# Patient Record
Sex: Male | Born: 1940 | Race: White | Hispanic: No | Smoking: Former smoker
Health system: Southern US, Community
[De-identification: ages and names within clinical notes are randomized; demographics above are authoritative.]

## PROBLEM LIST (undated history)

## (undated) DIAGNOSIS — F0390 Unspecified dementia without behavioral disturbance: Secondary | ICD-10-CM

## (undated) DIAGNOSIS — Z87898 Personal history of other specified conditions: Secondary | ICD-10-CM

## (undated) DIAGNOSIS — I4891 Unspecified atrial fibrillation: Secondary | ICD-10-CM

## (undated) DIAGNOSIS — F32A Depression, unspecified: Secondary | ICD-10-CM

## (undated) DIAGNOSIS — N400 Enlarged prostate without lower urinary tract symptoms: Secondary | ICD-10-CM

## (undated) DIAGNOSIS — K219 Gastro-esophageal reflux disease without esophagitis: Secondary | ICD-10-CM

## (undated) DIAGNOSIS — E785 Hyperlipidemia, unspecified: Secondary | ICD-10-CM

## (undated) DIAGNOSIS — T8859XA Other complications of anesthesia, initial encounter: Secondary | ICD-10-CM

## (undated) DIAGNOSIS — I10 Essential (primary) hypertension: Secondary | ICD-10-CM

## (undated) DIAGNOSIS — M1990 Unspecified osteoarthritis, unspecified site: Secondary | ICD-10-CM

## (undated) DIAGNOSIS — Z860100 Personal history of colon polyps, unspecified: Secondary | ICD-10-CM

## (undated) DIAGNOSIS — F419 Anxiety disorder, unspecified: Secondary | ICD-10-CM

## (undated) DIAGNOSIS — A809 Acute poliomyelitis, unspecified: Secondary | ICD-10-CM

## (undated) DIAGNOSIS — I359 Nonrheumatic aortic valve disorder, unspecified: Secondary | ICD-10-CM

## (undated) DIAGNOSIS — M542 Cervicalgia: Secondary | ICD-10-CM

## (undated) DIAGNOSIS — I4819 Other persistent atrial fibrillation: Secondary | ICD-10-CM

## (undated) DIAGNOSIS — G8929 Other chronic pain: Secondary | ICD-10-CM

## (undated) DIAGNOSIS — H269 Unspecified cataract: Secondary | ICD-10-CM

## (undated) DIAGNOSIS — C61 Malignant neoplasm of prostate: Secondary | ICD-10-CM

## (undated) DIAGNOSIS — R011 Cardiac murmur, unspecified: Secondary | ICD-10-CM

## (undated) HISTORY — DX: Personal history of colon polyps, unspecified: Z86.0100

## (undated) HISTORY — DX: Hyperlipidemia, unspecified: E78.5

## (undated) HISTORY — DX: Benign prostatic hyperplasia without lower urinary tract symptoms: N40.0

## (undated) HISTORY — DX: Unspecified atrial fibrillation: I48.91

## (undated) HISTORY — DX: Unspecified osteoarthritis, unspecified site: M19.90

## (undated) HISTORY — PX: CARDIAC VALVE REPLACEMENT: SHX585

## (undated) HISTORY — DX: Malignant neoplasm of prostate: C61

## (undated) HISTORY — DX: Other chronic pain: G89.29

## (undated) HISTORY — DX: Essential (primary) hypertension: I10

## (undated) HISTORY — DX: Gastro-esophageal reflux disease without esophagitis: K21.9

## (undated) HISTORY — DX: Nonrheumatic aortic valve disorder, unspecified: I35.9

## (undated) HISTORY — DX: Depression, unspecified: F32.A

## (undated) HISTORY — DX: Acute poliomyelitis, unspecified: A80.9

## (undated) HISTORY — DX: Other persistent atrial fibrillation: I48.19

## (undated) HISTORY — DX: Anxiety disorder, unspecified: F41.9

## (undated) HISTORY — PX: EYE SURGERY: SHX253

## (undated) HISTORY — DX: Cardiac murmur, unspecified: R01.1

## (undated) HISTORY — DX: Unspecified cataract: H26.9

## (undated) HISTORY — DX: Personal history of colonic polyps: Z86.010

## (undated) HISTORY — DX: Cervicalgia: M54.2

---

## 1988-10-29 HISTORY — PX: HERNIA REPAIR: SHX51

## 2000-10-29 HISTORY — PX: INSERTION PROSTATE RADIATION SEED: SUR718

## 2012-09-28 HISTORY — PX: AORTIC VALVE REPLACEMENT: SHX41

## 2012-10-09 DIAGNOSIS — I350 Nonrheumatic aortic (valve) stenosis: Secondary | ICD-10-CM | POA: Insufficient documentation

## 2012-10-14 DIAGNOSIS — I509 Heart failure, unspecified: Secondary | ICD-10-CM | POA: Insufficient documentation

## 2012-10-16 DIAGNOSIS — F1010 Alcohol abuse, uncomplicated: Secondary | ICD-10-CM | POA: Insufficient documentation

## 2012-10-17 DIAGNOSIS — Z952 Presence of prosthetic heart valve: Secondary | ICD-10-CM | POA: Insufficient documentation

## 2012-10-17 DIAGNOSIS — E861 Hypovolemia: Secondary | ICD-10-CM | POA: Insufficient documentation

## 2012-10-17 DIAGNOSIS — D62 Acute posthemorrhagic anemia: Secondary | ICD-10-CM | POA: Insufficient documentation

## 2012-10-18 DIAGNOSIS — D689 Coagulation defect, unspecified: Secondary | ICD-10-CM | POA: Insufficient documentation

## 2012-10-18 DIAGNOSIS — R579 Shock, unspecified: Secondary | ICD-10-CM | POA: Insufficient documentation

## 2013-10-30 LAB — HM COLONOSCOPY

## 2016-05-29 LAB — LIPID PANEL
LDL CALC: 69 mg/dL
Triglycerides: 224 mg/dL — AB (ref 40–160)

## 2016-09-28 HISTORY — PX: CARDIOVERSION: SHX1299

## 2017-01-14 ENCOUNTER — Encounter: Payer: Self-pay | Admitting: Family Medicine

## 2017-01-14 ENCOUNTER — Ambulatory Visit (INDEPENDENT_AMBULATORY_CARE_PROVIDER_SITE_OTHER): Payer: Medicare Other | Admitting: Family Medicine

## 2017-01-14 VITALS — BP 130/76 | HR 58 | Temp 98.0°F | Ht 69.0 in | Wt 188.6 lb

## 2017-01-14 DIAGNOSIS — E785 Hyperlipidemia, unspecified: Secondary | ICD-10-CM

## 2017-01-14 DIAGNOSIS — D689 Coagulation defect, unspecified: Secondary | ICD-10-CM

## 2017-01-14 DIAGNOSIS — I4819 Other persistent atrial fibrillation: Secondary | ICD-10-CM

## 2017-01-14 DIAGNOSIS — G8929 Other chronic pain: Secondary | ICD-10-CM | POA: Diagnosis not present

## 2017-01-14 DIAGNOSIS — N401 Enlarged prostate with lower urinary tract symptoms: Secondary | ICD-10-CM

## 2017-01-14 DIAGNOSIS — L719 Rosacea, unspecified: Secondary | ICD-10-CM

## 2017-01-14 DIAGNOSIS — C61 Malignant neoplasm of prostate: Secondary | ICD-10-CM | POA: Diagnosis not present

## 2017-01-14 DIAGNOSIS — N138 Other obstructive and reflux uropathy: Secondary | ICD-10-CM | POA: Diagnosis not present

## 2017-01-14 DIAGNOSIS — M542 Cervicalgia: Secondary | ICD-10-CM | POA: Diagnosis not present

## 2017-01-14 DIAGNOSIS — M47812 Spondylosis without myelopathy or radiculopathy, cervical region: Secondary | ICD-10-CM | POA: Insufficient documentation

## 2017-01-14 DIAGNOSIS — M2550 Pain in unspecified joint: Secondary | ICD-10-CM | POA: Insufficient documentation

## 2017-01-14 DIAGNOSIS — I509 Heart failure, unspecified: Secondary | ICD-10-CM

## 2017-01-14 DIAGNOSIS — Z8546 Personal history of malignant neoplasm of prostate: Secondary | ICD-10-CM | POA: Insufficient documentation

## 2017-01-14 DIAGNOSIS — I481 Persistent atrial fibrillation: Secondary | ICD-10-CM

## 2017-01-14 DIAGNOSIS — K219 Gastro-esophageal reflux disease without esophagitis: Secondary | ICD-10-CM | POA: Insufficient documentation

## 2017-01-14 LAB — POCT INR: INR: 4.8

## 2017-01-14 NOTE — Progress Notes (Signed)
Pre visit review using our clinic review tool, if applicable. No additional management support is needed unless otherwise documented below in the visit note. 

## 2017-01-14 NOTE — Patient Instructions (Addendum)
MON TUES WED THURS FRI SAT SUN  HOLD 2.5 5 5 5  2.5 5

## 2017-01-14 NOTE — Progress Notes (Signed)
Marcus Galloway is a 76 y.o. male is here to [employer].   History of Present Illness:   Water quality scientist, CMA, acting as scribe for Dr. [name].  CC: Patient is here to establish care.  Recently moved here from [facility], [place].  Patient has persistent atrial fibrillation and is currently taking warfarin for this.  No complaints today.  Would like INR checked.  HPI:  1. Persistent atrial fibrillation ([place]). On Coumadin. Started four weeks ago per patient. Needs INR checked today. Cardiovascular ROS: negative for - chest pain, dyspnea on exertion, edema or rapid heart rate. Neurological ROS: no TIA or stroke symptoms. Hematological and Lymphatic ROS: negative for - bleeding problems, bruising, jaundice, night sweats, pallor or weight loss.   Health Maintenance Due  Topic Date Due  . PNA vac Low Risk Adult (1 of 2 - PCV13) 05/23/2005    PMHx, SurgHx, SocialHx, Medications, and Allergies were reviewed in the Visit Navigator and updated as appropriate.   Past Medical History:  Diagnosis Date  . Aortic valve disease   . Atrial fibrillation ([place])   . BPH (benign prostatic hyperplasia)   . Chronic neck pain   . GERD (gastroesophageal reflux disease)   . History of colonic polyps   . Hyperlipidemia   . Hypertension   . Osteoarthritis   . Persistent atrial fibrillation ([place])   . Polio   . Prostate cancer [facility])    Past Surgical History:  Procedure Laterality Date  . 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  . CARDIOVERSION  09/2016  . HERNIA REPAIR  1990  . INSERTION PROSTATE RADIATION SEED  2002   Family History  Problem Relation Age of Onset  . Adopted: Yes  . Family history unknown: Yes   Social History  Substance Use Topics  . Smoking status: Former Research scientist (life sciences)  . Smokeless tobacco: Never Used  . Alcohol use Yes     Comment: 1-2 drinks 5-6 nights per week   Current Medications and Allergies:   .  amLODipine (NORVASC) 5 MG tablet, Take 5 mg by mouth  daily., Disp: , Rfl:  .  atorvastatin (LIPITOR) 10 MG tablet, Take 10 mg by mouth daily., Disp: , Rfl:  .  doxycycline (ADOXA) 50 MG tablet, Take 50 mg by mouth daily., Disp: , Rfl:  .  metoprolol (LOPRESSOR) 50 MG tablet, Take 75 mg by mouth 2 (two) times daily., Disp: , Rfl:  .  OMEPRAZOLE PO, Take 20 mg by mouth daily., Disp: , Rfl:  .  oxyCODONE-acetaminophen (PERCOCET/ROXICET) 5-325 MG tablet, Take 1 tablet by mouth every 8 (eight) hours as needed for severe pain., Disp: , Rfl:  .  tamsulosin (FLOMAX) 0.4 MG CAPS capsule, Take 0.4 mg by mouth daily., Disp: , Rfl:  .  warfarin (COUMADIN) 5 MG tablet, Take 5 mg by mouth daily., Disp: , Rfl:   No Known Allergies   Review of Systems:   Review of Systems  Constitutional: Negative for chills and fever.  HENT: Negative for congestion.   Eyes: Negative for blurred vision.  Respiratory: Negative for cough.   Cardiovascular: Negative for chest pain and palpitations.  Gastrointestinal: Negative for abdominal pain, nausea and vomiting.  Genitourinary: Negative for frequency.  Musculoskeletal: Positive for neck pain.       Chronic.  Skin: Negative for rash.  Neurological: Positive for dizziness. Negative for loss of consciousness and headaches.       Intermittent dizziness.   Vitals:   Vitals:  01/14/17 1312  BP: 130/76  Pulse: (!) 58  Temp: 98 F (36.7 C)  TempSrc: Oral  SpO2: 96%  Weight: 188 lb 9.6 oz (85.5 kg)  Height: 5\' 9"  (1.753 m)     Body mass index is 27.85 kg/m.  Physical Exam:   Physical Exam  Constitutional: He is oriented to person, place, and time. He appears well-developed and well-nourished. No distress.  HENT:  Head: Normocephalic and atraumatic.  Eyes: Pupils are equal, round, and reactive to light.  Neck: No thyromegaly present.  Cardiovascular: Normal rate.  An irregularly irregular rhythm present.  Pulmonary/Chest: Effort normal.  Abdominal: Soft.  Neurological: He is alert and oriented to person,  place, and time.  Skin: Skin is warm.  Psychiatric: He has a normal mood and affect. His behavior is normal. Judgment and thought content normal.   Results for orders placed or performed in visit on 01/14/17  Lipid panel  Result Value Ref Range   Triglycerides 224 (A) 40 - 160 mg/dL   LDL Cholesterol 69 mg/dL  POCT INR  Result Value Ref Range   INR 4.8   HM COLONOSCOPY  Result Value Ref Range   HM Colonoscopy Patient Reported See Report (in chart), Patient Reported   Assessment and Plan:   Marcus Galloway was seen today for establish care and medication management.  Diagnoses and all orders for this visit:  Persistent atrial fibrillation ([place]) Comments: INR supratherapeutic today. See AVS for adjustment. Recheck in one week. Orders: -     POCT INR -     Ambulatory referral to Cardiology -     POCT INR; Standing  Hyperlipidemia, unspecified hyperlipidemia type Comments: Stable on Lipitor daily.  Rosacea Comments: Stable on Doxycycline daily.  Chronic neck pain Comments: Stable on prn Percocet.  Prostate cancer [facility]) Comments: s/p seeding.   Coagulopathy ([place]) Comments: See above Coumadin adjustment.  Congestive heart failure ([place]) Comments: Stable on current regimen. To Cardiology to establish.   . Reviewed expectations re: course of current medical issues. . Discussed self-management of symptoms. . Outlined signs and symptoms indicating need for more acute intervention. . Patient verbalized understanding and all questions were answered. . See orders for this visit as documented in the electronic medical record. . Patient received an After Visit Summary.  Records requested if needed. I spent 45 minutes with this patient, greater than 50% was face-to-face time counseling regarding the above diagnoses.  CMA served as Education administrator during this visit. History, Physical, and Plan performed by medical provider. Documentation and orders reviewed and attested to. Briscoe Deutscher,  D.O.  Briscoe Deutscher, [place], [place] 01/14/2017   Follow-up: No Follow-up on file.  Meds ordered this encounter  Medications  . tamsulosin (FLOMAX) 0.4 MG CAPS capsule    Sig: Take 0.4 mg by mouth daily.  Marland Kitchen OMEPRAZOLE PO    Sig: Take 20 mg by mouth daily.  Marland Kitchen warfarin (COUMADIN) 5 MG tablet    Sig: Take 5 mg by mouth daily.  Marland Kitchen doxycycline (ADOXA) 50 MG tablet    Sig: Take 50 mg by mouth daily.  Marland Kitchen oxyCODONE-acetaminophen (PERCOCET/ROXICET) 5-325 MG tablet    Sig: Take 1 tablet by mouth every 8 (eight) hours as needed for severe pain.  Marland Kitchen DISCONTD: Metoprolol Tartrate 75 MG TABS    Sig: Take 1 tablet by mouth 2 (two) times daily.  Marland Kitchen atorvastatin (LIPITOR) 10 MG tablet    Sig: Take 10 mg by mouth daily.  Marland Kitchen amLODipine (NORVASC) 5 MG tablet  Sig: Take 5 mg by mouth daily.  . metoprolol (LOPRESSOR) 50 MG tablet    Sig: Take 75 mg by mouth 2 (two) times daily.   Medications Discontinued During This Encounter  Medication Reason  . Metoprolol Tartrate 75 MG TABS Dose change   Orders Placed This Encounter  Procedures  . Lipid panel  . POCT INR  . HM COLONOSCOPY

## 2017-01-15 DIAGNOSIS — N138 Other obstructive and reflux uropathy: Secondary | ICD-10-CM | POA: Insufficient documentation

## 2017-01-15 DIAGNOSIS — N401 Enlarged prostate with lower urinary tract symptoms: Secondary | ICD-10-CM | POA: Insufficient documentation

## 2017-01-16 ENCOUNTER — Encounter: Payer: Self-pay | Admitting: Cardiovascular Disease

## 2017-01-16 ENCOUNTER — Ambulatory Visit (INDEPENDENT_AMBULATORY_CARE_PROVIDER_SITE_OTHER): Payer: Medicare Other | Admitting: Cardiovascular Disease

## 2017-01-16 VITALS — BP 120/80 | HR 84 | Ht 69.0 in | Wt 187.2 lb

## 2017-01-16 DIAGNOSIS — E785 Hyperlipidemia, unspecified: Secondary | ICD-10-CM

## 2017-01-16 DIAGNOSIS — Z952 Presence of prosthetic heart valve: Secondary | ICD-10-CM

## 2017-01-16 DIAGNOSIS — I509 Heart failure, unspecified: Secondary | ICD-10-CM

## 2017-01-16 DIAGNOSIS — I481 Persistent atrial fibrillation: Secondary | ICD-10-CM | POA: Diagnosis not present

## 2017-01-16 DIAGNOSIS — Z8612 Personal history of poliomyelitis: Secondary | ICD-10-CM

## 2017-01-16 DIAGNOSIS — K219 Gastro-esophageal reflux disease without esophagitis: Secondary | ICD-10-CM | POA: Diagnosis not present

## 2017-01-16 DIAGNOSIS — Z79899 Other long term (current) drug therapy: Secondary | ICD-10-CM | POA: Diagnosis not present

## 2017-01-16 DIAGNOSIS — Z8546 Personal history of malignant neoplasm of prostate: Secondary | ICD-10-CM | POA: Diagnosis not present

## 2017-01-16 DIAGNOSIS — I4819 Other persistent atrial fibrillation: Secondary | ICD-10-CM

## 2017-01-16 MED ORDER — DILTIAZEM HCL ER COATED BEADS 180 MG PO CP24: 180.0000 mg | ORAL_CAPSULE | Freq: Every day | ORAL | 6 refills | Status: DC

## 2017-01-16 NOTE — Progress Notes (Signed)
Cardiology Office Note    Date:  01/22/2017   ID:  Marcus Galloway, DOB [birth year], MRN [id number]  PCP:  Briscoe Deutscher, DO  Cardiologist:  Shelva Majestic, MD   Chief Complaint  Patient presents with  . New Patient (Initial Visit)    History of Present Illness:  Marcus Galloway is a 76 y.o. male Caucasian male who moved to [place] one week ago from [place] Kitchen  He presents to the office today to establish cardiology care with me.  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  He was found to be in atrial fibrillation on routine office visit in November 2017.  Rate control and anticoagulation were initiated at that time and ultimately underwent cardioversion.  He develop recurrent atrial fibrillation and underwent sotalol loading with repeat cardioversion.  An echo Doppler study had shown normal LV size and function with an EF of 50-55%, biatrial enlargement, mild-to-moderate MR, moderate TR, and a normal prosthetic aortic valve.  When last seen by his cardiologist, Dr. Kellie Simmering in  [facility] [place] on 12/10/2016 , he was back in atrial fibrillation.  At that time, his eloquence was discontinued as was sotalol and he was started on metoprolol, tartrate 75 mg twice a day and Coumadin for anticoagulation.  He has subsequently moved to [place] last week and presents to the office today to establish care with me.  He denies any episodes of chest pain.  He reportedly had undergone cardiac catheterization prior to his valve replacement and his coronary arteries were clean.  Recently, he has noticed exertional dyspnea.  He is unaware of rapid heartbeats on his current dose of metoprolol.  He admits to adequate sleep.  He is unaware of awakening gasping for breath, and he believes his sleep is restorative.   Past Medical History:    Diagnosis Date  . Aortic valve disease   . Atrial fibrillation ([place])   . BPH (benign prostatic hyperplasia)   . Chronic neck pain   . GERD (gastroesophageal reflux disease)   . History of colonic polyps   . Hyperlipidemia   . Hypertension   . Osteoarthritis   . Persistent atrial fibrillation ([place])   . Polio   . Prostate cancer [facility])     Past Surgical History:  Procedure Laterality Date  . 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  . CARDIOVERSION  09/2016  . HERNIA REPAIR  1990  . INSERTION PROSTATE RADIATION SEED  2002    Current Medications: Outpatient Medications Prior to Visit  Medication Sig Dispense Refill  . atorvastatin (LIPITOR) 10 MG tablet Take 10 mg by mouth daily.    Marland Kitchen doxycycline (ADOXA) 50 MG tablet Take 50 mg by mouth daily.    . metoprolol (LOPRESSOR) 50 MG tablet Take 75 mg by mouth 2 (two) times daily.    Marland Kitchen OMEPRAZOLE PO Take 20 mg by mouth daily.    Marland Kitchen oxyCODONE-acetaminophen (PERCOCET/ROXICET) 5-325 MG tablet Take 1 tablet by mouth every 8 (eight) hours as needed for severe pain.    . tamsulosin (FLOMAX) 0.4 MG CAPS capsule Take 0.4 mg by mouth daily.    Marland Kitchen warfarin (COUMADIN) 5 MG tablet Take 5 mg by mouth daily.    Marland Kitchen amLODipine (NORVASC) 5 MG tablet Take 5 mg by mouth daily.     No facility-administered medications prior to visit.  Allergies:   Patient has no known allergies.   Social History   Social History  . Marital status: Widowed    Spouse name: N/A  . Number of children: N/A  . Years of education: N/A   Social History Main Topics  . Smoking status: Former Research scientist (life sciences)  . Smokeless tobacco: Never Used  . Alcohol use Yes     Comment: 1-2 drinks 5-6 nights per week  . Drug use: No  . Sexual activity: Not Currently   Other Topics Concern  . None   Social History Narrative  . None    Social history is notable in that he is widowed.  He is moved back here to be close to his children.  He had smoked remotely from age  75 until age 89 but quit in 39.  He has 4 children.  One daughter had undergone aortic valve replacement.  He has a half-sister but her whereabouts is unknown.  He drinks occasional alcohol with wine or scotch.   Family History:  He was adopted. Family history is unknown by patient.  However, his biological parents are deceased and he was able to find through records that his mother died at age 57 and his father died at age 46.  ROS General: Negative; No fevers, chills, or night sweats;  HEENT: Negative; No changes in vision or hearing, sinus congestion, difficulty swallowing Pulmonary: Negative; No cough, wheezing, shortness of breath, hemoptysis Cardiovascular: see HPI GI: History of GERD on omeprazole GU: He has a history of prostate CA and underwent radiation seeds in 2001. Musculoskeletal: Negative; no myalgias, joint pain , or weakness/  he had polio at age 24-6 and was in the hospital for 5 months. Hematologic/Oncology: Negative; no easy bruising, bleeding Endocrine: Negative; no heat/cold intolerance; no diabetes Neuro: Negative; no changes in balance, headaches Skin: Negative; No rashes or skin lesions Psychiatric: Negative; No behavioral problems, depression Sleep: Negative; No snoring, daytime sleepiness, hypersomnolence, bruxism, restless legs, hypnogognic hallucinations, no cataplexy Other comprehensive 14 point system review is negative.   PHYSICAL EXAM:   VS:  BP 120/80   Pulse 84   Ht 5\' 9"  (1.753 m)   Wt 187 lb 3.2 oz (84.9 kg)   BMI 27.64 kg/m    Wt Readings from Last 3 Encounters:  01/16/17 187 lb 3.2 oz (84.9 kg)  01/14/17 188 lb 9.6 oz (85.5 kg)    General: Alert, oriented, no distress.  Skin: normal turgor, no rashes, warm and dry HEENT: Normocephalic, atraumatic. Pupils equal round and reactive to light; sclera anicteric; extraocular muscles intact; Fundi no hemorrhages or exudates, disc flat  Nose without nasal septal hypertrophy Mouth/Parynx benign;  Mallinpatti scale Neck: No JVD, no carotid bruits; normal carotid upstroke Lungs: clear to ausculatation and percussion; no wheezing or rales Chest wall: without tenderness to palpitation Heart: PMI not displaced, RRR, s1 s2 normal, 1/6 systolic murmur, no diastolic murmur, no rubs, gallops, thrills, or heaves Abdomen: soft, nontender; no hepatosplenomehaly, BS+; abdominal aorta nontender and not dilated by palpation. Back: no CVA tenderness Pulses 2+ Musculoskeletal: full range of motion, normal strength, no joint deformities Extremities: no clubbing cyanosis or edema, Homan's sign negative  Neurologic: grossly nonfocal; Cranial nerves grossly wnl Psychologic: Normal mood and affect   Studies/Labs Reviewed:   EKG:  EKG is ordered today. ECG (independently read by me): Atrial fibrillation at a rate of 84 bpm.  Left anterior hemiblock.  Poor progression V1 through V3.  No significant ST segment changes.  Recent Labs:  No flowsheet data found.   No flowsheet data found.  No flowsheet data found. No results found for: MCV No results found for: TSH No results found for: HGBA1C   BNP No results found for: BNP  ProBNP No results found for: PROBNP   Lipid Panel     Component Value Date/Time   TRIG 224 (A) 05/29/2016   LDLCALC 69 05/29/2016     RADIOLOGY: No results found.   Additional studies/ records that were reviewed today include:  I reviewed the office records from Dr. Bunnie Philips at  [facility] in [place], [place].  The echo was reviewed    ASSESSMENT:    1. Persistent atrial fibrillation ([place])   2. S/P aortic valve replacement   3. Congestive heart failure, NYHA class 2, unspecified congestive heart failure type ([place])   4. Hyperlipidemia, unspecified hyperlipidemia type   5. Medication management   6. Gastroesophageal reflux disease without esophagitis   7. History of prostate cancer   8. Personal history of poliomyelitis       PLAN:  Marcus Galloway is a 77 year old gentleman who unaware of his family history as he was abandoned by his biologic parents and sent to a foster home.  He underwent aortic valve replacement surgery which I presume was aortic stenosis and had a 25 mmEdwards pericardial valve inserted.  At the time of his surgery, he had normal coronary arteries.  6 months, he has had recurrent episodes of atrial fibrillation despite undergoing cardioversion on 2 occasions.  Ultimately sotalol was discontinued and he is now in persistent/permanent atrial fibrillation.  He is now on warfarin for anticoagulation therapy and denies any bleeding.  His ECG today shows atrial fibrillation with a ventricular rate in the 80s.  His blood pressure is stable.  I am suggesting that he discontinue amlodipine, diltiazem CD 180 mg, which should provide additional rate control.  He continues to be on metoprolol 75 mg twice a day.  INR goal is 2.5-3.  I am recommending fasting lab work be obtained.  He continues to be on atorvastatin 10 mg.  He has history of GERD and is on omeprazole.  He has remote history of prostate cancer and underwent radiation seed implantation Medications will be adjusted accordingly.  Prior to his follow-up office visit, I am suggesting he undergo a follow-up echo Doppler study to reassess his systolic and diastolic function, aortic bioprosthesis, and valvular architecture.  I will see him in several months for reevaluation.  Medication Adjustments/Labs and Tests Ordered: Current medicines are reviewed at length with the patient today.  Concerns regarding medicines are outlined above.  Medication changes, Labs and Tests ordered today are listed in the Patient Instructions below. Patient Instructions  Your physician has requested that you have an echocardiogram. Echocardiography is a painless test that uses sound waves to create images of your heart. It provides your doctor with information about the size  and shape of your heart and how well your heart's chambers and valves are working. This procedure takes approximately one hour. There are no restrictions for this procedure.  This will be done at the [place].  Your physician has recommended you make the following change in your medication: STOP THE AMLODIPINE. This has been replaced with diltiazem 180 mg.   Your physician recommends that you return for lab work fasting. Lab orders provided to you today.  Your physician recommends that you schedule a follow-up appointment in: 2  months with Dr Claiborne Billings.     Signed, Shelva Majestic, MD, [facility]  01/22/2017 [facility] [address], [place], [place], [place]  [address] Phone: [phone]

## 2017-01-16 NOTE — Patient Instructions (Addendum)
Your physician has requested that you have an echocardiogram. Echocardiography is a painless test that uses sound waves to create images of your heart. It provides your doctor with information about the size and shape of your heart and how well your heart's chambers and valves are working. This procedure takes approximately one hour. There are no restrictions for this procedure.  This will be done at the [place].  Your physician has recommended you make the following change in your medication: STOP THE AMLODIPINE. This has been replaced with diltiazem 180 mg.   Your physician recommends that you return for lab work fasting. Lab orders provided to you today.  Your physician recommends that you schedule a follow-up appointment in: 2 months with Dr Claiborne Billings.

## 2017-01-21 ENCOUNTER — Telehealth: Payer: Self-pay

## 2017-01-21 ENCOUNTER — Other Ambulatory Visit (INDEPENDENT_AMBULATORY_CARE_PROVIDER_SITE_OTHER): Payer: Medicare Other

## 2017-01-21 DIAGNOSIS — I481 Persistent atrial fibrillation: Secondary | ICD-10-CM

## 2017-01-21 DIAGNOSIS — I4819 Other persistent atrial fibrillation: Secondary | ICD-10-CM

## 2017-01-21 LAB — POCT INR: INR: 2.6

## 2017-01-21 NOTE — Telephone Encounter (Signed)
Spoke with patient regarding INR results.  Per Dr. [name], patient to take 2.5 mg Coumadin on Tuesday and Saturday this week and 5 mg the rest of the days this week.  Patient was notified of this and verbalized understanding.

## 2017-01-28 ENCOUNTER — Other Ambulatory Visit: Payer: Self-pay | Admitting: Surgical

## 2017-01-28 ENCOUNTER — Telehealth: Payer: Self-pay | Admitting: Surgical

## 2017-01-28 ENCOUNTER — Other Ambulatory Visit (INDEPENDENT_AMBULATORY_CARE_PROVIDER_SITE_OTHER): Payer: Medicare Other

## 2017-01-28 DIAGNOSIS — I481 Persistent atrial fibrillation: Secondary | ICD-10-CM

## 2017-01-28 DIAGNOSIS — I4819 Other persistent atrial fibrillation: Secondary | ICD-10-CM

## 2017-01-28 LAB — POCT INR: INR: 3

## 2017-01-28 MED ORDER — OXYCODONE-ACETAMINOPHEN 5-325 MG PO TABS: 1.0000 | ORAL_TABLET | Freq: Three times a day (TID) | ORAL | 0 refills | Status: DC | PRN

## 2017-01-28 NOTE — Telephone Encounter (Signed)
Printed RX and called patient to come pick up.

## 2017-01-28 NOTE — Telephone Encounter (Signed)
Patient came in today for labs and is needing a refill for his Oxycodone. Patient said that he can come back to pick up.

## 2017-01-28 NOTE — Telephone Encounter (Signed)
Per Dr. [name] printed

## 2017-02-01 ENCOUNTER — Ambulatory Visit (HOSPITAL_COMMUNITY): Payer: Medicare Other | Attending: Internal Medicine

## 2017-02-01 ENCOUNTER — Other Ambulatory Visit: Payer: Self-pay

## 2017-02-01 DIAGNOSIS — I509 Heart failure, unspecified: Secondary | ICD-10-CM | POA: Diagnosis not present

## 2017-02-01 DIAGNOSIS — Z952 Presence of prosthetic heart valve: Secondary | ICD-10-CM

## 2017-02-04 ENCOUNTER — Ambulatory Visit (INDEPENDENT_AMBULATORY_CARE_PROVIDER_SITE_OTHER): Payer: Medicare Other | Admitting: General Practice

## 2017-02-04 ENCOUNTER — Other Ambulatory Visit (INDEPENDENT_AMBULATORY_CARE_PROVIDER_SITE_OTHER): Payer: Medicare Other | Admitting: *Deleted

## 2017-02-04 DIAGNOSIS — I4819 Other persistent atrial fibrillation: Secondary | ICD-10-CM

## 2017-02-04 DIAGNOSIS — I481 Persistent atrial fibrillation: Secondary | ICD-10-CM | POA: Diagnosis not present

## 2017-02-04 LAB — POCT INR: INR: 2.2

## 2017-02-04 NOTE — Progress Notes (Signed)
Pre visit review using our clinic review tool, if applicable. No additional management support is needed unless otherwise documented below in the visit note. 

## 2017-02-04 NOTE — Addendum Note (Signed)
Addended by: Precious Gilding on: 02/04/2017 11:39 AM   Modules accepted: Level of Service

## 2017-02-04 NOTE — Patient Instructions (Signed)
Pre visit review using our clinic review tool, if applicable. No additional management support is needed unless otherwise documented below in the visit note. 

## 2017-02-11 ENCOUNTER — Other Ambulatory Visit: Payer: Medicare Other

## 2017-02-14 ENCOUNTER — Ambulatory Visit (INDEPENDENT_AMBULATORY_CARE_PROVIDER_SITE_OTHER): Payer: Medicare Other | Admitting: *Deleted

## 2017-02-14 ENCOUNTER — Ambulatory Visit (INDEPENDENT_AMBULATORY_CARE_PROVIDER_SITE_OTHER): Payer: Medicare Other | Admitting: Family Medicine

## 2017-02-14 ENCOUNTER — Encounter: Payer: Self-pay | Admitting: Family Medicine

## 2017-02-14 VITALS — BP 124/78 | HR 50 | Temp 98.1°F | Ht 69.0 in | Wt 187.0 lb

## 2017-02-14 DIAGNOSIS — R42 Dizziness and giddiness: Secondary | ICD-10-CM | POA: Diagnosis not present

## 2017-02-14 DIAGNOSIS — Z5181 Encounter for therapeutic drug level monitoring: Secondary | ICD-10-CM

## 2017-02-14 DIAGNOSIS — G8929 Other chronic pain: Secondary | ICD-10-CM

## 2017-02-14 DIAGNOSIS — M545 Low back pain, unspecified: Secondary | ICD-10-CM

## 2017-02-14 LAB — POCT INR: INR: 4

## 2017-02-14 MED ORDER — OXYCODONE-ACETAMINOPHEN 5-325 MG PO TABS: 1.0000 | ORAL_TABLET | Freq: Two times a day (BID) | ORAL | 0 refills | Status: DC | PRN

## 2017-02-14 NOTE — Progress Notes (Signed)
Pre visit review using our clinic review tool, if applicable. No additional management support is needed unless otherwise documented below in the visit note. 

## 2017-02-14 NOTE — Progress Notes (Signed)
Marcus Galloway is a 76 y.o. male is here to discuss:  History of Present Illness:   Water quality scientist, CMA, acting as scribe for Dr. [name].  Chief Complaint  Patient presents with  . Follow-up  . Atrial Fibrillation   HPI:  1. Marcus Galloway complains of dizziness. The dizziness has been present for several months. Marcus Galloway describes the symptoms as disequalibirum. Symptoms are exacerbated by looking  downward, rising from squatting or sitting position and bending.Marland Kitchen Marcus Galloway denies aural pressure, otalgia, otorrhea, tinnitus, and earing loss.  Marcus Galloway has been treated with nothing.   2. Chronic midline low back pain without sciatica.  Controlled with current Roxicet daily to BID. No noted side effects.   Health Maintenance Due  Topic Date Due  . PNA vac Low Risk Adult (1 of 2 - PCV13) 05/23/2005   PMHx, SurgHx, SocialHx, FamHx, Medications, and Allergies were reviewed in the Visit Navigator and updated as appropriate.   Patient Active Problem List   Diagnosis Date Noted  . BPH with obstruction/lower urinary tract symptoms 01/15/2017  . Arthritis 01/14/2017  . GERD (gastroesophageal reflux disease) 01/14/2017  . Prostate cancer ([place]) 01/14/2017  . Persistent atrial fibrillation ([place]) 01/14/2017  . Rosacea 01/14/2017  . Chronic neck pain 01/14/2017  . Hyperlipidemia   . Coagulopathy ([place]) 10/18/2012  . S/P aortic valve replacement 10/17/2012  . ETOH abuse 10/16/2012  . CHF NYHA class II ([place]) 10/14/2012   Social History  Substance Use Topics  . Smoking status: Former Research scientist (life sciences)  . Smokeless tobacco: Never Used  . Alcohol use Yes     Comment: 1-2 drinks 5-6 nights per week   Current Medications and Allergies:   .  atorvastatin (LIPITOR) 10 MG tablet, Take 10 mg by mouth daily., Disp: , Rfl:  .  diltiazem (CARDIZEM CD) 180 MG 24 hr capsule, Take 1 capsule (180 mg total) by mouth daily., Disp: 30 capsule, Rfl: 6 .  doxycycline (ADOXA) 50 MG tablet, Take 50 mg by mouth daily., Disp: , Rfl:  .  metoprolol  (LOPRESSOR) 50 MG tablet, Take 75 mg by mouth 2 (two) times daily., Disp: , Rfl:  .  OMEPRAZOLE PO, Take 20 mg by mouth daily., Disp: , Rfl:  .  oxyCODONE-acetaminophen (PERCOCET/ROXICET) 5-325 MG tablet, Take 1 tablet by mouth every 8 (eight) hours as needed for severe pain., Disp: 30 tablet, Rfl: 0 .  tamsulosin (FLOMAX) 0.4 MG CAPS capsule, Take 0.4 mg by mouth daily., Disp: , Rfl:  .  warfarin (COUMADIN) 5 MG tablet, Take 5 mg by mouth daily., Disp: , Rfl:   No Known Allergies   Review of Systems   Review of Systems  Constitutional: Negative for chills and fever.  HENT: Negative for congestion and sore throat.   Eyes: Negative for blurred vision.  Respiratory: Negative for cough and shortness of breath.   Cardiovascular: Negative for chest pain and palpitations.  Gastrointestinal: Negative for abdominal pain, nausea and vomiting.  Genitourinary: Negative for frequency.  Musculoskeletal: Positive for neck pain.  Skin: Negative for rash.  Neurological: Positive for dizziness. Negative for headaches.  Psychiatric/Behavioral: Negative for depression. The patient is not nervous/anxious.    Vitals:   Vitals:   02/14/17 1132  BP: 124/78  Pulse: (!) 50  Temp: 98.1 F (36.7 C)  TempSrc: Oral  SpO2: 97%  Weight: 187 lb (84.8 kg)  Height: 5\' 9"  (1.753 m)     Body mass index is 27.62 kg/m.   Physical Exam:    Physical Exam  Constitutional: Marcus Galloway appears well-developed and well-nourished. No distress.  HENT:  Head: Normocephalic and atraumatic.  Right Ear: External ear normal.  Left Ear: External ear normal.  Nose: Nose normal.  Mouth/Throat: Oropharynx is clear and moist.  Eyes: Conjunctivae and EOM are normal. Pupils are equal, round, and reactive to light.  Neck: Normal range of motion. Neck supple.  Cardiovascular: Normal rate and intact distal pulses.   Pulmonary/Chest: Effort normal and breath sounds normal.  Abdominal: Soft. Bowel sounds are normal.  Musculoskeletal:  Normal range of motion.  Neurological: Marcus Galloway displays normal reflexes. No cranial nerve deficit or sensory deficit. Marcus Galloway exhibits normal muscle tone. Coordination normal.  Skin: Skin is warm and dry.  Psychiatric: Marcus Galloway has a normal mood and affect. His behavior is normal. Judgment and thought content normal.  Nursing note and vitals reviewed.    Assessment and Plan:    Marcus Galloway was seen today for follow-up and atrial fibrillation.  Diagnoses and all orders for this visit:  Vertigo Comments: Chronic. Happening daily, sometimes when driving.  Orders: -     Ambulatory referral to Neurology  Chronic midline low back pain without sciatica -     oxyCODONE-acetaminophen (ROXICET) 5-325 MG tablet; Take 1 tablet by mouth 2 (two) times daily as needed for severe pain.    . Reviewed expectations re: course of current medical issues. . Discussed self-management of symptoms. . Outlined signs and symptoms indicating need for more acute intervention. . Patient verbalized understanding and all questions were answered. . See orders for this visit as documented in the electronic medical record. . Patient received an After Visit Summary.   CMA served as Education administrator during this visit. History, Physical, and Plan performed by medical provider. Documentation and orders reviewed and attested to. Briscoe Deutscher, D.O.  Briscoe Deutscher, [place] [place] 02/20/2017  Follow-up: Return in about 3 months (around 05/16/2017).

## 2017-02-14 NOTE — Patient Instructions (Signed)
Pre visit review using our clinic review tool, if applicable. No additional management support is needed unless otherwise documented below in the visit note. 

## 2017-02-18 ENCOUNTER — Other Ambulatory Visit: Payer: Medicare Other

## 2017-02-19 ENCOUNTER — Telehealth: Payer: Self-pay | Admitting: Family Medicine

## 2017-02-19 NOTE — Telephone Encounter (Signed)
Incoming records from [facility], Amaryllis Dyke MD. 9 Pages

## 2017-02-26 ENCOUNTER — Telehealth: Payer: Self-pay | Admitting: Cardiovascular Disease

## 2017-02-26 LAB — CBC
HEMATOCRIT: 43.7 % (ref 38.5–50.0)
HEMOGLOBIN: 14.5 g/dL (ref 13.2–17.1)
MCH: 31.2 pg (ref 27.0–33.0)
MCHC: 33.2 g/dL (ref 32.0–36.0)
MCV: 94 fL (ref 80.0–100.0)
MPV: 9.7 fL (ref 7.5–12.5)
Platelets: 211 10*3/uL (ref 140–400)
RBC: 4.65 MIL/uL (ref 4.20–5.80)
RDW: 13.5 % (ref 11.0–15.0)
WBC: 7.2 10*3/uL (ref 3.8–10.8)

## 2017-02-26 NOTE — Telephone Encounter (Signed)
Returned the call to the patient. He stated that last night the "sides of his ankles were puffy." He stated that this was new for him. He denied shortness of breath and has not had any weight gain. He did state that he sits in a chair with his legs dangled down for long periods of time. He was advised to elevate his legs and to get up and walk around more throughout the day. He was instructed to call us if the swelling became worse or if he developed any shortness of breath. He verbalized his understanding. The patient does have an appointment of 5/22. Will route to the provider for further recommendation.

## 2017-02-26 NOTE — Telephone Encounter (Signed)
Pt c/o swelling: STAT is pt has developed SOB within 24 hours  1. How long have you been experiencing swelling? Since last night  2. Where is the swelling located? Bilateral ankles  3.  Are you currently taking a "fluid pill"?Pt don't know  4.  Are you currently SOB? No  5.  Have you traveled recently?no

## 2017-02-27 LAB — COMPREHENSIVE METABOLIC PANEL
ALK PHOS: 90 U/L (ref 40–115)
ALT: 21 U/L (ref 9–46)
AST: 25 U/L (ref 10–35)
Albumin: 4.1 g/dL (ref 3.6–5.1)
BILIRUBIN TOTAL: 0.8 mg/dL (ref 0.2–1.2)
BUN: 8 mg/dL (ref 7–25)
CALCIUM: 9.1 mg/dL (ref 8.6–10.3)
CO2: 26 mmol/L (ref 20–31)
Chloride: 104 mmol/L (ref 98–110)
Creat: 0.78 mg/dL (ref 0.70–1.18)
GLUCOSE: 101 mg/dL — AB (ref 65–99)
POTASSIUM: 4.4 mmol/L (ref 3.5–5.3)
Sodium: 142 mmol/L (ref 135–146)
TOTAL PROTEIN: 6.4 g/dL (ref 6.1–8.1)

## 2017-02-27 LAB — LIPID PANEL
CHOLESTEROL: 151 mg/dL (ref <200)
HDL: 57 mg/dL (ref >40)
LDL Cholesterol: 68 mg/dL (ref <100)
Total CHOL/HDL Ratio: 2.6 Ratio (ref <5.0)
Triglycerides: 130 mg/dL (ref <150)
VLDL: 26 mg/dL (ref <30)

## 2017-02-27 LAB — TSH: TSH: 1.89 mIU/L (ref 0.40–4.50)

## 2017-02-27 NOTE — Telephone Encounter (Signed)
Called the patient back. There wasn't answer.

## 2017-02-27 NOTE — Telephone Encounter (Signed)
Would avoid sodium; consider compression socks

## 2017-02-28 ENCOUNTER — Other Ambulatory Visit: Payer: Medicare Other

## 2017-02-28 ENCOUNTER — Other Ambulatory Visit (INDEPENDENT_AMBULATORY_CARE_PROVIDER_SITE_OTHER): Payer: Medicare Other | Admitting: *Deleted

## 2017-02-28 DIAGNOSIS — Z5181 Encounter for therapeutic drug level monitoring: Secondary | ICD-10-CM

## 2017-02-28 LAB — POCT INR: INR: 2.4

## 2017-03-01 ENCOUNTER — Ambulatory Visit (INDEPENDENT_AMBULATORY_CARE_PROVIDER_SITE_OTHER): Payer: Medicare Other | Admitting: Neurology

## 2017-03-01 ENCOUNTER — Encounter: Payer: Self-pay | Admitting: Neurology

## 2017-03-01 VITALS — BP 128/83 | HR 67

## 2017-03-01 DIAGNOSIS — R42 Dizziness and giddiness: Secondary | ICD-10-CM | POA: Diagnosis not present

## 2017-03-01 DIAGNOSIS — R29818 Other symptoms and signs involving the nervous system: Secondary | ICD-10-CM | POA: Diagnosis not present

## 2017-03-01 DIAGNOSIS — R2689 Other abnormalities of gait and mobility: Secondary | ICD-10-CM | POA: Diagnosis not present

## 2017-03-01 NOTE — Progress Notes (Signed)
[facility]    Provider:  Dr Jaynee Eagles Referring Provider: Briscoe Deutscher, DO Primary Care Physician:  Briscoe Deutscher, DO  CC:  Vertigo  HPI:  Marcus Galloway is a 76 y.o. male here as a referral from Dr. [name] for vertigo. Past medical history of atrial fibrillation, chronic neck pain, hyperlipidemia, alcohol abuse, congestive heart failure. He is a former smoker. He continues alcohol use. He is retired. He moved back here 3 months ago. He has had these "spells" for years and they are getting more frequent. Would have them every few weeks, brief 1-2 minutes. In the last few months he is having the episodes 5-6 times a day. If he stands up, his equilibrium is not equal and if he bends over he gets dizzy, lightheaded, the room spins. He feels his head spins. Episodes are brief and positional. No CP with episodes but he does have persistent afib. He has been taking oxycodone for 5-6 years and in the last week he increased to twice a day but this change does not coincide with worsening symptom onset, no other medication changes or known inciting events. Not associated with time of day, happens in the morning or at night. No sensory changes in the feet. Had a stroke during heart surgery 4 years ago. He was orphaned so no FHx. No other focal neurologic deficits, associated symptoms, inciting events or modifiable factors.   Reviewed notes, labs and imaging from outside physicians, which showed:   Reviewed primary care notes. Patient complains of dizziness for several months. Describes them as disequilibrium. Exacerbated by looking down, rising from squatting or sitting position and bending. He denies aural pressure, otalgia, otorrhea, tinnitus and hearing loss.  Reviewed labs: CBC, CMP and thyroid all normal. INR yesterday 2.4.  Reviewed EKG tracing March 2018 which showed persistent atrial fibrillation.  Review of Systems: Patient complains of symptoms per HPI as well as the  following symptoms: dizziness, memory loss, confusion, jpoint problem, back pain, aching muscles, spinning sensation. Pertinent negatives per HPI. All others negative.   Social History   Social History  . Marital status: Widowed    Spouse name: N/A  . Number of children: 4  . Years of education: 12   Occupational History  . Retired    Social History Main Topics  . Smoking status: Former Research scientist (life sciences)  . Smokeless tobacco: Never Used  . Alcohol use Yes     Comment: 2-3 drinks daily at night  . Drug use: No  . Sexual activity: Not Currently   Other Topics Concern  . Not on file   Social History Narrative   Lives alone, recently moved back to [place] from [employer] to be closer to his children   Right-handed   Caffeine: 3 cups each morning    Family History  Problem Relation Age of Onset  . Adopted: Yes  . Family history unknown: Yes    Past Medical History:  Diagnosis Date  . Aortic valve disease   . Atrial fibrillation ([place])   . BPH (benign prostatic hyperplasia)   . Chronic neck pain   . GERD (gastroesophageal reflux disease)   . History of colonic polyps   . Hyperlipidemia   . Hypertension   . Osteoarthritis   . Persistent atrial fibrillation ([place])   . Polio   . Prostate cancer [facility])     Past Surgical History:  Procedure Laterality Date  . 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  . CARDIOVERSION  09/2016  .  HERNIA REPAIR  1990  . INSERTION PROSTATE RADIATION SEED  2002    Current Outpatient Prescriptions  Medication Sig Dispense Refill  . atorvastatin (LIPITOR) 10 MG tablet Take 10 mg by mouth daily.    Marland Kitchen diltiazem (CARDIZEM CD) 180 MG 24 hr capsule Take 1 capsule (180 mg total) by mouth daily. 30 capsule 6  . doxycycline (ADOXA) 50 MG tablet Take 50 mg by mouth daily.    . metoprolol (LOPRESSOR) 50 MG tablet Take 75 mg by mouth 2 (two) times daily.    Marland Kitchen OMEPRAZOLE PO Take 20 mg by mouth daily.    Marland Kitchen oxyCODONE-acetaminophen (ROXICET) 5-325 MG  tablet Take 1 tablet by mouth 2 (two) times daily as needed for severe pain. 60 tablet 0  . tamsulosin (FLOMAX) 0.4 MG CAPS capsule Take 0.4 mg by mouth daily.    Marland Kitchen warfarin (COUMADIN) 5 MG tablet Take 5 mg by mouth daily.     No current facility-administered medications for this visit.     Allergies as of 03/01/2017  . (No Known Allergies)    Vitals: BP 128/83 (Patient Position: Standing)   Pulse 67  Last Weight:  Wt Readings from Last 1 Encounters:  02/14/17 187 lb (84.8 kg)   Last Height:   Ht Readings from Last 1 Encounters:  02/14/17 5\' 9"  (1.753 m)   Physical exam: Exam: Gen: NAD, conversant, well nourised, well groomed                     CV: Irregular, no MRG. No Carotid Bruits. +peripheral edema, warm, nontender Eyes: Conjunctivae clear without exudates or hemorrhage  Neuro: Detailed Neurologic Exam  Speech:    Speech is normal; fluent and spontaneous with normal comprehension.  Cognition:    The patient is oriented to person, place, and time;     recent and remote memory intact;     language fluent;     normal attention, concentration,     fund of knowledge Cranial Nerves:    The pupils are equal, round, and reactive to light. Attempted funduscopic exam could not visualize due to small pupils. Visual fields are full to finger confrontation. Extraocular movements are intact. Trigeminal sensation is intact and the muscles of mastication are normal. The face is symmetric. The palate elevates in the midline. Hearing intact. Voice is normal. Shoulder shrug is normal. The tongue has normal motion without fasciculations.   Coordination:    Normal finger to nose and heel to shin.  Gait:    Difficulty with tandem, mild imbalance with gait  Motor Observation:    No asymmetry, no atrophy. Mild postural tremor Tone:    Normal muscle tone.    Posture:    Posture is normal.    Strength:    Strength is V/V in the upper and lower limbs.      Sensation: intact to  LT, positive Romberg     Reflex Exam:  DTR's:    Deep tendon reflexes in the upper and lower extremities are normal bilaterally.   Toes:    Withdrawal bilaterally.   Clonus:    Clonus is absent.      Assessment/Plan:  76 y.o. male here as a referral from Dr. [name] for vertigo. Past medical history of atrial fibrillation, chronic neck pain, hyperlipidemia, alcohol abuse, congestive heart failure. He is a former smoker. He continues alcohol use. Here for evaluation of progressively worsening episodes daily of imbalance, vertigo, lightheadedness and "swimmy head".  - Given patient's  persistent dizziness and vertigo need to rule out intracranial etiologies such as stroke, schwannomas, cerebellar atrophy due to chronic alcohol abuse. - Patient is also on oxycodone and metoprolol and Cardizem, these medications can cause dizziness but there have been no dose adjustments or changes coinciding with the increased dizziness.  - Not orthostatic in the office today - recommend that pcp considers a heart monitor to assess for cardiac etiology during the eisodes as clinicaly warranted; may be having increased episodes of afib with rvr (also in the setting of pedal edema) - Needs vestibular therapy. Also imbalance and +Romberg need eval for gait and safety and balance training. Fall Risk. Discussed fall precautions. - For any worsening symptoms please call 911 or procedure to the emergency room.  Orders Placed This Encounter  Procedures  . MR BRAIN W WO CONTRAST  . Ambulatory referral to Physical Therapy   Cc: Briscoe Deutscher, DO  Sarina Ill, MD  [facility] [address] [place], [place] [address]  Phone [phone] Fax [phone]

## 2017-03-01 NOTE — Patient Instructions (Addendum)
Remember to drink plenty of fluid, eat healthy meals and do not skip any meals. Try to eat protein with a every meal and eat a healthy snack such as fruit or nuts in between meals. Try to keep a regular sleep-wake schedule and try to exercise daily, particularly in the form of walking, 20-30 minutes a day, if you can.   As far as diagnostic testing: MRI brain and vestibular physical therapy  Our phone number is [phone]. We also have an after hours call service for urgent matters and there is a physician on-call for urgent questions. For any emergencies you know to call 911 or go to the nearest emergency room   Dizziness Dizziness is a common problem. It is a feeling of unsteadiness or light-headedness. You may feel like you are about to faint. Dizziness can lead to injury if you stumble or fall. Anyone can become dizzy, but dizziness is more common in older adults. This condition can be caused by a number of things, including medicines, dehydration, or illness. Follow these instructions at home: Taking these steps may help with your condition: Eating and drinking   Drink enough fluid to keep your urine clear or pale yellow. This helps to keep you from becoming dehydrated. Try to drink more clear fluids, such as water.  Do not drink alcohol.  Limit your caffeine intake if directed by your health care provider.  Limit your salt intake if directed by your health care provider. Activity   Avoid making quick movements.  Rise slowly from chairs and steady yourself until you feel okay.  In the morning, first sit up on the side of the bed. When you feel okay, stand slowly while you hold onto something until you know that your balance is fine.  Move your legs often if you need to stand in one place for a long time. Tighten and relax your muscles in your legs while you are standing.  Do not drive or operate heavy machinery if you feel dizzy.  Avoid bending down if you feel dizzy. Place  items in your home so that they are easy for you to reach without leaning over. Lifestyle   Do not use any tobacco products, including cigarettes, chewing tobacco, or electronic cigarettes. If you need help quitting, ask your health care provider.  Try to reduce your stress level, such as with yoga or meditation. Talk with your health care provider if you need help. General instructions   Watch your dizziness for any changes.  Take medicines only as directed by your health care provider. Talk with your health care provider if you think that your dizziness is caused by a medicine that you are taking.  Tell a friend or a family member that you are feeling dizzy. If he or she notices any changes in your behavior, have this person call your health care provider.  Keep all follow-up visits as directed by your health care provider. This is important. Contact a health care provider if:  Your dizziness does not go away.  Your dizziness or light-headedness gets worse.  You feel nauseous.  You have reduced hearing.  You have new symptoms.  You are unsteady on your feet or you feel like the room is spinning. Get help right away if:  You vomit or have diarrhea and are unable to eat or drink anything.  You have problems talking, walking, swallowing, or using your arms, hands, or legs.  You feel generally weak.  You are not thinking clearly  or you have trouble forming sentences. It may take a friend or family member to notice this.  You have chest pain, abdominal pain, shortness of breath, or sweating.  Your vision changes.  You notice any bleeding.  You have a headache.  You have neck pain or a stiff neck.  You have a fever. This information is not intended to replace advice given to you by your health care provider. Make sure you discuss any questions you have with your health care provider. Document Released: 04/10/2001 Document Revised: 03/22/2016 Document Reviewed:  10/11/2014 Elsevier Interactive Patient Education  2017 [employer].

## 2017-03-04 NOTE — Telephone Encounter (Signed)
Returned the phone call to the patient. Advised him of Dr. Evette Georges advice: to avoid sodium and to consider compression socks. Patient verbalized his understanding.

## 2017-03-08 ENCOUNTER — Ambulatory Visit: Payer: Medicare Other | Attending: Neurology | Admitting: Rehabilitative and Restorative Service Providers"

## 2017-03-08 DIAGNOSIS — R2681 Unsteadiness on feet: Secondary | ICD-10-CM | POA: Insufficient documentation

## 2017-03-08 DIAGNOSIS — M542 Cervicalgia: Secondary | ICD-10-CM | POA: Diagnosis present

## 2017-03-08 DIAGNOSIS — M5440 Lumbago with sciatica, unspecified side: Secondary | ICD-10-CM | POA: Diagnosis present

## 2017-03-08 DIAGNOSIS — R42 Dizziness and giddiness: Secondary | ICD-10-CM

## 2017-03-08 DIAGNOSIS — R2689 Other abnormalities of gait and mobility: Secondary | ICD-10-CM | POA: Insufficient documentation

## 2017-03-08 DIAGNOSIS — M6281 Muscle weakness (generalized): Secondary | ICD-10-CM | POA: Diagnosis present

## 2017-03-08 NOTE — Progress Notes (Signed)
I have reviewed this visit and I agree on the patient's plan of dosage and recommendations. Mega Kinkade, DO   
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
## 2017-03-12 ENCOUNTER — Encounter: Payer: Self-pay | Admitting: *Deleted

## 2017-03-14 ENCOUNTER — Ambulatory Visit: Payer: Medicare Other | Admitting: Physical Therapy

## 2017-03-14 ENCOUNTER — Encounter: Payer: Self-pay | Admitting: Physical Therapy

## 2017-03-14 DIAGNOSIS — R2689 Other abnormalities of gait and mobility: Secondary | ICD-10-CM | POA: Diagnosis not present

## 2017-03-14 DIAGNOSIS — M5440 Lumbago with sciatica, unspecified side: Secondary | ICD-10-CM

## 2017-03-14 DIAGNOSIS — M542 Cervicalgia: Secondary | ICD-10-CM
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
## 2017-03-14 NOTE — Patient Instructions (Signed)
Bridging    Slowly raise buttocks from floor about 1 inch, keeping stomach tight. Count out loud to 5 (don't hold your breath). Repeat __5-10__ times per set. Do __1__ sets per session. Do __1__ sessions per day.  http://orth.exer.us/1096   Copyright  VHI. All rights reserved.  Supine: Leg Stretch With Strap (Intermediate)    Lie on back with one knee bent, foot flat on floor. Hook strap around other foot. Straighten knee, kicking foot towards ceiling. Hold __30_ seconds. Relax leg completely down to floor.  Repeat __3_ times per session. Repeat with other leg.  Do _1__ sessions per day.  Copyright  VHI. All rights reserved.  AROM: Lateral Neck Flexion    Lying down with head on one pillow and knees bent. Slowly tilt head toward one shoulder (do not lift head off the pillow), then the other. Go to the point of a mild stretch (up to 7/10 at most). Hold each position __30__ seconds. Repeat _3___ times per set. Do __1__ sets per session. Do _2___ sessions per day.  http://orth.exer.us/296   Copyright  VHI. All rights reserved.  Active Neck Rotation    Lying down with head on one pillow and knees bent. With head in a comfortable position and chin gently tucked in, rotate head to the right to the point of a mild stretch. Hold __30__ seconds. Repeat to the left. Repeat __3__ times. Do __2__ sessions per day.  http://gt2.exer.us/10   Copyright  VHI. All rights reserved.

## 2017-03-15 ENCOUNTER — Ambulatory Visit (INDEPENDENT_AMBULATORY_CARE_PROVIDER_SITE_OTHER): Payer: Self-pay | Admitting: *Deleted

## 2017-03-15 DIAGNOSIS — Z5181 Encounter for therapeutic drug level monitoring: Secondary | ICD-10-CM

## 2017-03-15 LAB — POCT INR: INR: 4.3

## 2017-03-15 NOTE — Progress Notes (Signed)
Patient states cannot confirm nor deny if taken extra dose. Denies bleeding episodes. Educated on bleeding precautions. Will see back in 1 week.   Will request to see patient 1/week x3 weeks

## 2017-03-15 NOTE — Progress Notes (Signed)
I have reviewed this visit and I agree on the patient's plan of dosage and recommendations.    Kevis Qu, PA    

## 2017-03-15 NOTE — Patient Instructions (Signed)
Please be mindful of bleeding precautions. Watch your steps when walking, light your hallways at home, caution with rugs at home, and be aware of bruising or abnormal bleeding from eyes, nose, ears, and voiding/moving bowels. If fall impacts head, please go to an urgent care or emergency department.

## 2017-03-19 ENCOUNTER — Encounter: Payer: Self-pay | Admitting: Cardiovascular Disease

## 2017-03-19 ENCOUNTER — Telehealth: Payer: Self-pay | Admitting: Family Medicine

## 2017-03-19 ENCOUNTER — Ambulatory Visit (INDEPENDENT_AMBULATORY_CARE_PROVIDER_SITE_OTHER): Payer: Medicare Other | Admitting: Cardiovascular Disease

## 2017-03-19 VITALS — BP 123/75 | HR 74 | Ht 69.0 in | Wt 188.8 lb

## 2017-03-19 DIAGNOSIS — I4819 Other persistent atrial fibrillation: Secondary | ICD-10-CM

## 2017-03-19 DIAGNOSIS — I481 Persistent atrial fibrillation: Secondary | ICD-10-CM

## 2017-03-19 DIAGNOSIS — Z952 Presence of prosthetic heart valve: Secondary | ICD-10-CM

## 2017-03-19 DIAGNOSIS — Z7901 Long term (current) use of anticoagulants: Secondary | ICD-10-CM

## 2017-03-19 DIAGNOSIS — E785 Hyperlipidemia, unspecified: Secondary | ICD-10-CM

## 2017-03-19 NOTE — Telephone Encounter (Signed)
Please advise on refill.

## 2017-03-19 NOTE — Patient Instructions (Signed)
Your physician recommends that you schedule a follow-up appointment in: 4 months with Dr Claiborne Billings.

## 2017-03-19 NOTE — Telephone Encounter (Signed)
**  Remind patient they can make refill requests via MyChart**  Medication refill request (Name & Dosage): oxyCODONE-acetaminophen (ROXICET) 5-325 MG tablet   Preferred pharmacy (Name & Address): [place], [place] - [address] [phone] (Phone) [phone] (Fax)      Other comments (if applicable):  Patient stated he has a few days left on refill and please call and advise when done

## 2017-03-19 NOTE — Progress Notes (Signed)
Cardiology Office Note    Date:  03/21/2017   ID:  Marcus Galloway, DOB [birth year], MRN [id number]  PCP:  Marcus Deutscher, DO  Cardiologist:  Marcus Majestic, MD   Chief Complaint  Patient presents with  . Follow-up  . Edema    eet and ankles    History of Present Illness:  Marcus Galloway is a 76 y.o. male Caucasian male who moved to [place] from [place].Marland Kitchen  He established cardiology care with meIn March 2018.  He presents for two-month follow-up evaluation.  Mr. 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  He was found to be in atrial fibrillation on routine office visit in November 2017.  Rate control and anticoagulation were initiated at that time and ultimately underwent cardioversion.  He develop recurrent atrial fibrillation and underwent sotalol loading with repeat cardioversion.  An echo Doppler study had shown normal LV size and function with an EF of 50-55%, biatrial enlargement, mild-to-moderate MR, moderate TR, and a normal prosthetic aortic valve.  When last seen by his cardiologist, Dr. Kellie Galloway in  [facility] [place] on 12/10/2016 , he was back in atrial fibrillation.  At that time, eliquis was discontinued as was sotalol and he was started on metoprolol tartrate 75 mg twice a day and Coumadin for anticoagulation.  He has subsequently moved to [place] and establish care with me.    When I initially saw him, he denied He denies any episodes of chest pain.  He reportedly had undergone cardiac catheterization prior to his valve replacement and his coronary arteries were clean.  Recently, he had noticed exertional dyspnea.  He is unaware of rapid heartbeats on his current dose of metoprolol.  He admits to adequate sleep.  He is unaware of awakening gasping for breath, and he believes his sleep is restorative.  When I saw  him.  His ECG revealed atrial fibrillation with ventricular rate in the 80s.  Suggested discontinuance of amlodipine and change to diltiazem CD 180 mg for improved rate control as well.  Blood pressure control.  I recommended a follow-up echo Doppler study, and this was done on 02/01/2017.  This revealed an ejection fraction of 60-65%.  He had normal wall motion without regional wall motion abnormalities.  His bioprosthetic aortic valve was well-seated, without obstruction and only with trivial regurgitation. He had severe biatrial enlargement.  There was mild pulmonary hypertension at 36 mm with mild TR.  With the above medication change.  She feels significantly better.  He denies chest pain.  He is having INR follow by Marcus Galloway.  INR was 2.4 most recently but had been elevated.  He presents for evaluation   Past Medical History:  Diagnosis Date  . Aortic valve disease   . Atrial fibrillation ([place])   . BPH (benign prostatic hyperplasia)   . Chronic neck pain   . GERD (gastroesophageal reflux disease)   . History of colonic polyps   . Hyperlipidemia   . Hypertension   . Osteoarthritis   . Persistent atrial fibrillation ([place])   . Polio   . Prostate cancer [facility])     Past Surgical History:  Procedure Laterality Date  . 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  . CARDIOVERSION  09/2016  . HERNIA REPAIR  1990  . INSERTION PROSTATE RADIATION SEED  2002    Current Medications: Outpatient  Medications Prior to Visit  Medication Sig Dispense Refill  . atorvastatin (LIPITOR) 10 MG tablet Take 10 mg by mouth daily.    Marland Kitchen diltiazem (CARDIZEM CD) 180 MG 24 hr capsule Take 1 capsule (180 mg total) by mouth daily. 30 capsule 6  . doxycycline (ADOXA) 50 MG tablet Take 50 mg by mouth daily.    . metoprolol (LOPRESSOR) 50 MG tablet Take 75 mg by mouth 2 (two) times daily.    Marland Kitchen OMEPRAZOLE PO Take 20 mg by mouth daily.    . tamsulosin (FLOMAX) 0.4 MG CAPS capsule Take 0.4 mg by  mouth daily.    Marland Kitchen warfarin (COUMADIN) 5 MG tablet Take 5 mg by mouth daily.    Marland Kitchen oxyCODONE-acetaminophen (ROXICET) 5-325 MG tablet Take 1 tablet by mouth 2 (two) times daily as needed for severe pain. 60 tablet 0   No facility-administered medications prior to visit.      Allergies:   Patient has no known allergies.   Social History   Social History  . Marital status: Widowed    Spouse name: N/A  . Number of children: 4  . Years of education: 12   Occupational History  . Retired    Social History Main Topics  . Smoking status: Former Research scientist (life sciences)  . Smokeless tobacco: Never Used  . Alcohol use Yes     Comment: 2-3 drinks daily at night  . Drug use: No  . Sexual activity: Not Currently   Other Topics Concern  . None   Social History Narrative   Lives alone, recently moved back to [place] from [facility] to be closer to his children   Right-handed   Caffeine: 3 cups each morning    Social history is notable in that he is widowed.  He is moved back here to be close to his children.  He had smoked remotely from age 19 until age 32 but quit in 71.  He has 4 children.  One daughter had undergone aortic valve replacement.  He has a half-sister but her whereabouts is unknown.  He drinks occasional alcohol with wine or scotch.   Family History:  He was adopted. Family history is unknown by patient.  However, his biological parents are deceased and he was able to find through records that his mother died at age 58 and his father died at age 1.  ROS General: Negative; No fevers, chills, or night sweats;  HEENT: Negative; No changes in vision or hearing, sinus congestion, difficulty swallowing Pulmonary: Negative; No cough, wheezing, shortness of breath, hemoptysis Cardiovascular: see HPI GI: History of GERD on omeprazole GU: He has a history of prostate CA and underwent radiation seeds in 2001. Musculoskeletal: Negative; no myalgias, joint pain , or weakness/  he had polio at age 25-6 and was in  the hospital for 5 months. Hematologic/Oncology: Negative; no easy bruising, bleeding Endocrine: Negative; no heat/cold intolerance; no diabetes Neuro: Negative; no changes in balance, headaches Skin: Negative; No rashes or skin lesions Psychiatric: Negative; No behavioral problems, depression Sleep: Negative; No snoring, daytime sleepiness, hypersomnolence, bruxism, restless legs, hypnogognic hallucinations, no cataplexy Other comprehensive 14 point system review is negative.   PHYSICAL EXAM:   VS:  BP 123/75   Pulse 74   Ht 5' 9" (1.753 m)   Wt 188 lb 12.8 oz (85.6 kg)   BMI 27.88 kg/m    Wt Readings from Last 3 Encounters:  03/19/17 188 lb 12.8 oz (85.6 kg)  02/14/17 187 lb (84.8 kg)  01/16/17  187 lb 3.2 oz (84.9 kg)   General: Alert, oriented, no distress.  Skin: normal turgor, no rashes, warm and dry HEENT: Normocephalic, atraumatic. Pupils equal round and reactive to light; sclera anicteric; extraocular muscles intact; Fundi disks flat.  No hemorrhages or exudates Nose without nasal septal hypertrophy Mouth/Parynx benign; Mallinpatti scale 3 Neck: No JVD, no carotid bruits; normal carotid upstroke Lungs: clear to ausculatation and percussion; no wheezing or rales Chest wall: without tenderness to palpitation Heart: PMI not displaced, irregularly irregular rhythm with a controlled ventricular rate, s1 s2 normal, 1/6 systolic murmur, no diastolic murmur, no rubs, gallops, thrills, or heaves Abdomen: soft, nontender; no hepatosplenomehaly, BS+; abdominal aorta nontender and not dilated by palpation. Back: no CVA tenderness Pulses 2+ Musculoskeletal: full range of motion, normal strength, no joint deformities Extremities: no clubbing cyanosis or edema, Homan's sign negative  Neurologic: grossly nonfocal; Cranial nerves grossly wnl Psychologic: Normal mood and affect   Studies/Labs Reviewed:   EKG:  EKG is ordered today. ECG (independently read by me): Atrial fibrillation  at 74 bpm.  Left anterior hemiblock.  QRS complex V1 and V2.  Nonspecific ST changes.  QTc interval 461 ms.  01/22/2017 ECG (independently read by me): Atrial fibrillation at a rate of 84 bpm.  Left anterior hemiblock.  Poor progression V1 through V3.  No significant ST segment changes.  Recent Labs: BMP Latest Ref Rng & Units 02/26/2017  Glucose 65 - 99 mg/dL 101(H)  BUN 7 - 25 mg/dL 8  Creatinine 0.70 - 1.18 mg/dL 0.78  Sodium 135 - 146 mmol/L 142  Potassium 3.5 - 5.3 mmol/L 4.4  Chloride 98 - 110 mmol/L 104  CO2 20 - 31 mmol/L 26  Calcium 8.6 - 10.3 mg/dL 9.1     Hepatic Function Latest Ref Rng & Units 02/26/2017  Total Protein 6.1 - 8.1 g/dL 6.4  Albumin 3.6 - 5.1 g/dL 4.1  AST 10 - 35 U/L 25  ALT 9 - 46 U/L 21  Alk Phosphatase 40 - 115 U/L 90  Total Bilirubin 0.2 - 1.2 mg/dL 0.8    CBC Latest Ref Rng & Units 02/26/2017  WBC 3.8 - 10.8 K/uL 7.2  Hemoglobin 13.2 - 17.1 g/dL 14.5  Hematocrit 38.5 - 50.0 % 43.7  Platelets 140 - 400 K/uL 211   Lab Results  Component Value Date   MCV 94.0 02/26/2017   Lab Results  Component Value Date   TSH 1.89 02/26/2017   No results found for: HGBA1C   BNP No results found for: BNP  ProBNP No results found for: PROBNP   Lipid Panel     Component Value Date/Time   CHOL 151 02/26/2017 0808   TRIG 130 02/26/2017 0808   HDL 57 02/26/2017 0808   CHOLHDL 2.6 02/26/2017 0808   VLDL 26 02/26/2017 0808   LDLCALC 68 02/26/2017 0808     RADIOLOGY: No results found.   Additional studies/ records that were reviewed today include:  I reviewed the office records from Dr. Bunnie Philips at  [facility] in [place], [place].  The echo was reviewed    ASSESSMENT:    1. Persistent atrial fibrillation ([place])   2. S/P aortic valve replacement   3. Hyperlipidemia, unspecified hyperlipidemia type   4. Anticoagulation adequate      PLAN:  Mr. Marcus Galloway is a 76 year old gentleman who unaware of his family  history as he was abandoned by his biologic parents and sent to a foster home.  He underwent  aortic valve replacement surgery which I presume was aortic stenosis and had a 25 mmEdwards pericardial valve inserted.  At the time of his surgery, he had normal coronary arteries.I had seen him for initial evaluation 2 months ago and prior to that evaluation.  He states that he had 4 attempts at cardioversion with recurrent atrial fibrillation.  As result, he now has permanent atrial fibrillation and is on warfarin anticoagulation.  His 2-D echo Doppler study from 02/01/2017 shows mild LVH with normal systolic function.  His bioprosthetic aortic valve is well-seated without stenosis and with only trivial AR.  He has severe biatrial enlargement undoubtedly contributing to his recurrent atrial fibrillation.  There is mild palmar hypertension.  He now feels significant better with the change to diltiazem from amlodipine.  His blood pressure today is well controlled on metoprolol 75 mg twice a day and diltiazem 180 mg daily.  He is getting frequent INR checks on warfarin and recently had been noted to have elevation.  He is on atorvastatin 10 mg for hyperlipidemia.  His GERD is controlled with omeprazole.  As long as he is stable, I will see him in 4 months for reevaluation.   Medication Adjustments/Labs and Tests Ordered: Current medicines are reviewed at length with the patient today.  Concerns regarding medicines are outlined above.  Medication changes, Labs and Tests ordered today are listed in the Patient Instructions below.  Patient Instructions  Your physician recommends that you schedule a follow-up appointment in: 4 months with Dr Claiborne Billings.    Signed, Marcus Majestic, MD, [facility]  03/21/2017 8:19 PM    [facility] [address] [place], [place], [place]  [address] Phone: [phone]

## 2017-03-20 ENCOUNTER — Other Ambulatory Visit: Payer: Self-pay

## 2017-03-20 DIAGNOSIS — G8929 Other chronic pain: Secondary | ICD-10-CM

## 2017-03-20 DIAGNOSIS — M545 Low back pain: Principal | ICD-10-CM

## 2017-03-20 MED ORDER — OXYCODONE-ACETAMINOPHEN 5-325 MG PO TABS: 1.0000 | ORAL_TABLET | Freq: Two times a day (BID) | ORAL | 0 refills | Status: DC | PRN

## 2017-03-20 MED ORDER — OXYCODONE-ACETAMINOPHEN 5-325 MG PO TABS
1.0000 | ORAL_TABLET | Freq: Two times a day (BID) | ORAL | 0 refills | Status: DC | PRN
Start: 2017-03-20 — End: 2017-03-20

## 2017-03-20 NOTE — Telephone Encounter (Signed)
Rx (2) written and signed.  Placed at reception for pick up.  Patient verbalized understanding and knows he must make an appointment to receive refills in the future.

## 2017-03-20 NOTE — Telephone Encounter (Signed)
Okay x 2 months Rx. Must have appointment after.

## 2017-03-21 ENCOUNTER — Ambulatory Visit: Payer: Medicare Other | Admitting: Physical Therapy

## 2017-03-22 ENCOUNTER — Other Ambulatory Visit (INDEPENDENT_AMBULATORY_CARE_PROVIDER_SITE_OTHER): Payer: Medicare Other

## 2017-03-22 ENCOUNTER — Ambulatory Visit: Payer: Medicare Other | Admitting: Physical Therapy

## 2017-03-22 ENCOUNTER — Ambulatory Visit: Payer: Medicare Other

## 2017-03-22 ENCOUNTER — Encounter: Payer: Self-pay | Admitting: Physical Therapy

## 2017-03-22 ENCOUNTER — Ambulatory Visit: Payer: Self-pay | Admitting: Family Medicine

## 2017-03-22 DIAGNOSIS — Z5181 Encounter for therapeutic drug level monitoring: Secondary | ICD-10-CM | POA: Diagnosis not present

## 2017-03-22 DIAGNOSIS — M6281 Muscle weakness (generalized): Secondary | ICD-10-CM

## 2017-03-22 DIAGNOSIS — Z7901 Long term (current) use of anticoagulants: Secondary | ICD-10-CM | POA: Diagnosis not present

## 2017-03-22 DIAGNOSIS — R2689 Other abnormalities of gait and mobility: Secondary | ICD-10-CM | POA: Diagnosis not present

## 2017-03-22 DIAGNOSIS — R42 Dizziness and giddiness: Secondary | ICD-10-CM

## 2017-03-22 DIAGNOSIS — M5440 Lumbago with sciatica, unspecified side: Secondary | ICD-10-CM

## 2017-03-22 DIAGNOSIS — M542 Cervicalgia: Secondary | ICD-10-CM

## 2017-03-22 LAB — POCT INR: INR: 2.3
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
## 2017-03-22 NOTE — Patient Instructions (Addendum)
Bridging    Slowly raise buttocks from floor about 1 inch, keeping stomach tight. Count out loud to 5 (don't hold your breath). Repeat __5-10__ times per set. Do __1__ sets per session. Do __1__ sessions per day.  http://orth.exer.us/1096   Copyright  VHI. All rights reserved.  Supine: Leg Stretch With Strap (Intermediate)    Lie on back with one knee bent, foot flat on floor. Hook strap around other foot. Straighten knee, kicking foot towards ceiling. Hold __30_ seconds. Relax leg completely down to floor.  Repeat __3_ times per session. Repeat with other leg.  Do _1__ sessions per day.  Copyright  VHI. All rights reserved.  AROM: Lateral Neck Flexion    Sitting up straight. Slowly tilt head toward one shoulder (take your earlobe towards your shoulder), then the other. Go to the point of a mild stretch (up to 7 out of 10 on the pain scale at most). Hold each position __30__ seconds. Repeat _3___ times per set. Do __1__ sets per session. Do _2___ sessions per day.  http://orth.exer.us/296   Copyright  VHI. All rights reserved.  Active Neck Rotation    Lying down with head on one pillow and knees bent. With head in a comfortable position and chin gently tucked in, rotate head to the right to the point of a mild stretch. Hold __30__ seconds. Repeat to the left. Repeat __3__ times. Do __2__ sessions per day.  http://gt2.exer.us/10   Copyright  VHI. All rights reserved.       Supine With Rotation    Lie, back flat, legs bent, feet together. Rotate knees to one side. Hold _30__ seconds. Repeat to other side. Repeat __3_ times per session. Do _2__ sessions per day.  Copyright  VHI. All rights reserved.     Bent Leg Lift (Hook-Lying)    Tighten stomach and slowly raise right knee toward your chin. Keep trunk rigid. Hold __3__ seconds. Repeat __20__ times per set. Do __1__ sets per session. Do __2__ sessions per day.  http://orth.exer.us/1090   Copyright   VHI. All rights reserved.

## 2017-03-27 ENCOUNTER — Ambulatory Visit: Payer: Medicare Other | Admitting: Rehabilitative and Restorative Service Providers"

## 2017-03-27 DIAGNOSIS — M5440 Lumbago with sciatica, unspecified side: Secondary | ICD-10-CM

## 2017-03-27 DIAGNOSIS — M542 Cervicalgia: Secondary | ICD-10-CM

## 2017-03-27 DIAGNOSIS — R42 Dizziness and giddiness: Secondary | ICD-10-CM

## 2017-03-27 DIAGNOSIS — M6281 Muscle weakness (generalized): Secondary | ICD-10-CM

## 2017-03-27 DIAGNOSIS — R2689 Other abnormalities of gait and mobility: Secondary | ICD-10-CM | POA: Diagnosis not present

## 2017-03-28 ENCOUNTER — Telehealth: Payer: Self-pay | Admitting: Rehabilitative and Restorative Service Providers"
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
## 2017-03-28 NOTE — Telephone Encounter (Addendum)
-----   Message from Wallace Cullens sent at 03/27/2017  3:36 PM EDT ----- Pt cx'd all remaining appts-can't afford therapy at this time, and he has too much on his plate right now  PT called to check on patient.  Patient reports that he has too many appointments coming up and he is on a fixed budget.   He notes he is having a hard time making ends meet and not able to handle everything.   PT to do a discharge summary due to patient's request.  Rudell Cobb, PT

## 2017-03-29 ENCOUNTER — Ambulatory Visit: Payer: Medicare Other | Admitting: Physical Therapy

## 2017-03-29 ENCOUNTER — Telehealth: Payer: Self-pay | Admitting: Family Medicine

## 2017-03-29 ENCOUNTER — Ambulatory Visit (INDEPENDENT_AMBULATORY_CARE_PROVIDER_SITE_OTHER): Payer: Medicare Other | Admitting: *Deleted

## 2017-03-29 DIAGNOSIS — Z5181 Encounter for therapeutic drug level monitoring: Secondary | ICD-10-CM | POA: Diagnosis not present

## 2017-03-29 LAB — POCT INR: INR: 2.8

## 2017-03-29 MED ORDER — ATORVASTATIN CALCIUM 10 MG PO TABS: 10.0000 mg | ORAL_TABLET | Freq: Every day | ORAL | 4 refills | Status: DC

## 2017-03-29 NOTE — Progress Notes (Signed)
I have reviewed this visit and I agree on the patient's plan of dosage and recommendations. Violeta Lecount, DO   

## 2017-03-29 NOTE — Telephone Encounter (Signed)
RX sent to pharmacy  

## 2017-03-29 NOTE — Telephone Encounter (Signed)
**  Remind patient they can make refill requests via MyChart**  Medication refill request (Name & Dosage): atorvastatin (LIPITOR) 10 MG tablet [[id number]]     Preferred pharmacy (Name & Address): [place], [place] - [address] [phone] (Phone) [phone] (Fax)       Other comments (if applicable):

## 2017-04-01 ENCOUNTER — Ambulatory Visit: Payer: Medicare Other | Admitting: Rehabilitative and Restorative Service Providers"

## 2017-04-03 ENCOUNTER — Ambulatory Visit: Payer: Medicare Other | Admitting: Rehabilitative and Restorative Service Providers"

## 2017-04-05 ENCOUNTER — Encounter: Payer: Self-pay | Admitting: Rehabilitative and Restorative Service Providers"

## 2017-04-05 NOTE — Therapy (Signed)
[facility] [address] [place], [place], [address] Phone: [phone]   Fax:  [phone]  Patient Details  Name: Marcus Galloway MRN: [id number] Date of Birth: [birth year] Referring Provider:  No ref. provider found  Encounter Date: last encounter 03/27/17  PHYSICAL THERAPY DISCHARGE SUMMARY  Visits from Start of Care: 4  Current functional level related to goals / functional outcomes:  GOALS NOT FORMALLY REASSESSED - patient self d/c      PT Short Term Goals - 03/27/17 2059      PT SHORT TERM GOAL #1   Title The patient will be indep with HEP for neck AROM, core stabilization, LE strengthening, balance exercises.   Baseline Target date 04/08/17   Time 4   Period Weeks     PT SHORT TERM GOAL #2   Title The patient will improve Berg from 40/56 to > or equal to 45/56 to demo improving balance.   Baseline Target date 04/08/17   Time 4   Period Weeks     PT SHORT TERM GOAL #3   Title The patient will demonstrate improved tolerance to mobility by walking 5 minutes nonstop.   Baseline Met on 03/27/2017   Time 4   Period Weeks   Status Achieved     PT SHORT TERM GOAL #4   Title The patient will improve neck AROM from 25 degrees bilaterally to 40 degrees bilaterally.   Baseline Target date 04/08/17   Time 4   Period Weeks            PT Long Term Goals - 03/08/17 1335      PT LONG TERM GOAL #1   Title The patient will reduce DHI from 44% to < or equal to 28% to demo dec'ing self perception of vertigo.   Baseline Target date 05/07/17   Time 8   Period Weeks     PT LONG TERM GOAL #2   Title The patient will improve walking tolerance to > or equal to 10 minutes nonstop.   Baseline Target date 05/07/17   Time 8   Period Weeks     PT LONG TERM GOAL #3   Title The patient will improve Berg from 40/56 to > or equal to 48/56.   Baseline Target date 05/07/17   Time 8   Period Weeks     PT LONG TERM GOAL #4   Title The patient will return demo progression of HEP for core stability due to chronic pain in back.   Baseline Target date 05/07/17   Time 8   Period Weeks      Remaining deficits: Continued chronic pain, imbalance, gait deficits noted on initial evaluation.   Education / Equipment: HEP, community wellness, home walking.  Plan: Patient agrees to discharge.  Patient goals were not met. Patient is being discharged due to financial reasons.  ?????              Thank you for the referral of this patient. Rudell Cobb, MPT   Tyshika Baldridge 04/05/2017, 10:51 AM  [facility] [address] [place], [place], [address] Phone: [phone]   Fax:  [phone]

## 2017-04-08 ENCOUNTER — Ambulatory Visit: Payer: Medicare Other | Admitting: Rehabilitative and Restorative Service Providers"

## 2017-04-12 ENCOUNTER — Ambulatory Visit (INDEPENDENT_AMBULATORY_CARE_PROVIDER_SITE_OTHER): Payer: Medicare Other | Admitting: *Deleted

## 2017-04-12 DIAGNOSIS — Z5181 Encounter for therapeutic drug level monitoring: Secondary | ICD-10-CM | POA: Diagnosis not present

## 2017-04-12 LAB — POCT INR: INR: 2.9

## 2017-04-12 NOTE — Progress Notes (Signed)
I have reviewed this visit and I agree on the patient's plan of dosage and recommendations.    Vaughan Garfinkle, PA    

## 2017-05-03 ENCOUNTER — Ambulatory Visit (INDEPENDENT_AMBULATORY_CARE_PROVIDER_SITE_OTHER): Payer: Medicare Other | Admitting: *Deleted

## 2017-05-03 DIAGNOSIS — Z5181 Encounter for therapeutic drug level monitoring: Secondary | ICD-10-CM | POA: Diagnosis not present

## 2017-05-03 LAB — POCT INR: INR: 3.9

## 2017-05-03 NOTE — Patient Instructions (Addendum)
-  Review diet pamphlet and understanding greens high in vitamin k and low in vitamin k to determine how it impacts your Coumadin management.  -Be aware of discussed bleeding precautions. If you hit your head, go to nearest ED. Also if you are bleeding anywhere and have a hard time stopping the bleeding, go to ED.   -We will see you in two weeks. Call office if any questions arise.

## 2017-05-06 NOTE — Progress Notes (Signed)
I have reviewed this visit and I agree on the patient's plan of dosage and recommendations. Aariz Maish, DO   

## 2017-05-10 ENCOUNTER — Ambulatory Visit: Payer: Medicare Other

## 2017-05-16 ENCOUNTER — Ambulatory Visit (INDEPENDENT_AMBULATORY_CARE_PROVIDER_SITE_OTHER): Payer: Medicare Other | Admitting: *Deleted

## 2017-05-16 ENCOUNTER — Ambulatory Visit (INDEPENDENT_AMBULATORY_CARE_PROVIDER_SITE_OTHER): Payer: Medicare Other | Admitting: Family Medicine

## 2017-05-16 ENCOUNTER — Ambulatory Visit (INDEPENDENT_AMBULATORY_CARE_PROVIDER_SITE_OTHER): Payer: Medicare Other

## 2017-05-16 ENCOUNTER — Encounter: Payer: Self-pay | Admitting: Family Medicine

## 2017-05-16 VITALS — BP 130/74 | HR 58 | Temp 98.4°F | Ht 69.0 in | Wt 189.2 lb

## 2017-05-16 DIAGNOSIS — R42 Dizziness and giddiness: Secondary | ICD-10-CM | POA: Insufficient documentation

## 2017-05-16 DIAGNOSIS — J439 Emphysema, unspecified: Secondary | ICD-10-CM | POA: Diagnosis not present

## 2017-05-16 DIAGNOSIS — R0602 Shortness of breath: Secondary | ICD-10-CM

## 2017-05-16 DIAGNOSIS — M545 Low back pain, unspecified: Secondary | ICD-10-CM

## 2017-05-16 DIAGNOSIS — G8929 Other chronic pain: Secondary | ICD-10-CM | POA: Diagnosis not present

## 2017-05-16 DIAGNOSIS — M47816 Spondylosis without myelopathy or radiculopathy, lumbar region: Secondary | ICD-10-CM | POA: Insufficient documentation

## 2017-05-16 DIAGNOSIS — R2689 Other abnormalities of gait and mobility: Secondary | ICD-10-CM

## 2017-05-16 DIAGNOSIS — Z5181 Encounter for therapeutic drug level monitoring: Secondary | ICD-10-CM | POA: Diagnosis not present

## 2017-05-16 LAB — POCT INR: INR: 2.2

## 2017-05-16 MED ORDER — OXYCODONE-ACETAMINOPHEN 5-325 MG PO TABS: 1.0000 | ORAL_TABLET | Freq: Two times a day (BID) | ORAL | 0 refills | Status: DC | PRN

## 2017-05-16 NOTE — Progress Notes (Signed)
Marcus Galloway is a 76 y.o. male is here for follow up.  History of Present Illness:   Water quality scientist, CMA, acting as scribe for Dr. [name].  HPI:    Dizziness Patient comes in today for follow up.  States he saw neurology for his dizziness and was diagnosed with vertigo.  He was sent to vestibular rehab.  He went for a few weeks and then stopped going because it wasn't helping him.  He also felt that the people at vestibular rehab were being "pushy" with him.  States that sometimes when standing he will be off balance and has to get his bearings before he moves.  An MRI of the brain has been ordered, but patient has not had it done yet.  Back Pain He continues to have low back pain.  This is chronic.  Takes oxycodone for the pain.  He was taking this once a day and states he was having a lot of pain in the morning when getting out of bed.  It was suggested to him to take this twice daily.  He began taking it twice daily and states it has made a world of difference.  He feels much better.  States he was only taking it once daily because he was afraid of getting addicted to it.    Shortness of Breath Reports some shortness of breath.  States he can only walk a short distance without feeling "winded".  He doesn't have any chest congestion or cough.  He has atrial fibrillation and CHF.  He states he had SOB in the past but it has worsened over the past 2-3 months.  He states he has swelling in his ankles and feet as well.  The swelling comes and goes.  States he has excessive phlegm when waking in the morning.  Denies chest pain.  He states he doesn't get enough exercise.  Patient repeats himself often during his office visit.  Health Maintenance Due  Topic Date Due  . PNA vac Low Risk Adult (1 of 2 - PCV13) 05/23/2005   Depression screen PHQ 2/9 01/14/2017  Decreased Interest 0  Down, Depressed, Hopeless 0  PHQ - 2 Score 0   PMHx, SurgHx, SocialHx, FamHx, Medications, and Allergies were  reviewed in the Visit Navigator and updated as appropriate.   Patient Active Problem List   Diagnosis Date Noted  . Vertigo 05/16/2017  . Back pain 05/16/2017  . Shortness of breath 05/16/2017  . BPH with obstruction/lower urinary tract symptoms 01/15/2017  . Arthritis 01/14/2017  . GERD (gastroesophageal reflux disease) 01/14/2017  . Prostate cancer ([place]) 01/14/2017  . Persistent atrial fibrillation ([place]) 01/14/2017  . Rosacea 01/14/2017  . Chronic neck pain 01/14/2017  . Hyperlipidemia   . Coagulopathy ([place]) 10/18/2012  . S/P aortic valve replacement 10/17/2012  . ETOH abuse 10/16/2012  . CHF NYHA class II ([place]) 10/14/2012   Social History  Substance Use Topics  . Smoking status: Former Research scientist (life sciences)  . Smokeless tobacco: Never Used  . Alcohol use Yes     Comment: 2-3 drinks daily at night   Current Medications and Allergies:   .  atorvastatin (LIPITOR) 10 MG tablet, Take 1 tablet (10 mg total) by mouth daily., Disp: 30 tablet, Rfl: 4 .  doxycycline (ADOXA) 50 MG tablet, Take 50 mg by mouth daily., Disp: , Rfl:  .  metoprolol (LOPRESSOR) 50 MG tablet, Take 75 mg by mouth 2 (two) times daily., Disp: , Rfl:  .  OMEPRAZOLE PO, Take  20 mg by mouth daily., Disp: , Rfl:  .  oxyCODONE-acetaminophen (ROXICET) 5-325 MG tablet, Take 1 tablet by mouth 2 (two) times daily as needed for severe pain., Disp: 60 tablet, Rfl: 0 .  tamsulosin (FLOMAX) 0.4 MG CAPS capsule, Take 0.4 mg by mouth daily., Disp: , Rfl:  .  warfarin (COUMADIN) 5 MG tablet, Take 5 mg by mouth daily., Disp: , Rfl:  .  diltiazem (CARDIZEM CD) 180 MG 24 hr capsule, Take 1 capsule (180 mg total) by mouth daily., Disp: 30 capsule, Rfl: 6  No Known Allergies   Review of Systems   Pertinent items are noted in the HPI. Otherwise, ROS is negative.  Vitals:   Vitals:   05/16/17 1115  BP: 130/74  Pulse: (!) 58  Temp: 98.4 F (36.9 C)  TempSrc: Oral  SpO2: 98%  Weight: 189 lb 3.2 oz (85.8 kg)  Height: 5\' 9"  (1.753 m)      Body mass index is 27.94 kg/m.   Physical Exam:   Physical Exam  Constitutional: He appears well-developed and well-nourished. No distress.  HENT:  Head: Normocephalic and atraumatic.  Right Ear: External ear normal.  Left Ear: External ear normal.  Nose: Nose normal.  Mouth/Throat: Oropharynx is clear and moist.  Eyes: Pupils are equal, round, and reactive to light. Conjunctivae and EOM are normal.  Neck: Normal range of motion. Neck supple.  Cardiovascular: Normal rate and intact distal pulses.  An irregularly irregular rhythm present.  Pulmonary/Chest: Effort normal and breath sounds normal.  Abdominal: Soft. Bowel sounds are normal.  Musculoskeletal: Normal range of motion.  Neurological: He is alert. He displays normal reflexes. No cranial nerve deficit or sensory deficit. He exhibits normal muscle tone. Gait abnormal. Coordination normal.  Skin: Skin is warm and dry.  Psychiatric: He has a normal mood and affect. His behavior is normal. Judgment and thought content normal.  Nursing note and vitals reviewed.   Results for orders placed or performed in visit on 05/16/17  POCT INR  Result Value Ref Range   INR 2.2     EXAM: CHEST  2 VIEW  COMPARISON:  None.  FINDINGS: Wall there changes from the previous aortic valve replacement. The cardiac silhouette is normal in size and configuration. No mediastinal or hilar masses. No evidence of adenopathy.  Mild linear opacities noted at the lung bases consistent with scarring or subsegmental atelectasis. Lungs are hyperexpanded but otherwise clear.  No pleural effusion or pneumothorax.  Skeletal structures are demineralized. There is slight wedge-shaped deformities at the thoracolumbar junction, most likely chronic.  IMPRESSION: 1. No acute cardiopulmonary disease. 2. Status post cardiac surgery and aortic valve replacement. 3. Hyperexpanded lungs suggests COPD.    Assessment and Plan:   Marcus Galloway was seen today  for follow-up.  Diagnoses and all orders for this visit:  Chronic midline low back pain without sciatica Comments: Well controlled.  No signs of complications, medication side effects, or red flags.  Continue current regimen.   Orders: -     oxyCODONE-acetaminophen (ROXICET) 5-325 MG tablet; Take 1 tablet by mouth 2 (two) times daily as needed for severe pain. -     oxyCODONE-acetaminophen (ROXICET) 5-325 MG tablet; Take 1 tablet by mouth 2 (two) times daily as needed for severe pain. -     oxyCODONE-acetaminophen (ROXICET) 5-325 MG tablet; Take 1 tablet by mouth 2 (two) times daily as needed for severe pain.  Imbalance Comments: No improvement with vestibular rehab. Will make sure that MRI is scheduled.  Shortness of breath Comments: Worsening. Hx of CHF. Last ECHO on 02/01/2017 with EF 60-65%, severe biatrial enlargement, functioning prosthesis, mild pulmonary HTN. Patient is consistently in atrial fibrillation, but always rate-controlled in the office. Above CXR with COPD but no acute findings otherwise. Unclear today if this is progressive cardiopumonary deconditioning or something more acute. Will refer back to Cardiology to weigh in. Orders: -     DG Chest 2 View above   . Reviewed expectations re: course of current medical issues. . Discussed self-management of symptoms. . Outlined signs and symptoms indicating need for more acute intervention. . Patient verbalized understanding and all questions were answered. Marland Kitchen Health Maintenance issues including appropriate healthy diet, exercise, and smoking avoidance were discussed with patient. . See orders for this visit as documented in the electronic medical record. . Patient received an After Visit Summary.  CMA served as Education administrator during this visit. History, Physical, and Plan performed by medical provider. The above documentation has been reviewed and is accurate and complete. Briscoe Deutscher, D.O.  Briscoe Deutscher, DO [place] [place] 05/16/2017  Future Appointments Date Time Provider [place]  06/07/2017 10:30 AM [facility] None  07/23/2017 2:20 PM Troy Sine, MD [facility]

## 2017-05-16 NOTE — Patient Instructions (Signed)
We will call you with appointment for MRI.

## 2017-05-16 NOTE — Progress Notes (Signed)
I have reviewed this visit and I agree on the patient's plan of dosage and recommendations. Kyiah Canepa, DO   

## 2017-05-18 DIAGNOSIS — J449 Chronic obstructive pulmonary disease, unspecified: Secondary | ICD-10-CM | POA: Insufficient documentation

## 2017-05-20 ENCOUNTER — Other Ambulatory Visit: Payer: Self-pay

## 2017-05-20 DIAGNOSIS — R2689 Other abnormalities of gait and mobility: Secondary | ICD-10-CM

## 2017-05-21 ENCOUNTER — Telehealth: Payer: Self-pay | Admitting: Family Medicine

## 2017-05-21 ENCOUNTER — Encounter: Payer: Self-pay | Admitting: Neurology

## 2017-05-21 NOTE — Telephone Encounter (Signed)
Patient's daughter [name] called to discuss the patient getting scheduled for his MRI and discuss his health. Patient forgets at times to schedule appointments and [name] just needs clarification on his MRI so that everything can be addressed. Please call [name] at [phone] to discuss. [name] is on the [facility].

## 2017-05-21 NOTE — Telephone Encounter (Signed)
Spoke with patient's daughter and advised that she would need to call neurology to get MRI scheduled. She verbalized understanding. I explained that maybe she should come with him to next appointment and she said that he would not let her go.

## 2017-05-22 ENCOUNTER — Other Ambulatory Visit: Payer: Self-pay | Admitting: Surgical

## 2017-05-22 MED ORDER — DOXYCYCLINE MONOHYDRATE 50 MG PO TABS: 50.0000 mg | ORAL_TABLET | Freq: Every day | ORAL | 3 refills | Status: DC

## 2017-05-22 MED ORDER — METOPROLOL TARTRATE 50 MG PO TABS: 75.0000 mg | ORAL_TABLET | Freq: Two times a day (BID) | ORAL | 3 refills | Status: DC

## 2017-05-22 MED ORDER — TAMSULOSIN HCL 0.4 MG PO CAPS: 0.4000 mg | ORAL_CAPSULE | Freq: Every day | ORAL | 3 refills | Status: DC

## 2017-05-22 NOTE — Progress Notes (Signed)
RX request sent over from [facility]. I have refilled the doxycycline Mono 50 mg, metoprolol tartrate, and Flomax. I have left message for patient on voicemail of this.

## 2017-06-07 ENCOUNTER — Ambulatory Visit (INDEPENDENT_AMBULATORY_CARE_PROVIDER_SITE_OTHER): Payer: Medicare Other | Admitting: *Deleted

## 2017-06-07 DIAGNOSIS — Z5181 Encounter for therapeutic drug level monitoring: Secondary | ICD-10-CM | POA: Diagnosis not present

## 2017-06-07 LAB — POCT INR: INR: 3.1

## 2017-06-08 NOTE — Progress Notes (Signed)
I have reviewed this visit and I agree on the patient's plan of dosage and recommendations. Georgean Spainhower, DO   

## 2017-06-28 ENCOUNTER — Ambulatory Visit (INDEPENDENT_AMBULATORY_CARE_PROVIDER_SITE_OTHER): Payer: Medicare Other | Admitting: *Deleted

## 2017-06-28 DIAGNOSIS — Z5181 Encounter for therapeutic drug level monitoring: Secondary | ICD-10-CM

## 2017-06-28 LAB — POCT INR: INR: 3.3

## 2017-06-28 NOTE — Progress Notes (Signed)
I have reviewed this visit and I agree on the patient's plan of dosage and recommendations. Tavien Chestnut, DO   

## 2017-07-03 ENCOUNTER — Telehealth: Payer: Self-pay | Admitting: Family Medicine

## 2017-07-03 ENCOUNTER — Other Ambulatory Visit: Payer: Self-pay

## 2017-07-03 MED ORDER — ATORVASTATIN CALCIUM 10 MG PO TABS: 10.0000 mg | ORAL_TABLET | Freq: Every day | ORAL | 4 refills | Status: DC

## 2017-07-03 NOTE — Telephone Encounter (Signed)
Did not send refill for doxycycline as this is an antibiotic.  Called patient.  This was requested in error.

## 2017-07-03 NOTE — Telephone Encounter (Signed)
Refill sent for Lipitor.

## 2017-07-03 NOTE — Telephone Encounter (Signed)
MEDICATION: atorvastatin (LIPITOR) 10 MG tablet, doxycycline (ADOXA) 50 MG tablet  PHARMACY: [facility], [place]    IS THIS A 90 DAY SUPPLY : atorvastatin (LIPITOR) 10 MG tablet  IS PATIENT OUT OF MEDICATION: yes  IF NOT; HOW MUCH IS LEFT: n/a  LAST APPOINTMENT DATE:06/28/17  NEXT APPOINTMENT DATE:07/12/17  OTHER COMMENTS:    **Let patient know to contact pharmacy at the end of the day to make sure medication is ready. **  ** Please notify patient to allow 48-72 hours to process**  **Encourage patient to contact the pharmacy for refills or they can request refills through [facility]

## 2017-07-05 NOTE — Progress Notes (Signed)
Subjective:   Marcus Galloway is a 76 y.o. male who presents for an Initial Medicare Annual Wellness Visit.  Review of Systems  No ROS.  Medicare Wellness Visit. Additional risk factors are reflected in the social history.  Cardiac Risk Factors include: advanced age (>27men, >54 women);male gender;dyslipidemia;hypertension    Objective:    Today's Vitals   07/12/17 1106  BP: 124/84  Pulse: 74  Resp: 16  SpO2: 97%  Weight: 190 lb 9.6 oz (86.5 kg)  Height: 5\' 9"  (1.753 m)   Body mass index is 28.15 kg/m.  Current Medications (verified) Outpatient Encounter Prescriptions as of 07/12/2017  Medication Sig  . atorvastatin (LIPITOR) 10 MG tablet Take 1 tablet (10 mg total) by mouth daily.  Marland Kitchen doxycycline (ADOXA) 50 MG tablet Take 1 tablet (50 mg total) by mouth daily.  . metoprolol tartrate (LOPRESSOR) 50 MG tablet Take 1.5 tablets (75 mg total) by mouth 2 (two) times daily.  Marland Kitchen OMEPRAZOLE PO Take 20 mg by mouth daily.  Marland Kitchen oxyCODONE-acetaminophen (ROXICET) 5-325 MG tablet Take 1 tablet by mouth 2 (two) times daily as needed for severe pain.  . tamsulosin (FLOMAX) 0.4 MG CAPS capsule Take 1 capsule (0.4 mg total) by mouth daily.  Marland Kitchen warfarin (COUMADIN) 5 MG tablet Take 5 mg by mouth daily.  Marland Kitchen diltiazem (CARDIZEM CD) 180 MG 24 hr capsule Take 1 capsule (180 mg total) by mouth daily.   No facility-administered encounter medications on file as of 07/12/2017.     Allergies (verified) Patient has no known allergies.   History: Past Medical History:  Diagnosis Date  . Aortic valve disease   . Atrial fibrillation ([name])   . BPH (benign prostatic hyperplasia)   . Chronic neck pain   . GERD (gastroesophageal reflux disease)   . History of colonic polyps   . Hyperlipidemia   . Hypertension   . Osteoarthritis   . Persistent atrial fibrillation ([place])   . Polio   . Prostate cancer [facility])    Past Surgical History:  Procedure Laterality Date  . 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 CARDIOVERSION  09/2016  . HERNIA REPAIR  1990  . INSERTION PROSTATE RADIATION SEED  2002   Family History  Problem Relation Age of Onset  . Adopted: Yes  . Family history unknown: Yes   Social History   Occupational History  . Retired    Social History Main Topics  . Smoking status: Former Research scientist (life sciences)  . Smokeless tobacco: Never Used  . Alcohol use Yes     Comment: 2-3 drinks daily at night  . Drug use: No  . Sexual activity: Not Currently   Tobacco Counseling Counseling given: Not Answered   Activities of Daily Living In your present state of health, do you have any difficulty performing the following activities: 07/12/2017  Hearing? N  Vision? N  Difficulty concentrating or making decisions? N  Walking or climbing stairs? N  Dressing or bathing? N  Doing errands, shopping? N  Preparing Food and eating ? N  Using the Toilet? N  In the past six months, have you accidently leaked urine? N  Do you have problems with loss of bowel control? N  Managing your Medications? N  Managing your Finances? N  Housekeeping or managing your Housekeeping? N    Immunizations and Health Maintenance  There is no immunization history on file for this patient. Health Maintenance Due  Topic Date Due  . PNA vac Low  Risk Adult (1 of 2 - PCV13) 05/23/2005  . INFLUENZA VACCINE  05/29/2017    Patient Care Team: Briscoe Deutscher, DO as PCP - General (Family Medicine) Troy Sine, MD as Consulting Physician (Cardiology)  Indicate any recent Medical Services you may have received from other than [facility] providers in the past year (date may be approximate).    Assessment:   This is a routine wellness examination for [name]. Physical assessment deferred to PCP.   Hearing/Vision screen Hearing Screening Comments: Able to hear conversational tones w/o difficulty. No issues reported.   Vision Screening Comments: Wears bifocals. Pt does not get regular vision  checks.  Dietary issues and exercise activities discussed: Current Exercise Habits: Home exercise routine, Type of exercise: stretching, Time (Minutes): 10, Frequency (Times/Week): 7, Weekly Exercise (Minutes/Week): 70, Intensity: Mild, Exercise limited by: None identified  Goals    None     Depression Screen PHQ 2/9 Scores 07/12/2017 01/14/2017  PHQ - 2 Score 6 0  PHQ- 9 Score 18 -    Fall Risk Fall Risk  07/12/2017 01/14/2017  Falls in the past year? No No    Cognitive Function: MMSE - Mini Mental State Exam 07/12/2017  Orientation to time 5  Orientation to Place 5  Registration 3  Attention/ Calculation 5  Recall 2  Language- name 2 objects 2  Language- repeat 1  Language- follow 3 step command 3  Language- read & follow direction 1  Write a sentence 1  Copy design 1  Total score 29        Screening Tests Health Maintenance  Topic Date Due  . PNA vac Low Risk Adult (1 of 2 - PCV13) 05/23/2005  . INFLUENZA VACCINE  05/29/2017  . TETANUS/TDAP  07/10/2020        Plan:   Follow up with PCP as directed.  Bring a copy of your living will and/or healthcare power of attorney to your next office visit.  I have personally reviewed and noted the following in the patient's chart:   . Medical and social history . Use of alcohol, tobacco or illicit drugs  . Current medications and supplements . Functional ability and status . Nutritional status . Physical activity . Advanced directives . List of other physicians . Vitals . Screenings to include cognitive, depression, and falls . Referrals and appointments  In addition, I have reviewed and discussed with patient certain preventive protocols, quality metrics, and best practice recommendations. A written personalized care plan for preventive services as well as general preventive health recommendations were provided to patient.     Ree Edman, RN   07/12/2017

## 2017-07-05 NOTE — Progress Notes (Signed)
PCP notes:   Health maintenance: PCV13 - pt believes he received this at [facility], will try to call on Monday.  Abnormal screenings: None.   Patient concerns: Pt states that his memory and concentration have gotten worse over the past 3-4 years. Suggested brain stimulating activities.   Pt states that he is depressed, not as bad as he has been in the past. PHQ9 score: 18  Nurse concerns: None.   Next PCP appt: 07/22/17 10:00

## 2017-07-05 NOTE — Progress Notes (Signed)
Pre visit review using our clinic review tool, if applicable. No additional management support is needed unless otherwise documented below in the visit note. 

## 2017-07-12 ENCOUNTER — Ambulatory Visit (INDEPENDENT_AMBULATORY_CARE_PROVIDER_SITE_OTHER): Payer: Medicare Other | Admitting: *Deleted

## 2017-07-12 ENCOUNTER — Telehealth: Payer: Self-pay | Admitting: *Deleted

## 2017-07-12 ENCOUNTER — Other Ambulatory Visit: Payer: Medicare Other

## 2017-07-12 ENCOUNTER — Encounter: Payer: Self-pay | Admitting: *Deleted

## 2017-07-12 ENCOUNTER — Other Ambulatory Visit: Payer: Self-pay

## 2017-07-12 VITALS — BP 124/84 | HR 74 | Resp 16 | Ht 69.0 in | Wt 190.6 lb

## 2017-07-12 DIAGNOSIS — Z5181 Encounter for therapeutic drug level monitoring: Secondary | ICD-10-CM | POA: Diagnosis not present

## 2017-07-12 DIAGNOSIS — I481 Persistent atrial fibrillation: Secondary | ICD-10-CM | POA: Diagnosis not present

## 2017-07-12 DIAGNOSIS — Z7901 Long term (current) use of anticoagulants: Secondary | ICD-10-CM | POA: Diagnosis not present

## 2017-07-12 DIAGNOSIS — Z0000 Encounter for general adult medical examination without abnormal findings: Secondary | ICD-10-CM

## 2017-07-12 DIAGNOSIS — I4819 Other persistent atrial fibrillation: Secondary | ICD-10-CM

## 2017-07-12 LAB — PROTIME-INR
INR: 4.3 — ABNORMAL HIGH
PROTHROMBIN TIME: 45 s — AB (ref 9.0–11.5)

## 2017-07-12 NOTE — Telephone Encounter (Signed)
Patient's INR came back 4.3. Spoke with Dr. Jerline Pain and he agreed for patient to hold his dosage today, take half a table tomorrow, and then continue his normal weekly dosage. Patient is scheduled to see [facility] in a week, September 21st at 11:00am. If patient continues to be out of range, overall dosage will be adjusted.

## 2017-07-12 NOTE — Progress Notes (Signed)
When checking patient's INR via fingerstick, a result of 5.3 populated. Patient stated he did not have a change in diet nor diviate from his dosage schedule. I then tested the patient again via finger stick and a result of 5.0 occurred. Due to change in number, I did a final finger stick and it was 4.7. Due to inconsistency in testing, I requested a STAT venipuncture. I spoke with Dr. Jerline Pain around the POCT INR, requesting stat venipuncture INR, and plans to get those numbers back within the next 2-3 hours. Dr. Jerline Pain agreed on plan of action.   Plan is to call patient with INR dosage when the results come back. Will again communicate with patient.

## 2017-07-12 NOTE — Progress Notes (Signed)
I have reviewed this visit and I agree on the patient's plan of dosage and recommendations. Samirah Scarpati, MD   

## 2017-07-12 NOTE — Progress Notes (Signed)
I have personally reviewed the Medicare Annual Wellness questionnaire and have noted 1. The patient's medical and social history 2. Their use of alcohol, tobacco or illicit drugs 3. Their current medications and supplements 4. The patient's functional ability including ADL's, fall risks, home safety risks and hearing or visual impairment. 5. Diet and physical activities 6. Evidence for depression or mood disorders 7. Reviewed Updated provider list, see scanned forms and [facility] Snapshot.   The patients weight, height, BMI and visual acuity have been recorded in the chart I have made referrals, counseling and provided education to the patient based review of the above and I have provided the pt with a written personalized care plan for preventive services.  I have provided the patient with a copy of your personalized plan for preventive services. Instructed to take the time to review along with their updated medication list. 

## 2017-07-12 NOTE — Patient Instructions (Addendum)
Marcus Galloway ,  Start doing brain stimulating activities (puzzles, reading, adult coloring books, staying active) to keep memory sharp.   Bring a copy of your living will and/or healthcare power of attorney to your next office visit.  Thank you for taking time to come for your Medicare Wellness Visit. I appreciate your ongoing commitment to your health goals. Please review the following plan we discussed and let me know if I can assist you in the future.    This is a list of the screening recommended for you and due dates:  Health Maintenance  Topic Date Due  . Pneumonia vaccines (1 of 2 - PCV13) 05/23/2005  . Flu Shot  05/29/2017  . Tetanus Vaccine  07/10/2020   Preventive Care for Adults  A healthy lifestyle and preventive care can promote health and wellness. Preventive health guidelines for adults include the following key practices.  . A routine yearly physical is a good way to check with your health care provider about your health and preventive screening. It is a chance to share any concerns and updates on your health and to receive a thorough exam.  . Visit your dentist for a routine exam and preventive care every 6 months. Brush your teeth twice a day and floss once a day. Good oral hygiene prevents tooth decay and gum disease.  . The frequency of eye exams is based on your age, health, family medical history, use  of contact lenses, and other factors. Follow your health care provider's ecommendations for frequency of eye exams.  . Eat a healthy diet. Foods like vegetables, fruits, whole grains, low-fat dairy products, and lean protein foods contain the nutrients you need without too many calories. Decrease your intake of foods high in solid fats, added sugars, and salt. Eat the right amount of calories for you. Get information about a proper diet from your health care provider, if necessary.  . Regular physical exercise is one of the most important things you can do for your  health. Most adults should get at least 150 minutes of moderate-intensity exercise (any activity that increases your heart rate and causes you to sweat) each week. In addition, most adults need muscle-strengthening exercises on 2 or more days a week.  Silver Sneakers may be a benefit available to you. To determine eligibility, you may visit the website: www.silversneakers.com or contact program at 4452812075 Mon-Fri between 8AM-8PM.   . Maintain a healthy weight. The body mass index (BMI) is a screening tool to identify possible weight problems. It provides an estimate of body fat based on height and weight. Your health care provider can find your BMI and can help you achieve or maintain a healthy weight.   For adults 20 years and older: ? A BMI below 18.5 is considered underweight. ? A BMI of 18.5 to 24.9 is normal. ? A BMI of 25 to 29.9 is considered overweight. ? A BMI of 30 and above is considered obese.   . Maintain normal blood lipids and cholesterol levels by exercising and minimizing your intake of saturated fat. Eat a balanced diet with plenty of fruit and vegetables. Blood tests for lipids and cholesterol should begin at age 70 and be repeated every 5 years. If your lipid or cholesterol levels are high, you are over 50, or you are at high risk for heart disease, you may need your cholesterol levels checked more frequently. Ongoing high lipid and cholesterol levels should be treated with medicines if diet and exercise are  not working.  . If you smoke, find out from your health care provider how to quit. If you do not use tobacco, please do not start.  . If you choose to drink alcohol, please do not consume more than 2 drinks per day. One drink is considered to be 12 ounces (355 mL) of beer, 5 ounces (148 mL) of wine, or 1.5 ounces (44 mL) of liquor.  . If you are 21-52 years old, ask your health care provider if you should take aspirin to prevent strokes.  . Use sunscreen. Apply  sunscreen liberally and repeatedly throughout the day. You should seek shade when your shadow is shorter than you. Protect yourself by wearing long sleeves, pants, a wide-brimmed hat, and sunglasses year round, whenever you are outdoors.  . Once a month, do a whole body skin exam, using a mirror to look at the skin on your back. Tell your health care provider of new moles, moles that have irregular borders, moles that are larger than a pencil eraser, or moles that have changed in shape or color.

## 2017-07-14 ENCOUNTER — Other Ambulatory Visit: Payer: Self-pay | Admitting: Cardiovascular Disease

## 2017-07-15 NOTE — Telephone Encounter (Signed)
Rx(s) sent to pharmacy electronically.  

## 2017-07-19 ENCOUNTER — Other Ambulatory Visit: Payer: Self-pay | Admitting: Family Medicine

## 2017-07-19 ENCOUNTER — Ambulatory Visit (INDEPENDENT_AMBULATORY_CARE_PROVIDER_SITE_OTHER): Payer: Medicare Other | Admitting: *Deleted

## 2017-07-19 DIAGNOSIS — I4891 Unspecified atrial fibrillation: Secondary | ICD-10-CM | POA: Insufficient documentation

## 2017-07-19 DIAGNOSIS — Z7901 Long term (current) use of anticoagulants: Secondary | ICD-10-CM | POA: Diagnosis not present

## 2017-07-19 DIAGNOSIS — I4819 Other persistent atrial fibrillation: Secondary | ICD-10-CM

## 2017-07-19 DIAGNOSIS — I481 Persistent atrial fibrillation: Secondary | ICD-10-CM

## 2017-07-19 LAB — POCT INR: INR: 2.8

## 2017-07-19 NOTE — Progress Notes (Signed)
I have reviewed this visit and I agree on the patient's plan of dosage and recommendations. Waller Marcussen, DO   

## 2017-07-22 ENCOUNTER — Ambulatory Visit (INDEPENDENT_AMBULATORY_CARE_PROVIDER_SITE_OTHER): Payer: Medicare Other | Admitting: Family Medicine

## 2017-07-22 ENCOUNTER — Encounter: Payer: Self-pay | Admitting: Family Medicine

## 2017-07-22 VITALS — BP 128/80 | HR 65 | Temp 97.9°F | Ht 69.0 in | Wt 188.0 lb

## 2017-07-22 DIAGNOSIS — Z0000 Encounter for general adult medical examination without abnormal findings: Secondary | ICD-10-CM | POA: Diagnosis not present

## 2017-07-22 NOTE — Progress Notes (Signed)
Subjective:  Water quality scientist, CMA, acting as scribe for Dr. [name].  Marcus Galloway is a 76 y.o. male and is here for a comprehensive physical exam.  HPI: No new concerns. Still having imbalance when standing.   Health Maintenance Due  Topic Date Due  . PNA vac Low Risk Adult (1 of 2 - PCV13) 05/23/2005  . INFLUENZA VACCINE  05/29/2017   PMHx, SurgHx, SocialHx, Medications, and Allergies were reviewed in the Visit Navigator and updated as appropriate.   Past Medical History:  Diagnosis Date  . Aortic valve disease   . Atrial fibrillation ([place])   . BPH (benign prostatic hyperplasia)   . Chronic neck pain   . GERD (gastroesophageal reflux disease)   . History of colonic polyps   . Hyperlipidemia   . Hypertension   . Osteoarthritis   . Persistent atrial fibrillation ([place])   . Polio   . Prostate cancer [facility])    Past Surgical History:  Procedure Laterality Date  . 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  . CARDIOVERSION  09/2016  . HERNIA REPAIR  1990  . INSERTION PROSTATE RADIATION SEED  2002   Family History  Problem Relation Age of Onset  . Adopted: Yes  . Family history unknown: Yes   Social History  Substance Use Topics  . Smoking status: Former Research scientist (life sciences)  . Smokeless tobacco: Never Used  . Alcohol use Yes     Comment: 2-3 drinks daily at night   Review of Systems:   Pertinent items are noted in the HPI. Otherwise, ROS is negative.  Objective:    Vitals:   07/22/17 1010  BP: 128/80  Pulse: 65  Temp: 97.9 F (36.6 C)  SpO2: 97%   Body mass index is 27.76 kg/m.  General  Alert, cooperative, no distress, appears stated age  Head:  Normocephalic, without obvious abnormality, atraumatic  Eyes:  PERRL, conjunctiva/corneas clear, EOM's intact, fundi benign, both eyes       Ears:  Normal TM's and external ear canals, both ears  Nose: Nares normal, septum midline, mucosa normal, no drainage or sinus tenderness  Throat: Lips, mucosa, and  tongue normal; teeth and gums normal  Neck: Supple, symmetrical, trachea midline, no adenopathy; thyroid: no enlargement/tenderness/nodules; no carotid bruit or JVD  Back:   Symmetric, no curvature, ROM normal, no CVA tenderness  Lungs:   Clear to auscultation bilaterally, respirations unlabored  Chest Wall:  No tenderness or deformity  Heart:  Regular rate and rhythm, S1 and S2 normal, no murmur, rub or gallop  Abdomen:   Soft, non-tender, bowel sounds active all four quadrants, no masses, no organomegaly  Extremities: Extremities normal, atraumatic, no cyanosis or edema  Prostate : Not done   Skin: Skin color, texture, turgor normal, no rashes or lesions  Lymph: Cervical, supraclavicular, and axillary nodes normal  Neurologic: CNII-XII grossly intact. Normal strength, sensation and reflexes throughout   AssessmentPlan:   Sayeed was seen today for annual exam.  Diagnoses and all orders for this visit:  Routine physical examination Comments: Stable. Still having imbalance issues. Will offer PT for further strengthening and possible vestibular rehab.     Patient Counseling: [x]   Nutrition: Stressed importance of moderation in sodium/caffeine intake, saturated fat and cholesterol, caloric balance, sufficient intake of fresh fruits, vegetables, and fiber  [x]   Stressed the importance of regular exercise.   []   Substance Abuse: Discussed cessation/primary prevention of tobacco, alcohol, or other drug use; driving or other dangerous activities  under the influence; availability of treatment for abuse.   [x]   Injury prevention: Discussed safety belts, safety helmets, smoke detector, smoking near bedding or upholstery.   []   Sexuality: Discussed sexually transmitted diseases, partner selection, use of condoms, avoidance of unintended pregnancy  and contraceptive alternatives.   [x]   Dental health: Discussed importance of regular tooth brushing, flossing, and dental visits.  [x]   Health  maintenance and immunizations reviewed. Please refer to Health maintenance section.    Briscoe Deutscher, DO [place] served as Education administrator during this visit. History, Physical, and Plan performed by medical provider. The above documentation has been reviewed and is accurate and complete. Briscoe Deutscher, D.O.

## 2017-07-23 ENCOUNTER — Ambulatory Visit (INDEPENDENT_AMBULATORY_CARE_PROVIDER_SITE_OTHER): Payer: Medicare Other | Admitting: Cardiovascular Disease

## 2017-07-23 ENCOUNTER — Encounter: Payer: Self-pay | Admitting: Cardiovascular Disease

## 2017-07-23 VITALS — BP 139/86 | HR 47 | Ht 69.0 in | Wt 189.0 lb

## 2017-07-23 DIAGNOSIS — R42 Dizziness and giddiness: Secondary | ICD-10-CM

## 2017-07-23 DIAGNOSIS — I482 Chronic atrial fibrillation: Secondary | ICD-10-CM

## 2017-07-23 DIAGNOSIS — Z952 Presence of prosthetic heart valve: Secondary | ICD-10-CM | POA: Diagnosis not present

## 2017-07-23 DIAGNOSIS — Z7901 Long term (current) use of anticoagulants: Secondary | ICD-10-CM | POA: Diagnosis not present

## 2017-07-23 DIAGNOSIS — E785 Hyperlipidemia, unspecified: Secondary | ICD-10-CM

## 2017-07-23 DIAGNOSIS — Z79899 Other long term (current) drug therapy: Secondary | ICD-10-CM | POA: Diagnosis not present

## 2017-07-23 DIAGNOSIS — I4821 Permanent atrial fibrillation: Secondary | ICD-10-CM

## 2017-07-23 MED ORDER — METOPROLOL TARTRATE 50 MG PO TABS: ORAL_TABLET | ORAL | 3 refills | Status: DC

## 2017-07-23 NOTE — Patient Instructions (Signed)
Medication Instructions:  DECREASE metoprolol tartrate (Lopressor) to 50 mg (1 tablet) two times daily  Follow-Up: Your physician wants you to follow-up in: 6 MONTHS with Dr. Claiborne Billings. You will receive a reminder letter in the mail two months in advance. If you don't receive a letter, please call our office to schedule the follow-up appointment.   Any Other Special Instructions Will Be Listed Below (If Applicable).     If you need a refill on your cardiac medications before your next appointment, please call your pharmacy.

## 2017-07-23 NOTE — Progress Notes (Signed)
Cardiology Office Note    Date:  07/28/2017   ID:  Marcus Galloway, DOB June 23, 1941, MRN [id number]  PCP:  Marcus Deutscher, DO  Cardiologist:  Marcus Majestic, MD   Chief Complaint  Patient presents with  . Follow-up    4 months    History of Present Illness:  Marcus Galloway is a 76 y.o. male Caucasian male who moved to [place] from [place].Marcus Galloway  He established cardiology care with me In March 2018.  He presents for 4 month follow-up evaluation.  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  He was found to be in atrial fibrillation on routine office visit in November 2017.  Rate control and anticoagulation were initiated at that time and ultimately underwent cardioversion.  He develop recurrent atrial fibrillation and underwent sotalol loading with repeat cardioversion.  An echo Doppler study had shown normal LV size and function with an EF of 50-55%, biatrial enlargement, mild-to-moderate MR, moderate TR, and a normal prosthetic aortic valve.  When last seen by his cardiologist, Dr. Kellie Galloway in  [facility] on 12/10/2016 , he was back in atrial fibrillation.  At that time, eliquis was discontinued as was sotalol and he was started on metoprolol tartrate 75 mg twice a day and Coumadin for anticoagulation.  He has subsequently moved to [place] and establish care with me.    When I initially saw him, he denied He denies any episodes of chest pain.  He reportedly had undergone cardiac catheterization prior to his valve replacement and his coronary arteries were clean.  Recently, he had noticed exertional dyspnea.  He is unaware of rapid heartbeats on his current dose of metoprolol.  He admits to adequate sleep.  He is unaware of awakening gasping for breath, and he believes his sleep is restorative.  When I initially saw him his  ECG revealed atrial fibrillation with ventricular rate in the 80s.  Suggested discontinuance of amlodipine and change to diltiazem CD 180 mg for improved rate control as well.  Blood pressure control.  I recommended a follow-up echo Doppler study, and this was done on 02/01/2017.  This revealed an ejection fraction of 60-65%.  He had normal wall motion without regional wall motion abnormalities.  His bioprosthetic aortic valve was well-seated, without obstruction and only with trivial regurgitation. He had severe biatrial enlargement.  There was mild pulmonary hypertension at 36 mm with mild TR.    Since I last saw him, he has noticed some mild episodes of dizziness and also some vague chest wall discomfort.  He admits to being sleepy during the day.  He typically goes to bed at 11 PM and wakes up at 7 AM.  Laboratory in May 2018 was stable.  His cholesterol was 151, LDL cholesterol 68, triglycerides 130, HDL 57.  He currently has been taking atorvastatin 10 mg, metoprolol 75 mg twice a day, warfarin anticoagulation, Cartia XT 180 mg in addition to Flomax.  He presents for reevaluation  Past Medical History:  Diagnosis Date  . Aortic valve disease   . Atrial fibrillation ([place])   . BPH (benign prostatic hyperplasia)   . Chronic neck pain   . GERD (gastroesophageal reflux disease)   . History of colonic polyps   . Hyperlipidemia   . Hypertension   . Osteoarthritis   . Persistent atrial fibrillation ([place])   . Polio   .  Prostate cancer [facility])     Past Surgical History:  Procedure Laterality Date  . 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  . CARDIOVERSION  09/2016  . HERNIA REPAIR  1990  . INSERTION PROSTATE RADIATION SEED  2002    Current Medications: Outpatient Medications Prior to Visit  Medication Sig Dispense Refill  . atorvastatin (LIPITOR) 10 MG tablet Take 1 tablet (10 mg total) by mouth daily. 30 tablet 4  . CARTIA XT 180 MG 24 hr capsule TAKE ONE CAPSULE BY  MOUTH DAILY 30 capsule 0  . doxycycline (ADOXA) 50 MG tablet Take 1 tablet (50 mg total) by mouth daily. (Patient taking differently: Take 25 mg by mouth daily. ) 30 tablet 3  . OMEPRAZOLE PO Take 20 mg by mouth daily.    Marcus Galloway oxyCODONE-acetaminophen (ROXICET) 5-325 MG tablet Take 1 tablet by mouth 2 (two) times daily as needed for severe pain. 60 tablet 0  . tamsulosin (FLOMAX) 0.4 MG CAPS capsule Take 1 capsule (0.4 mg total) by mouth daily. 30 capsule 3  . warfarin (COUMADIN) 5 MG tablet Take 5 mg by mouth daily.    . metoprolol tartrate (LOPRESSOR) 50 MG tablet TAKE 1 AND 1/2 TABLETS BY MOUTH 2 TIMES A DAY 90 tablet 2   No facility-administered medications prior to visit.      Allergies:   Patient has no known allergies.   Social History   Social History  . Marital status: Widowed    Spouse name: N/A  . Number of children: 4  . Years of education: 12   Occupational History  . Retired    Social History Main Topics  . Smoking status: Former Research scientist (life sciences)  . Smokeless tobacco: Never Used  . Alcohol use Yes     Comment: 2-3 drinks daily at night  . Drug use: No  . Sexual activity: Not Currently   Other Topics Concern  . None   Social History Narrative   Lives alone, recently moved back to [place] from [facility] to be closer to his children   Right-handed   Caffeine: 3 cups each morning    Social history is notable in that he is widowed.  He is moved back here to be close to his children.  He had smoked remotely from age 87 until age 56 but quit in 39.  He has 4 children.  One daughter had undergone aortic valve replacement.  He has a half-sister but her whereabouts is unknown.  He drinks occasional alcohol with wine or scotch.   Family History:  He was adopted. Family history is unknown by patient.  However, his biological parents are deceased and he was able to find through records that his mother died at age 27 and his father died at age 69.  ROS General: Negative; No fevers, chills, or  night sweats;  HEENT: Negative; No changes in vision or hearing, sinus congestion, difficulty swallowing Pulmonary: Negative; No cough, wheezing, shortness of breath, hemoptysis Cardiovascular: see HPI GI: History of GERD on omeprazole GU: He has a history of prostate CA and underwent radiation seeds in 2001. Musculoskeletal: Negative; no myalgias, joint pain , or weakness/  he had polio at age 8-6 and was in the hospital for 5 months. Hematologic/Oncology: Negative; no easy bruising, bleeding Endocrine: Negative; no heat/cold intolerance; no diabetes Neuro: Negative; no changes in balance, headaches Skin: Negative; No rashes or skin lesions Psychiatric: Negative; No behavioral problems, depression Sleep: Negative; No snoring, daytime sleepiness, hypersomnolence, bruxism, restless  legs, hypnogognic hallucinations, no cataplexy Other comprehensive 14 point system review is negative.   PHYSICAL EXAM:   VS:  BP 139/86   Pulse (!) 47   Ht 5' 9" (1.753 m)   Wt 189 lb (85.7 kg)   BMI 27.91 kg/m     Wt Readings from Last 3 Encounters:  07/23/17 189 lb (85.7 kg)  07/22/17 188 lb (85.3 kg)  07/12/17 190 lb 9.6 oz (86.5 kg)    General: Alert, oriented, no distress.  Skin: normal turgor, no rashes, warm and dry HEENT: Normocephalic, atraumatic. Pupils equal round and reactive to light; sclera anicteric; extraocular muscles intact;  Nose without nasal septal hypertrophy Mouth/Parynx benign; Mallinpatti scale 3 Neck: No JVD, no carotid bruits; normal carotid upstroke Lungs: clear to ausculatation and percussion; no wheezing or rales Chest wall: without tenderness to palpitation Heart: PMI not displaced, irregularly irregular rhythm compatible with atrial fibrillation with slow ventricular response in the upper 40s, s1 s2 normal, 1/6 systolic murmur, no diastolic murmur, no rubs, gallops, thrills, or heaves Abdomen: soft, nontender; no hepatosplenomehaly, BS+; abdominal aorta nontender and  not dilated by palpation. Back: no CVA tenderness Pulses 2+ Musculoskeletal: full range of motion, normal strength, no joint deformities Extremities: no clubbing cyanosis or edema, Homan's sign negative  Neurologic: grossly nonfocal; Cranial nerves grossly wnl Psychologic: Normal mood and affect   Studies/Labs Reviewed:   EKG:  EKG is ordered today. Atrial fibrillation with a slow ventricular response with ventricular rate at 47 bpm.  QRS complex V1 to V3.  Left axis deviation.  QTc interval 35 ms.  May 2018 ECG (independently read by me): Atrial fibrillation at 74 bpm.  Left anterior hemiblock.  QRS complex V1 and V2.  Nonspecific ST changes.  QTc interval 461 ms.  01/22/2017 ECG (independently read by me): Atrial fibrillation at a rate of 84 bpm.  Left anterior hemiblock.  Poor progression V1 through V3.  No significant ST segment changes.  Recent Labs: BMP Latest Ref Rng & Units 02/26/2017  Glucose 65 - 99 mg/dL 101(H)  BUN 7 - 25 mg/dL 8  Creatinine 0.70 - 1.18 mg/dL 0.78  Sodium 135 - 146 mmol/L 142  Potassium 3.5 - 5.3 mmol/L 4.4  Chloride 98 - 110 mmol/L 104  CO2 20 - 31 mmol/L 26  Calcium 8.6 - 10.3 mg/dL 9.1     Hepatic Function Latest Ref Rng & Units 02/26/2017  Total Protein 6.1 - 8.1 g/dL 6.4  Albumin 3.6 - 5.1 g/dL 4.1  AST 10 - 35 U/L 25  ALT 9 - 46 U/L 21  Alk Phosphatase 40 - 115 U/L 90  Total Bilirubin 0.2 - 1.2 mg/dL 0.8    CBC Latest Ref Rng & Units 02/26/2017  WBC 3.8 - 10.8 K/uL 7.2  Hemoglobin 13.2 - 17.1 g/dL 14.5  Hematocrit 38.5 - 50.0 % 43.7  Platelets 140 - 400 K/uL 211   Lab Results  Component Value Date   MCV 94.0 02/26/2017   Lab Results  Component Value Date   TSH 1.89 02/26/2017   No results found for: HGBA1C   BNP No results found for: BNP  ProBNP No results found for: PROBNP   Lipid Panel     Component Value Date/Time   CHOL 151 02/26/2017 0808   TRIG 130 02/26/2017 0808   HDL 57 02/26/2017 0808   CHOLHDL 2.6 02/26/2017  0808   VLDL 26 02/26/2017 0808   LDLCALC 68 02/26/2017 0808     RADIOLOGY: No results found.  Additional studies/ records that were reviewed today include:  I reviewed the office records from Dr. Bunnie Philips at  [facility] in [place], [place].  The echo was reviewed    ASSESSMENT:    1. Permanent atrial fibrillation ([place])   2. Anticoagulation adequate   3. S/P aortic valve replacement   4. Medication management   5. Dizziness   6. Hyperlipidemia, unspecified hyperlipidemia type      PLAN:  Marcus Galloway is a 75 year old gentleman who unaware of his family history as he was abandoned by his biologic parents and sent to a foster home.  He underwent aortic valve replacement surgery which I presume was aortic stenosis and had a 25 mmEdwards pericardial valve inserted.  At the time of his surgery, he had normal coronary arteries.  He is now in permanent atrial fibrillation, having undergone 4 attempts at cardioversion with recurrent atrial fibrillation.  As result, he now has permanent atrial fibrillation and is on warfarin anticoagulation.  His 2-D echo Doppler study from 02/01/2017 shows mild LVH with normal systolic function.  His bioprosthetic aortic valve is well-seated without stenosis and with only trivial AR.  He has severe biatrial enlargement undoubtedly contributing to his recurrent atrial fibrillation.  There is mild pulmonary  hypertension.  He now feels significant better with the change to diltiazem from amlodipine.  His blood pressure today is upper normal.  He recently has experienced some episodes of dizziness.  I will reduce his metoprolol down to 50 mg twice a day.  He notes some vague chest wall discomfort which is nonischemic in etiology.  He denies bleeding on warfarin.  His laboratory was reviewed and LDL cholesterol was 68 on low-dose atorvastatin.  I will see him in 6 months for reevaluation  Medication Adjustments/Labs and Tests  Ordered: Current medicines are reviewed at length with the patient today.  Concerns regarding medicines are outlined above.  Medication changes, Labs and Tests ordered today are listed in the Patient Instructions below.  Patient Instructions  Medication Instructions:  DECREASE metoprolol tartrate (Lopressor) to 50 mg (1 tablet) two times daily  Follow-Up: Your physician wants you to follow-up in: 6 MONTHS with Dr. Claiborne Billings. You will receive a reminder letter in the mail two months in advance. If you don't receive a letter, please call our office to schedule the follow-up appointment.   Any Other Special Instructions Will Be Listed Below (If Applicable).     If you need a refill on your cardiac medications before your next appointment, please call your pharmacy.     Signed, Marcus Majestic, MD, [facility]  07/28/2017 11:17 PM    [facility] [address] [place], [place], [place]  [address] Phone: [phone]

## 2017-07-30 ENCOUNTER — Ambulatory Visit (INDEPENDENT_AMBULATORY_CARE_PROVIDER_SITE_OTHER): Payer: Medicare Other | Admitting: Family Medicine

## 2017-07-30 DIAGNOSIS — Z23 Encounter for immunization: Secondary | ICD-10-CM | POA: Diagnosis not present

## 2017-08-10 ENCOUNTER — Other Ambulatory Visit: Payer: Self-pay | Admitting: Cardiovascular Disease

## 2017-08-16 ENCOUNTER — Ambulatory Visit: Payer: Medicare Other

## 2017-08-20 ENCOUNTER — Ambulatory Visit (INDEPENDENT_AMBULATORY_CARE_PROVIDER_SITE_OTHER): Payer: Medicare Other | Admitting: *Deleted

## 2017-08-20 ENCOUNTER — Other Ambulatory Visit (INDEPENDENT_AMBULATORY_CARE_PROVIDER_SITE_OTHER): Payer: Medicare Other

## 2017-08-20 ENCOUNTER — Telehealth: Payer: Self-pay

## 2017-08-20 DIAGNOSIS — I481 Persistent atrial fibrillation: Secondary | ICD-10-CM | POA: Diagnosis not present

## 2017-08-20 DIAGNOSIS — Z7901 Long term (current) use of anticoagulants: Secondary | ICD-10-CM

## 2017-08-20 DIAGNOSIS — I4819 Other persistent atrial fibrillation: Secondary | ICD-10-CM

## 2017-08-20 LAB — PROTIME-INR
INR: 5.1 ratio — ABNORMAL HIGH (ref 0.8–1.0)
Prothrombin Time: 54.5 s (ref 9.6–13.1)

## 2017-08-20 LAB — POCT INR: INR: 6.7

## 2017-08-20 NOTE — Telephone Encounter (Signed)
Lab called with critical PT-INR result on pt. PT 54.5 and INR 5.1.

## 2017-08-20 NOTE — Progress Notes (Signed)
Patient stated there has been no change in his diet nor his medications.   -STAT INR/PT venipuncture lab draw ordered -Patient placed on and is aware of bleeding precautions -Educated on falling, going to ED if bleeding does occur   Plans to call patient in the morning with results. Spoke with Dr. [name] while patient is here in the office and she agrees on the plan.

## 2017-08-20 NOTE — Telephone Encounter (Signed)
Forwarding STAT lab results.

## 2017-08-21 ENCOUNTER — Ambulatory Visit (INDEPENDENT_AMBULATORY_CARE_PROVIDER_SITE_OTHER): Payer: Medicare Other | Admitting: Neurology

## 2017-08-21 ENCOUNTER — Ambulatory Visit (INDEPENDENT_AMBULATORY_CARE_PROVIDER_SITE_OTHER): Payer: Medicare Other | Admitting: Family Medicine

## 2017-08-21 ENCOUNTER — Other Ambulatory Visit: Payer: Self-pay | Admitting: Family Medicine

## 2017-08-21 ENCOUNTER — Encounter: Payer: Self-pay | Admitting: Neurology

## 2017-08-21 VITALS — BP 120/80 | HR 53

## 2017-08-21 DIAGNOSIS — G3281 Cerebellar ataxia in diseases classified elsewhere: Secondary | ICD-10-CM

## 2017-08-21 DIAGNOSIS — R270 Ataxia, unspecified: Secondary | ICD-10-CM

## 2017-08-21 DIAGNOSIS — R42 Dizziness and giddiness: Secondary | ICD-10-CM | POA: Diagnosis not present

## 2017-08-21 DIAGNOSIS — I4819 Other persistent atrial fibrillation: Secondary | ICD-10-CM

## 2017-08-21 DIAGNOSIS — Z7901 Long term (current) use of anticoagulants: Secondary | ICD-10-CM

## 2017-08-21 DIAGNOSIS — I481 Persistent atrial fibrillation: Secondary | ICD-10-CM

## 2017-08-21 NOTE — Patient Instructions (Signed)
1.  We will check MRI of brain with and without contrast 2.  Treatment is really supportive (physical therapy) but sometimes an antidepressant may help with dizziness.

## 2017-08-21 NOTE — Progress Notes (Signed)
NEUROLOGY CONSULTATION NOTE  Marcus Galloway MRN: [id number] DOB: 12-29-1940  Referring provider: Dr. [name] Primary care provider: DR. [name]  Reason for consult:  Imbalance, dizziness  HISTORY OF PRESENT ILLNESS: Marcus Galloway is a 76 year old right handed male with persistent atrial fibrillation, CHF, chronic neck and back pain and history of polio who presents for dizziness and imbalance.  History supplemented by PCP note.  He has history of dizziness and unsteady gait for about 7 years.  He reports a constant dizziness described as a spinning sensation that fluctuates and is worse with standing up or bending over.  It may last a couple of minutes.  There is no associated double vision, slurred speech, trouble swallowing, facial numbness or hearing loss.  He notes history of tinnitus.  Sometimes he may feel nauseous.  Nothing really makes it better except for keeping still.  He has undergone vestibular rehabilitation which was initially helpful but then was ultimately ineffective.  He takes Percocet for chronic neck and back pain.  He drinks bourbon daily.  He had polio as a child.  02/26/17 LABS:  CBC with WBC 7.2, HGB 14.5, HCT 43.7, PLT 211; CMP with Na 142, K 4.4, Cl 104, CO2 26, glucose 101, BUN 8, Cr 0.78, t bili 0.8, ALP 90, AST 25 and ALT 21; TSH 1.89  PAST MEDICAL HISTORY: Past Medical History:  Diagnosis Date  . Aortic valve disease   . Atrial fibrillation ([place])   . BPH (benign prostatic hyperplasia)   . Chronic neck pain   . GERD (gastroesophageal reflux disease)   . History of colonic polyps   . Hyperlipidemia   . Hypertension   . Osteoarthritis   . Persistent atrial fibrillation ([place])   . Polio   . Prostate cancer ([place])     PAST SURGICAL HISTORY: Past Surgical History:  Procedure Laterality Date  . 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  . CARDIOVERSION  09/2016  . HERNIA REPAIR  1990  . INSERTION PROSTATE RADIATION SEED  2002     MEDICATIONS: Current Outpatient Prescriptions on File Prior to Visit  Medication Sig Dispense Refill  . atorvastatin (LIPITOR) 10 MG tablet Take 1 tablet (10 mg total) by mouth daily. 30 tablet 4  . CARTIA XT 180 MG 24 hr capsule TAKE ONE CAPSULE BY MOUTH DAILY 30 capsule 3  . doxycycline (ADOXA) 50 MG tablet Take 1 tablet (50 mg total) by mouth daily. (Patient taking differently: Take 25 mg by mouth daily. ) 30 tablet 3  . metoprolol tartrate (LOPRESSOR) 50 MG tablet TAKE 1 TABLET BY MOUTH 2 TIMES A DAY 180 tablet 3  . OMEPRAZOLE PO Take 20 mg by mouth daily.    Marland Kitchen oxyCODONE-acetaminophen (ROXICET) 5-325 MG tablet Take 1 tablet by mouth 2 (two) times daily as needed for severe pain. 60 tablet 0  . tamsulosin (FLOMAX) 0.4 MG CAPS capsule Take 1 capsule (0.4 mg total) by mouth daily. 30 capsule 3  . warfarin (COUMADIN) 5 MG tablet Take 5 mg by mouth daily.     No current facility-administered medications on file prior to visit.     ALLERGIES: No Known Allergies  FAMILY HISTORY: Family History  Problem Relation Age of Onset  . Adopted: Yes  . Family history unknown: Yes    SOCIAL HISTORY: Social History   Social History  . Marital status: Widowed    Spouse name: N/A  . Number of children: 4  . Years of education: 76  Occupational History  . Retired    Social History Main Topics  . Smoking status: Former Research scientist (life sciences)  . Smokeless tobacco: Never Used  . Alcohol use Yes     Comment: 2-3 drinks daily at night  . Drug use: No  . Sexual activity: Not Currently   Other Topics Concern  . Not on file   Social History Narrative   Lives alone, recently moved back to [place] from [facility] to be closer to his children   Right-handed   Caffeine: 3 cups each morning    REVIEW OF SYSTEMS: Constitutional: No fevers, chills, or sweats, no generalized fatigue, change in appetite Eyes: No visual changes, double vision, eye pain Ear, nose and throat: No hearing loss, ear pain, nasal  congestion, sore throat Cardiovascular: No chest pain, palpitations Respiratory:  No shortness of breath at rest or with exertion, wheezes GastrointestinaI: No nausea, vomiting, diarrhea, abdominal pain, fecal incontinence Genitourinary:  No dysuria, urinary retention or frequency Musculoskeletal:  No neck pain, back pain Integumentary: No rash, pruritus, skin lesions Neurological: as above Psychiatric: No depression, insomnia, anxiety Endocrine: No palpitations, fatigue, diaphoresis, mood swings, change in appetite, change in weight, increased thirst Hematologic/Lymphatic:  No purpura, petechiae. Allergic/Immunologic: no itchy/runny eyes, nasal congestion, recent allergic reactions, rashes  PHYSICAL EXAM: Vitals:   08/21/17 1000 08/21/17 1001  BP: 118/80 120/80  Pulse: (!) 54 (!) 53  SpO2: 98% (!) 53%   General: No acute distress.  Patient appears well-groomed.  Head:  Normocephalic/atraumatic Eyes:  fundi examined but not visualized Neck: supple, no paraspinal tenderness, full range of motion Back: No paraspinal tenderness Heart: regular rate and rhythm Lungs: Clear to auscultation bilaterally. Vascular: No carotid bruits. Neurological Exam: Mental status: alert and oriented to person, place, and time, recent and remote memory intact, fund of knowledge intact, attention and concentration intact, speech fluent and not dysarthric, language intact. Cranial nerves: CN I: not tested CN II: pupils equal, round and reactive to light, visual fields intact CN III, IV, VI:  full range of motion, no nystagmus, no ptosis CN V: facial sensation intact CN VII: upper and lower face symmetric CN VIII: hearing intact CN IX, X: gag intact, uvula midline CN XI: sternocleidomastoid and trapezius muscles intact CN XII: tongue midline Bulk & Tone: normal, no fasciculations. Motor:  5/5 throughout  Sensation:  Pinprick sensation intact and vibration sensation reduced in the feet. Deep Tendon  Reflexes:  3+ throughout, toes downgoing. Finger to nose testing:  Without dysmetria.  Heel to shin:  Without dysmetria.  Gait:  Unsteady gait, veers to either side.  Able to turn but unable to tandem walk. Romberg with sway.  IMPRESSION: 1.  Vertigo.  Unclear etiology but may be due to cerebrovascular disease 2.  Unsteady gait.  He says it is associated with feeling dizzy but also may be multifactorial.  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  History of daily alcohol (liquer) consumption and chronic use of opioids may be contributing.    PLAN: 1.  MRI of brain with and without contrast to assess cerebrovascular disease or mass lesion in posterior fossa. 2.  Ultimately, treatment would be symptomatic management.  If vestibular rehab ineffective, then consider SSRI or SNRI.  He defers at this time.  Also consider tapering off opioids/switching to another pain reliever and drinking cessation.  Thank you for allowing me to take part in the care of this patient.  Metta Clines, DO  CC: Briscoe Deutscher, DO

## 2017-08-21 NOTE — Progress Notes (Signed)
Called patient to make aware of levels, change in dosage schedule, and to recheck in 1 week. Patient said he was at an appointment and would call out office once appointment was complete to review changes. Awaiting call back.   Spoke with patient. He is aware of instructions and printed handout of Anti-coagulation schedule has been given. Patient has appointment next Wednesday, October 31st at 11:00am in lab for repeat INR.

## 2017-08-23 NOTE — Progress Notes (Signed)
I have reviewed this visit and I agree on the patient's plan of dosage and recommendations. Enrico Eaddy, DO   

## 2017-08-26 ENCOUNTER — Other Ambulatory Visit: Payer: Self-pay | Admitting: Neurology

## 2017-08-26 DIAGNOSIS — T1590XA Foreign body on external eye, part unspecified, unspecified eye, initial encounter: Secondary | ICD-10-CM

## 2017-08-28 ENCOUNTER — Ambulatory Visit (INDEPENDENT_AMBULATORY_CARE_PROVIDER_SITE_OTHER): Payer: Medicare Other | Admitting: General Practice

## 2017-08-28 ENCOUNTER — Other Ambulatory Visit: Payer: Medicare Other

## 2017-08-28 DIAGNOSIS — Z7901 Long term (current) use of anticoagulants: Secondary | ICD-10-CM | POA: Diagnosis not present

## 2017-08-28 LAB — POCT INR: INR: 5.5

## 2017-08-28 NOTE — Patient Instructions (Signed)
Pre visit review using our clinic review tool, if applicable. No additional management support is needed unless otherwise documented below in the visit note. 

## 2017-08-28 NOTE — Progress Notes (Signed)
I agree with this plan.

## 2017-09-04 ENCOUNTER — Ambulatory Visit (INDEPENDENT_AMBULATORY_CARE_PROVIDER_SITE_OTHER): Payer: Medicare Other | Admitting: General Practice

## 2017-09-04 DIAGNOSIS — Z7901 Long term (current) use of anticoagulants: Secondary | ICD-10-CM | POA: Diagnosis not present

## 2017-09-04 LAB — POCT INR: INR: 2.3

## 2017-09-04 NOTE — Patient Instructions (Signed)
Pre visit review using our clinic review tool, if applicable. No additional management support is needed unless otherwise documented below in the visit note. 

## 2017-09-04 NOTE — Progress Notes (Signed)
I agree with this plan.

## 2017-09-05 ENCOUNTER — Ambulatory Visit
Admission: RE | Admit: 2017-09-05 | Discharge: 2017-09-05 | Disposition: A | Discharge status: Home / Self Care | Payer: Medicare Other | Source: Ambulatory Visit | Attending: Neurology | Admitting: Neurology

## 2017-09-05 DIAGNOSIS — G3281 Cerebellar ataxia in diseases classified elsewhere: Secondary | ICD-10-CM

## 2017-09-05 DIAGNOSIS — Z01818 Encounter for other preprocedural examination: Secondary | ICD-10-CM | POA: Diagnosis not present

## 2017-09-05 DIAGNOSIS — R42 Dizziness and giddiness: Secondary | ICD-10-CM

## 2017-09-05 DIAGNOSIS — T1590XA Foreign body on external eye, part unspecified, unspecified eye, initial encounter: Secondary | ICD-10-CM

## 2017-09-05 DIAGNOSIS — R270 Ataxia, unspecified: Secondary | ICD-10-CM

## 2017-09-05 MED ORDER — GADOBENATE DIMEGLUMINE 529 MG/ML IV SOLN
15.0000 mL | Freq: Once | INTRAVENOUS | Status: AC | PRN
  Administered 2017-09-05: 15 mL via INTRAVENOUS

## 2017-09-09 ENCOUNTER — Telehealth: Payer: Self-pay | Admitting: Neurology

## 2017-09-09 NOTE — Telephone Encounter (Signed)
Patient's daughter called and said that he had an MRI and that he has not told his children what the results were and she was just checking in. Please Call. Thanks

## 2017-09-10 ENCOUNTER — Encounter: Payer: Self-pay | Admitting: Neurology

## 2017-09-10 ENCOUNTER — Ambulatory Visit: Payer: Medicare Other | Admitting: Neurology

## 2017-09-10 VITALS — BP 120/86 | HR 88 | Ht 69.0 in | Wt 191.8 lb

## 2017-09-10 DIAGNOSIS — I680 Cerebral amyloid angiopathy: Secondary | ICD-10-CM

## 2017-09-10 DIAGNOSIS — F1020 Alcohol dependence, uncomplicated: Secondary | ICD-10-CM

## 2017-09-10 DIAGNOSIS — I481 Persistent atrial fibrillation: Secondary | ICD-10-CM

## 2017-09-10 DIAGNOSIS — I4819 Other persistent atrial fibrillation: Secondary | ICD-10-CM

## 2017-09-10 NOTE — Progress Notes (Addendum)
NEUROLOGY FOLLOW UP OFFICE NOTE  [name] [id number]  HISTORY OF PRESENT ILLNESS: Marcus Galloway is a 76 year old right handed male with persistent atrial fibrillation, CHF, chronic neck and back pain and history of polio who follows up for dizziness and MRI results.  He is accompanied by his daughter who supplements history.  UPDATE: MRI of brain from 09/05/17 was personally reviewed and revealed chronic small vessel disease but also chronic microhemorrhages in the cerebral white matter, in pattern consistent with cerebral amyloid angiopathy.  There are very few microhemorrhages in the cerebellum as well.     HISTORY: He has history of dizziness and unsteady gait for about 7 years.  He reports a constant dizziness described as a spinning sensation that fluctuates and is worse with standing up or bending over.  It may last a couple of minutes.  There is no associated double vision, slurred speech, trouble swallowing, facial numbness or hearing loss.  He notes history of tinnitus.  Sometimes he may feel nauseous.  Nothing really makes it better except for keeping still.  He has undergone vestibular rehabilitation which was initially helpful but then was ultimately ineffective.  He takes Percocet for chronic neck and back pain.  He drinks bourbon/3 to 4 cocktails daily.  He had polio as a child.  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  PAST MEDICAL HISTORY: Past Medical History:  Diagnosis Date  . Aortic valve disease   . Atrial fibrillation ([place])   . BPH (benign prostatic hyperplasia)   . Chronic neck pain   . GERD (gastroesophageal reflux disease)   . History of colonic polyps   . Hyperlipidemia   . Hypertension   . Osteoarthritis   . Persistent atrial fibrillation ([place])   . Polio   . Prostate cancer [facility])     MEDICATIONS: Current Outpatient Medications on File Prior to Visit  Medication Sig Dispense Refill    . atorvastatin (LIPITOR) 10 MG tablet Take 1 tablet (10 mg total) by mouth daily. 30 tablet 4  . CARTIA XT 180 MG 24 hr capsule TAKE ONE CAPSULE BY MOUTH DAILY 30 capsule 3  . doxycycline (ADOXA) 50 MG tablet Take 1 tablet (50 mg total) by mouth daily. (Patient taking differently: Take 25 mg by mouth daily. ) 30 tablet 3  . metoprolol tartrate (LOPRESSOR) 50 MG tablet TAKE 1 TABLET BY MOUTH 2 TIMES A DAY 180 tablet 3  . OMEPRAZOLE PO Take 20 mg by mouth daily.    Marland Kitchen oxyCODONE-acetaminophen (ROXICET) 5-325 MG tablet Take 1 tablet by mouth 2 (two) times daily as needed for severe pain. 60 tablet 0  . tamsulosin (FLOMAX) 0.4 MG CAPS capsule Take 1 capsule (0.4 mg total) by mouth daily. 30 capsule 3  . warfarin (COUMADIN) 5 MG tablet Take 5 mg by mouth daily.     No current facility-administered medications on file prior to visit.     ALLERGIES: No Known Allergies  FAMILY HISTORY: Family History  Adopted: Yes  Family history unknown: Yes    SOCIAL HISTORY: Social History   Socioeconomic History  . Marital status: Widowed    Spouse name: Not on file  . Number of children: 4  . Years of education: 36  . Highest education level: Not on file  Social Needs  . Financial resource strain: Not on file  . Food insecurity - worry: Not on file  . Food insecurity - inability: Not on file  .  Transportation needs - medical: Not on file  . Transportation needs - non-medical: Not on file  Occupational History  . Occupation: Retired  Tobacco Use  . Smoking status: Former Research scientist (life sciences)  . Smokeless tobacco: Never Used  Substance and Sexual Activity  . Alcohol use: Yes    Comment: 2-3 drinks daily at night  . Drug use: No  . Sexual activity: Not Currently  Other Topics Concern  . Not on file  Social History Narrative   Lives alone, recently moved back to [place] from [facility] to be closer to his children   Right-handed   Caffeine: 3 cups each morning    REVIEW OF SYSTEMS: Constitutional: No fevers,  chills, or sweats, no generalized fatigue, change in appetite Eyes: No visual changes, double vision, eye pain Ear, nose and throat: No hearing loss, ear pain, nasal congestion, sore throat Cardiovascular: No chest pain, palpitations Respiratory:  No shortness of breath at rest or with exertion, wheezes GastrointestinaI: No nausea, vomiting, diarrhea, abdominal pain, fecal incontinence Genitourinary:  No dysuria, urinary retention or frequency Musculoskeletal:  No neck pain, back pain Integumentary: No rash, pruritus, skin lesions Neurological: as above Psychiatric: No depression, insomnia, anxiety Endocrine: No palpitations, fatigue, diaphoresis, mood swings, change in appetite, change in weight, increased thirst Hematologic/Lymphatic:  No purpura, petechiae. Allergic/Immunologic: no itchy/runny eyes, nasal congestion, recent allergic reactions, rashes  PHYSICAL EXAM: Vitals:   09/10/17 1434  BP: 120/86  Pulse: 88  SpO2: 100%   General: No acute distress.  Patient appears well-groomed.     IMPRESSION: Cerebral amyloid angiopathy Persistent atrial fibrillation Alcohol dependence BPPV  Although he would be at higher risk for a thrombotic event, Ideally he should discontinue anticoagulation and switch to ASA therapy.  Warfarin causes a 7 to 10-fold increase in frequency of ICH with 60% mortality.  ICH due to warfarin tend to be larger as well.  I did speak with Dr. Claiborne Billings and it was proposed to continue warfarin and keep INR goal at 2.  If anticoagulation is felt to be absolutely necessary, also consider switching to one of the direct oral anticoagulants (if there are no contraindications) which have a lower risk for ICH.  I have asked that Mr. Rochin and his daughter follow up with Dr. Claiborne Billings to discuss other potential treatment options for his atrial fibrillation (such as ablation).    As for the dizziness, vestibular rehab is really the best option.  He should try to discontinue  alcohol use.  He will follow up with me in 6 months.  27 minutes spent face to face with patient, 100% spent discussing MRI results, management and discussing risk and benefits of remaining on or discontinuing anticoagulation (ischemic vs. Hemorrhagic stroke).  Metta Clines, DO  CC:  Briscoe Deutscher, DO  Shelva Majestic, MD

## 2017-09-10 NOTE — Patient Instructions (Signed)
To reduce risk of bleeding in the brain, I would stop anticoagulation and instead use aspirin.  However, that would increase your risk for a stroke caused by a blood clot to the brain.  You are also at a higher risk for falls that may cause bleeding as well.  I would discuss with Dr. Claiborne Billings about other options to treat the atrial fibrillation.  If he feels anticoagulation is necessary, then keep INR at 2.  If this is a nonvalvular atrial fibrillation, may consider alternative anticoagulants such as Xarelto or Eliquis which have lower risk of bleeding in the brain.

## 2017-09-11 ENCOUNTER — Telehealth: Payer: Self-pay | Admitting: Family Medicine

## 2017-09-11 ENCOUNTER — Ambulatory Visit (INDEPENDENT_AMBULATORY_CARE_PROVIDER_SITE_OTHER): Payer: Medicare Other | Admitting: General Practice

## 2017-09-11 DIAGNOSIS — Z7901 Long term (current) use of anticoagulants: Secondary | ICD-10-CM | POA: Diagnosis not present

## 2017-09-11 LAB — POCT INR: INR: 1.6

## 2017-09-11 NOTE — Telephone Encounter (Signed)
MEDICATION: oxyCODONE-acetaminophen (ROXICET) 5-325 MG tablet  PHARMACY: [facility]   IS THIS A 90 DAY SUPPLY : 3 prescriptions  IS PATIENT OUT OF MEDICATION: no  IF NOT; HOW MUCH IS LEFT: "almost out"  LAST APPOINTMENT DATE: @10 /23/2018  NEXT APPOINTMENT DATE:@Visit  date not found  OTHER COMMENTS:    **Let patient know to contact pharmacy at the end of the day to make sure medication is ready. **  ** Please notify patient to allow 48-72 hours to process**  **Encourage patient to contact the pharmacy for refills or they can request refills through [facility]**

## 2017-09-11 NOTE — Patient Instructions (Signed)
Pre visit review using our clinic review tool, if applicable. No additional management support is needed unless otherwise documented below in the visit note. 

## 2017-09-11 NOTE — Progress Notes (Signed)
I agree with this plan.

## 2017-09-12 NOTE — Telephone Encounter (Signed)
Please advise on refill.

## 2017-09-13 NOTE — Telephone Encounter (Signed)
Scheduled patient to come in on Tuesday.

## 2017-09-13 NOTE — Telephone Encounter (Signed)
Needs visit

## 2017-09-17 ENCOUNTER — Other Ambulatory Visit: Payer: Self-pay | Admitting: Family Medicine

## 2017-09-17 ENCOUNTER — Ambulatory Visit (INDEPENDENT_AMBULATORY_CARE_PROVIDER_SITE_OTHER): Payer: Medicare Other | Admitting: Family Medicine

## 2017-09-17 VITALS — BP 110/72 | HR 71 | Temp 97.8°F | Ht 69.0 in | Wt 192.6 lb

## 2017-09-17 DIAGNOSIS — M545 Low back pain, unspecified: Secondary | ICD-10-CM

## 2017-09-17 DIAGNOSIS — M542 Cervicalgia: Secondary | ICD-10-CM

## 2017-09-17 DIAGNOSIS — G4730 Sleep apnea, unspecified: Secondary | ICD-10-CM | POA: Diagnosis not present

## 2017-09-17 DIAGNOSIS — G8929 Other chronic pain: Secondary | ICD-10-CM

## 2017-09-17 MED ORDER — OXYCODONE-ACETAMINOPHEN 5-325 MG PO TABS: 1.0000 | ORAL_TABLET | Freq: Two times a day (BID) | ORAL | 0 refills | Status: DC | PRN

## 2017-09-18 ENCOUNTER — Ambulatory Visit: Payer: Medicare Other | Admitting: General Practice

## 2017-09-18 DIAGNOSIS — I4891 Unspecified atrial fibrillation: Secondary | ICD-10-CM

## 2017-09-18 DIAGNOSIS — Z7901 Long term (current) use of anticoagulants: Secondary | ICD-10-CM

## 2017-09-18 LAB — POCT INR: INR: 2.7

## 2017-09-18 NOTE — Patient Instructions (Signed)
.  Pre visit review using our clinic review tool, if applicable. No additional management support is needed unless otherwise documented below in the visit note.  Please continue to take 1/2 tablet all days except 1 tablet on Monday and Fridays.  Re-check in 3 weeks.

## 2017-09-22 ENCOUNTER — Encounter: Payer: Self-pay | Admitting: Family Medicine

## 2017-09-22 NOTE — Progress Notes (Signed)
Marcus Galloway is a 76 y.o. male is here for follow up.  History of Present Illness:   HPI:  1. Sleep disorder breathing. Known snoring. With daytime somnolence. He has never had a sleep study completed.     2. Indication for chronic opioid: Chronic neck and low back pain Medication and dose: Oxycodone 5/325 tabs, 1 tablet BID  # pills per month: 60 Last UDS date: unknown Pain contract signed (Y/N): Y Date narcotic database last reviewed (include red flags): today, without red flags   Health Maintenance Due  Topic Date Due  . PNA vac Low Risk Adult (1 of 2 - PCV13) 05/23/2005   Depression screen [facility] 2/9 07/12/2017 01/14/2017  Decreased Interest 3 0  Down, Depressed, Hopeless 3 0  PHQ - 2 Score 6 0  Altered sleeping 0 -  Tired, decreased energy 3 -  Change in appetite 0 -  Feeling bad or failure about yourself  3 -  Trouble concentrating 3 -  Moving slowly or fidgety/restless 2 -  Suicidal thoughts 1 -  PHQ-9 Score 18 -  Difficult doing work/chores Extremely dIfficult -   PMHx, SurgHx, SocialHx, FamHx, Medications, and Allergies were reviewed in the Visit Navigator and updated as appropriate.   Patient Active Problem List   Diagnosis Date Noted  . Atrial fibrillation ([place]) [I48.91] 07/19/2017  . Long term (current) use of anticoagulants [Z79.01] 07/19/2017  . COPD (chronic obstructive pulmonary disease) ([place]) 05/18/2017  . Vertigo 05/16/2017  . Back pain 05/16/2017  . Shortness of breath 05/16/2017  . BPH with obstruction/lower urinary tract symptoms 01/15/2017  . Arthritis 01/14/2017  . GERD (gastroesophageal reflux disease) 01/14/2017  . Prostate cancer ([place]) 01/14/2017  . Persistent atrial fibrillation ([place]) 01/14/2017  . Rosacea 01/14/2017  . Chronic neck pain 01/14/2017  . Hyperlipidemia   . Coagulopathy ([place]) 10/18/2012  . S/P aortic valve replacement 10/17/2012  . ETOH abuse 10/16/2012  . CHF NYHA class II ([place]) 10/14/2012   Social History   Tobacco  Use  . Smoking status: Former Research scientist (life sciences)  . Smokeless tobacco: Never Used  Substance Use Topics  . Alcohol use: Yes    Comment: 2-3 drinks daily at night  . Drug use: No   Current Medications and Allergies:   .  atorvastatin (LIPITOR) 10 MG tablet, Take 1 tablet (10 mg total) by mouth daily., Disp: 30 tablet, Rfl: 4 .  CARTIA XT 180 MG 24 hr capsule, TAKE ONE CAPSULE BY MOUTH DAILY, Disp: 30 capsule, Rfl: 3 .  doxycycline (ADOXA) 50 MG tablet, Take 1 tablet (50 mg total) by mouth daily. (Patient taking differently: Take 25 mg by mouth daily. ), Disp: 30 tablet, Rfl: 3 .  metoprolol tartrate (LOPRESSOR) 50 MG tablet, TAKE 1 TABLET BY MOUTH 2 TIMES A DAY, Disp: 180 tablet, Rfl: 3 .  OMEPRAZOLE PO, Take 20 mg by mouth daily., Disp: , Rfl:  .  oxyCODONE-acetaminophen (ROXICET) 5-325 MG tablet, Take 1 tablet by mouth 2 (two) times daily as needed for severe pain., Disp: 60 tablet, Rfl: 0 .  tamsulosin (FLOMAX) 0.4 MG CAPS capsule, TAKE ONE CAPSULE BY MOUTH DAILY, Disp: 30 capsule, Rfl: 2 .  warfarin (COUMADIN) 5 MG tablet, Take 5 mg by mouth daily., Disp: , Rfl:   No Known Allergies   Review of Systems   Pertinent items are noted in the HPI. Otherwise, ROS is negative.  Vitals:   Vitals:   09/17/17 1339  BP: 110/72  Pulse: 71  Temp: 97.8 F (  36.6 C)  TempSrc: Oral  SpO2: 98%  Weight: 192 lb 9.6 oz (87.4 kg)  Height: 5\' 9"  (1.753 m)     Body mass index is 28.44 kg/m.   Physical Exam:   Physical Exam  Constitutional: He is oriented to person, place, and time. He appears well-developed and well-nourished. No distress.  HENT:  Head: Normocephalic and atraumatic.  Right Ear: External ear normal.  Left Ear: External ear normal.  Nose: Nose normal.  Mouth/Throat: Oropharynx is clear and moist.  Eyes: Conjunctivae and EOM are normal. Pupils are equal, round, and reactive to light.  Neck: Normal range of motion. Neck supple.  Cardiovascular: Normal rate, regular rhythm, normal  heart sounds and intact distal pulses.  Pulmonary/Chest: Effort normal and breath sounds normal.  Abdominal: Soft. Bowel sounds are normal.  Musculoskeletal: Normal range of motion.  Neurological: He is alert and oriented to person, place, and time.  Skin: Skin is warm and dry.  Psychiatric: He has a normal mood and affect. His behavior is normal. Judgment and thought content normal.  Nursing note and vitals reviewed.   Assessment and Plan:   Elpidio was seen today for medication refill.  Diagnoses and all orders for this visit:  Sleep disorder breathing -     Home sleep test; Future  Chronic neck pain Chronic midline low back pain without sciatica Comments: We had a long discussion about chronic pain medications, the risks, benefits, potential side effects of medication.  We reviewed him pain contract in detail.  The patient understands the contract and is willing to abide by it.  He has signed this and we will scan it into his record. He understands that he must take the medication as prescribed, he cannot obtain narcotic medication from any other source, he is not to request refills early, he is not to escalate the dose without a discussion.    He understands that narcotic prescriptions cannot be called in, mailed, or faxed. He will need to pick them up in person.  We reviewed that I prescribed using a 28-day schedule to ensure there is no confusion about weekend refills.  He understands that he will need to call a week in advance when he needs medication refills.   For now, I have given him 3 prescription(s) and will see him again in 3 month (s).    We discussed the need for urine drug screens when they are requested. We discussed the need for a minimum of every 26-month visits.  We discussed the importance of securing his medication.  I reviewed that I will not refill medications early for loss of medication.  We discussed that there is a database that I reviewed to ensure that he  is not obtaining narcotic medication from other providers and that all narcotics must be prescribed by a single provider.  We discussed that if he is not able to abide by the pain contract, I will no longer be able to prescribe narcotics for him.  We discussed the dose of medication.  We decided on a maximum of 2 per day.  Orders: -     oxyCODONE-acetaminophen (ROXICET) 5-325 MG tablet; Take 1 tablet by mouth 2 (two) times daily as needed for severe pain. -     oxyCODONE-acetaminophen (ROXICET) 5-325 MG tablet; Take 1 tablet by mouth 2 (two) times daily as needed for severe pain. -     oxyCODONE-acetaminophen (ROXICET) 5-325 MG tablet; Take 1 tablet by mouth 2 (two) times daily as needed  for severe pain.  . Reviewed expectations re: course of current medical issues. . Discussed self-management of symptoms. . Outlined signs and symptoms indicating need for more acute intervention. . Patient verbalized understanding and all questions were answered. Marland Kitchen Health Maintenance issues including appropriate healthy diet, exercise, and smoking avoidance were discussed with patient. . See orders for this visit as documented in the electronic medical record. . Patient received an After Visit Summary.  Briscoe Deutscher, DO [place], [place] 09/22/2017  Future Appointments  Date Time Provider [place]  10/07/2017  2:00 PM [facility] None  03/10/2018  2:30 PM Metta Clines R, DO [facility] None  07/14/2018 10:00 AM Williemae Area, RN [facility] None  07/24/2018  9:15 AM Briscoe Deutscher, DO [facility] None

## 2017-09-30 ENCOUNTER — Telehealth: Payer: Self-pay | Admitting: Family Medicine

## 2017-09-30 NOTE — Telephone Encounter (Signed)
Please see CRM note below. Please advise.  Copied from [place] [phone]. Topic: General - Other >> Sep 30, 2017 12:37 PM Valla Leaver wrote: Reason for CRM: Juliann Pulse, w/ [facility] calling regarding home sleep study. Needs to know if it was completed but unable to get in contact with the patient.

## 2017-09-30 NOTE — Telephone Encounter (Signed)
Please help me with this. 

## 2017-09-30 NOTE — Telephone Encounter (Signed)
Patient has not had test yet. It was just ordered on 09/17/17. I tried to call the facility and could not get anyone on the phone.

## 2017-10-06 ENCOUNTER — Other Ambulatory Visit: Payer: Self-pay | Admitting: Family Medicine

## 2017-10-07 ENCOUNTER — Ambulatory Visit: Payer: Medicare Other

## 2017-10-09 ENCOUNTER — Ambulatory Visit (INDEPENDENT_AMBULATORY_CARE_PROVIDER_SITE_OTHER): Payer: Medicare Other | Admitting: Family Medicine

## 2017-10-09 DIAGNOSIS — Z7901 Long term (current) use of anticoagulants: Secondary | ICD-10-CM | POA: Diagnosis not present

## 2017-10-09 LAB — POCT INR: INR: 2.6

## 2017-10-09 NOTE — Patient Instructions (Signed)
Continue to take 1/2 tablet daily except 1 tablet on Monday and Fridays.

## 2017-10-10 NOTE — Progress Notes (Signed)
I have reviewed this visit and I agree on the patient's plan of dosage and recommendations. Thana Ramp, DO   

## 2017-10-13 ENCOUNTER — Other Ambulatory Visit: Payer: Self-pay | Admitting: Family Medicine

## 2017-10-14 ENCOUNTER — Telehealth: Payer: Self-pay

## 2017-10-14 NOTE — Telephone Encounter (Signed)
Received fax that [facility] is unable to reach patient to make app for sleep study.

## 2017-11-01 ENCOUNTER — Other Ambulatory Visit: Payer: Self-pay | Admitting: Surgical

## 2017-11-01 MED ORDER — ATORVASTATIN CALCIUM 10 MG PO TABS: 10.0000 mg | ORAL_TABLET | Freq: Every day | ORAL | 1 refills | Status: DC

## 2017-11-04 ENCOUNTER — Other Ambulatory Visit: Payer: Self-pay

## 2017-11-04 MED ORDER — TAMSULOSIN HCL 0.4 MG PO CAPS: 0.4000 mg | ORAL_CAPSULE | Freq: Every day | ORAL | 0 refills | Status: DC

## 2017-11-04 MED ORDER — ATORVASTATIN CALCIUM 10 MG PO TABS: 10.0000 mg | ORAL_TABLET | Freq: Every day | ORAL | 1 refills | Status: DC

## 2017-11-05 DIAGNOSIS — H43811 Vitreous degeneration, right eye: Secondary | ICD-10-CM | POA: Diagnosis not present

## 2017-11-06 ENCOUNTER — Ambulatory Visit (INDEPENDENT_AMBULATORY_CARE_PROVIDER_SITE_OTHER): Payer: Medicare Other | Admitting: General Practice

## 2017-11-06 DIAGNOSIS — Z7901 Long term (current) use of anticoagulants: Secondary | ICD-10-CM | POA: Diagnosis not present

## 2017-11-06 LAB — POCT INR: INR: 2

## 2017-11-06 NOTE — Patient Instructions (Addendum)
Take 1 tablet today and tomorrow (1/9) and then continue to take 1/2 tablet all days except 1 tablet on Monday and Fridays.  Re-check in 3 to 4 weeks.

## 2017-12-04 ENCOUNTER — Ambulatory Visit (INDEPENDENT_AMBULATORY_CARE_PROVIDER_SITE_OTHER): Payer: Medicare Other | Admitting: General Practice

## 2017-12-04 DIAGNOSIS — I4891 Unspecified atrial fibrillation: Secondary | ICD-10-CM

## 2017-12-04 DIAGNOSIS — Z7901 Long term (current) use of anticoagulants: Secondary | ICD-10-CM | POA: Diagnosis not present

## 2017-12-04 LAB — POCT INR: INR: 1.1

## 2017-12-04 NOTE — Patient Instructions (Signed)
Start taking 1 (5 mg) daily and re-check in 1 week.

## 2017-12-04 NOTE — Progress Notes (Signed)
I have reviewed this visit and I agree on the patient's plan of dosage and recommendations. Ovetta Bazzano, DO   

## 2017-12-08 ENCOUNTER — Other Ambulatory Visit: Payer: Self-pay | Admitting: Cardiovascular Disease

## 2017-12-09 NOTE — Telephone Encounter (Signed)
REFILL 

## 2017-12-11 ENCOUNTER — Ambulatory Visit (INDEPENDENT_AMBULATORY_CARE_PROVIDER_SITE_OTHER): Payer: Medicare Other | Admitting: General Practice

## 2017-12-11 DIAGNOSIS — Z7901 Long term (current) use of anticoagulants: Secondary | ICD-10-CM

## 2017-12-11 DIAGNOSIS — I4891 Unspecified atrial fibrillation: Secondary | ICD-10-CM | POA: Diagnosis not present

## 2017-12-11 LAB — POCT INR: INR: 1.8

## 2017-12-11 NOTE — Patient Instructions (Signed)
Take 1 1/2 tablets today and tomorrow (2/13 and 2/14) and then take 1 tablet daily and re-check in 2 weeks.

## 2017-12-11 NOTE — Progress Notes (Signed)
I have reviewed this visit and I agree on the patient's plan of dosage and recommendations. Kaysan Peixoto, DO   

## 2017-12-25 ENCOUNTER — Ambulatory Visit (INDEPENDENT_AMBULATORY_CARE_PROVIDER_SITE_OTHER): Payer: Medicare Other | Admitting: General Practice

## 2017-12-25 DIAGNOSIS — Z7901 Long term (current) use of anticoagulants: Secondary | ICD-10-CM | POA: Diagnosis not present

## 2017-12-25 DIAGNOSIS — I4891 Unspecified atrial fibrillation: Secondary | ICD-10-CM

## 2017-12-25 LAB — POCT INR: INR: 2.3

## 2017-12-25 NOTE — Progress Notes (Signed)
I have reviewed this visit and I agree on the patient's plan of dosage and recommendations. Latorya Bautch, DO   

## 2017-12-25 NOTE — Patient Instructions (Addendum)
Pre visit review using our clinic review tool, if applicable. No additional management support is needed unless otherwise documented below in the visit note.   Take extra 1/2 tablet today and then continue to take 1 (5 mg) tablet daily.  Re-check in 4 weeks.

## 2017-12-27 ENCOUNTER — Encounter: Payer: Self-pay | Admitting: Family Medicine

## 2017-12-27 ENCOUNTER — Ambulatory Visit (INDEPENDENT_AMBULATORY_CARE_PROVIDER_SITE_OTHER): Payer: Medicare Other | Admitting: Family Medicine

## 2017-12-27 VITALS — BP 118/68 | HR 97 | Temp 98.6°F | Wt 189.4 lb

## 2017-12-27 DIAGNOSIS — G3281 Cerebellar ataxia in diseases classified elsewhere: Secondary | ICD-10-CM | POA: Diagnosis not present

## 2017-12-27 DIAGNOSIS — I481 Persistent atrial fibrillation: Secondary | ICD-10-CM

## 2017-12-27 DIAGNOSIS — M2550 Pain in unspecified joint: Secondary | ICD-10-CM

## 2017-12-27 DIAGNOSIS — J439 Emphysema, unspecified: Secondary | ICD-10-CM | POA: Diagnosis not present

## 2017-12-27 DIAGNOSIS — I10 Essential (primary) hypertension: Secondary | ICD-10-CM | POA: Diagnosis not present

## 2017-12-27 DIAGNOSIS — Z87891 Personal history of nicotine dependence: Secondary | ICD-10-CM | POA: Diagnosis not present

## 2017-12-27 DIAGNOSIS — Z8612 Personal history of poliomyelitis: Secondary | ICD-10-CM

## 2017-12-27 DIAGNOSIS — I509 Heart failure, unspecified: Secondary | ICD-10-CM | POA: Diagnosis not present

## 2017-12-27 DIAGNOSIS — D689 Coagulation defect, unspecified: Secondary | ICD-10-CM

## 2017-12-27 DIAGNOSIS — E781 Pure hyperglyceridemia: Secondary | ICD-10-CM | POA: Diagnosis not present

## 2017-12-27 DIAGNOSIS — I272 Pulmonary hypertension, unspecified: Secondary | ICD-10-CM

## 2017-12-27 DIAGNOSIS — F1020 Alcohol dependence, uncomplicated: Secondary | ICD-10-CM

## 2017-12-27 DIAGNOSIS — I4819 Other persistent atrial fibrillation: Secondary | ICD-10-CM

## 2017-12-27 DIAGNOSIS — C61 Malignant neoplasm of prostate: Secondary | ICD-10-CM | POA: Diagnosis not present

## 2017-12-27 MED ORDER — OXYCODONE-ACETAMINOPHEN 5-325 MG PO TABS: 1.0000 | ORAL_TABLET | Freq: Three times a day (TID) | ORAL | 0 refills | Status: DC

## 2017-12-27 NOTE — Progress Notes (Signed)
Marcus Galloway is a 77 y.o. male is here for follow up.  History of Present Illness:   Marcus Galloway, CMA acting as scribe for Dr. Briscoe Deutscher.    HPI:   Indication for chronic opioid: Chronic neck and low back pain Medication and dose: Oxycodone 5/325 tabs, 1 tablet BID  # pills per month: 60 Last UDS date: unknown Pain contract signed (Y/N): Y Date narcotic database last reviewed (include red flags): at last visit, without red flags  Review of Systems  Constitutional: Negative for chills and fever.  HENT: Negative for hearing loss and nosebleeds.   Eyes: Negative for blurred vision and double vision.  Respiratory: Negative for cough.   Cardiovascular: Negative for chest pain and palpitations.  Gastrointestinal: Negative for nausea and vomiting.  Genitourinary: Negative for dysuria and urgency.  Musculoskeletal: Negative for myalgias.  Neurological: Negative for focal weakness and headaches.    Health Maintenance Due  Topic Date Due  . PNA vac Low Risk Adult (1 of 2 - PCV13) 05/23/2005   Depression screen [facility] 2/9 12/27/2017 07/12/2017 01/14/2017  Decreased Interest 0 3 0  Down, Depressed, Hopeless 0 3 0  PHQ - 2 Score 0 6 0  Altered sleeping 0 0 -  Tired, decreased energy 1 3 -  Change in appetite 0 0 -  Feeling bad or failure about yourself  0 3 -  Trouble concentrating 0 3 -  Moving slowly or fidgety/restless 0 2 -  Suicidal thoughts 0 1 -  PHQ-9 Score 1 18 -  Difficult doing work/chores - Extremely dIfficult -   PMHx, SurgHx, SocialHx, FamHx, Medications, and Allergies were reviewed in the Visit Navigator and updated as appropriate.   Patient Active Problem List   Diagnosis Date Noted  . Cerebellar ataxia in diseases classified elsewhere [facility]), refuses Neurology PT 12/28/2017  . Uncomplicated alcohol dependence ([place]), cessation 10/2017 12/28/2017  . Former smoker, stopped smoking in distant past, quit 1980 12/28/2017  . 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 Hypertension 12/28/2017  . Mild pulmonary hypertension ([place]), last ECHO 2018 12/28/2017  . Long term (current) use of anticoagulants [Z79.01], for persistent atrial fibrillation and valve replacement 07/19/2017  . 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 Degenerative arthritis of lumbar spine 05/16/2017  . BPH with obstruction/lower urinary tract symptoms, on Flomax 01/15/2017  . Arthralgia of multiple joints, controlled with 1.5 Percocet daily 01/14/2017  . GERD (gastroesophageal reflux disease), on Omeprazole 01/14/2017  . Prostate cancer ([place]) 01/14/2017  . Persistent atrial fibrillation ([place]), on Coumadin 01/14/2017  . Rosacea, controlled with daily Doxycycline 01/14/2017  . Degenerative arthritis of cervical spine 01/14/2017  . Hyperlipidemia, on Lipitor   . S/P aortic valve replacement 10/17/2012  . CHF, NYHA class II ([place]), EF 60-65% 10/14/2012   Social History   Tobacco Use  . Smoking status: Former Research scientist (life sciences)  . Smokeless tobacco: Never Used  Substance Use Topics  . Alcohol use: Yes    Comment: 2-3 drinks daily at night  . Drug use: No   Current Medications and Allergies:   .  atorvastatin (LIPITOR) 10 MG tablet, Take 1 tablet (10 mg total) by mouth daily., Disp: 90 tablet, Rfl: 1 .  diltiazem (CARTIA XT) 180 MG 24 hr capsule, Take 1 capsule (180 mg total) by mouth daily. NEED OV., Disp: 30 capsule, Rfl: 2 .  doxycycline (ADOXA) 50 MG tablet, TAKE ONE TABLET BY MOUTH DAILY, Disp: 30 tablet, Rfl: 2 .  metoprolol  tartrate (LOPRESSOR) 50 MG tablet, TAKE 1 TABLET BY MOUTH 2 TIMES A DAY, Disp: 180 tablet, Rfl: 3 .  OMEPRAZOLE PO, Take 20 mg by mouth daily., Disp: , Rfl:  .  oxyCODONE-acetaminophen (ROXICET) 5-325 MG tablet, Take 1 tablet by mouth 2 (two) times daily as needed for severe pain., Disp: 60 tablet, Rfl: 0 .  tamsulosin (FLOMAX) 0.4 MG CAPS capsule, Take 1 capsule (0.4 mg total) by mouth daily., Disp: 90  capsule, Rfl: 0 .  warfarin (COUMADIN) 5 MG tablet, Take 5 mg by mouth daily., Disp: , Rfl:   No Known Allergies   Review of Systems   Pertinent items are noted in the HPI. Otherwise, ROS is negative.  Vitals:   Vitals:   12/27/17 0949  BP: 118/68  Pulse: 97  Temp: 98.6 F (37 C)  TempSrc: Oral  SpO2: 97%  Weight: 189 lb 6.4 oz (85.9 kg)     Body mass index is 27.97 kg/m.  Physical Exam:   Physical Exam  Constitutional: He is oriented to person, place, and time. He appears well-developed and well-nourished. No distress.  HENT:  Head: Normocephalic and atraumatic.  Right Ear: External ear normal.  Left Ear: External ear normal.  Nose: Nose normal.  Mouth/Throat: Oropharynx is clear and moist.  Eyes: Conjunctivae and EOM are normal. Pupils are equal, round, and reactive to light.  Neck: Normal range of motion. Neck supple.  Cardiovascular: Normal rate and intact distal pulses. An irregularly irregular rhythm present.  Murmur heard. Pulmonary/Chest: Effort normal and breath sounds normal.  Abdominal: Soft. Bowel sounds are normal.  Musculoskeletal: Normal range of motion.  Neurological: He is alert and oriented to person, place, and time.  Skin: Skin is warm and dry.  Psychiatric: He has a normal mood and affect. His behavior is normal. Judgment and thought content normal.  Nursing note and vitals reviewed.   Results for orders placed or performed in visit on 12/25/17  POCT INR  Result Value Ref Range   INR 2.3    Assessment and Plan:    Problem  Cerebellar ataxia in diseases classified elsewhere [facility]), refuses Neurology PT   MRI BRAIN (09/05/17)  IMPRESSION: 1. No acute intracranial abnormality. 2. Multiple (>10) chronic micro-hemorrhages in the brain, although more numerous in the cerebral hemispheres than the cerebellum. 3. No cerebellar encephalomalacia identified, but there are small chronic lacunar infarcts in the left caudate nucleus, and  moderate for age nonspecific cerebral white matter signal changes suggesting chronic small vessel disease.   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 The cardiac silhouette is normal in size and configuration. No mediastinal or hilar masses. No evidence of adenopathy.  Mild linear opacities noted at the lung bases consistent with scarring or subsegmental atelectasis. Lungs are hyperexpanded but otherwise clear.  No pleural effusion or pneumothorax.  Skeletal structures are demineralized. There is slight wedge-shaped deformities at the thoracolumbar junction, most likely chronic.  IMPRESSION: 1. No acute cardiopulmonary disease. 2. Status post cardiac surgery and aortic valve replacement. 3. Hyperexpanded lungs suggests COPD.   Degenerative Arthritis of Lumbar Spine  BPH  with obstruction/lower urinary tract symptoms, on Flomax  Arthralgia of multiple joints, controlled with 1.5 Percocet daily   Indication for chronic opioid: CHRONIC ARTHRALGIA of hands, low back, and neck. Unable to take NSAIDs due to Coumadin. Medication and dose: Oxycodone 5/325 tabs, 1 tablet BID (sometimes 1/2 tab in afternoon), tolerating well. # pills per month: 90 (written as TID dosing). Last UDS date: unknown. Pain contract signed (Y/N): Y. Date narcotic database last reviewed (include red flags): at last visit, without red flags.   GERD (gastroesophageal reflux disease), on Omeprazole  Persistent atrial fibrillation ([place]), on Coumadin   Rosacea, controlled with daily Doxycycline  Degenerative Arthritis of Cervical Spine  Hyperlipidemia, on Lipitor   Lab Results  Component Value Date   CHOL 151 02/26/2017   HDL 57 02/26/2017   LDLCALC 68 02/26/2017   TRIG 130 02/26/2017   CHOLHDL 2.6 02/26/2017      CHF, NYHA class II ([place]), EF 60-65%   2018 ECHO  Study Conclusions  - Left ventricle: The cavity size was normal. Wall thickness was   increased in a pattern of mild LVH. Systolic function was normal.   The estimated ejection fraction was in the range of 60% to 65%.   Wall motion was normal; there were no regional wall motion   abnormalities. The study is not technically sufficient to allow   evaluation of LV diastolic function. - Aortic valve: Bioprosthetic AVR. No obstruction. - Mitral valve: Calcified annulus. Mildly thickened leaflets .   There was trivial regurgitation. - Left atrium: Severely dilated. - Right atrium: Severely dilated. - Tricuspid valve: There was mild regurgitation. - Pulmonary arteries: PA peak pressure: 36 mm Hg (S). - Inferior vena cava: The vessel was dilated. The respirophasic   diameter changes were blunted (< 50%), consistent with elevated   central venous pressure.  Impressions:  - LVEF 60-65%<, mild LVH, normal wall motion, bioprosthetic AVR   without obstruction, trivial MR, severe biatrial enlargement,   mild TR, RVSP 36 mmHg, dilated IVC.    No orders of the defined types were placed in this encounter.  Meds ordered this encounter  Medications  . DISCONTD: oxyCODONE-acetaminophen (ROXICET) 5-325 MG tablet    Sig: Take 1 tablet by mouth 3 (three) times daily. May fill now.    Dispense:  90 tablet    Refill:  0  . DISCONTD: oxyCODONE-acetaminophen (ROXICET) 5-325 MG tablet    Sig: Take 1 tablet by mouth 3 (three) times daily. May fill 01/27/2018    Dispense:  90 tablet    Refill:  0  . oxyCODONE-acetaminophen (ROXICET) 5-325 MG tablet    Sig: Take 1 tablet by mouth  3 (three) times daily. May fill 02/26/2018    Dispense:  90 tablet    Refill:  0    . Reviewed expectations re: course of current medical issues. . Discussed self-management of symptoms. . Outlined signs and symptoms indicating need for more acute intervention. . Patient verbalized understanding and all questions were answered. Marland Kitchen Health Maintenance issues including appropriate healthy diet, exercise, and smoking avoidance were discussed with patient. . See orders for this visit as documented in the electronic medical record. . Patient received an After Visit Summary.  CMA served as Education administrator during this visit. History, Physical, and Plan performed by medical provider. The above documentation has been reviewed and is accurate and complete. Briscoe Deutscher, D.O.  Briscoe Deutscher, DO [place], [facility] 12/28/2017

## 2017-12-28 DIAGNOSIS — Z87891 Personal history of nicotine dependence: Secondary | ICD-10-CM | POA: Insufficient documentation

## 2017-12-28 DIAGNOSIS — I272 Pulmonary hypertension, unspecified: Secondary | ICD-10-CM | POA: Insufficient documentation

## 2017-12-28 DIAGNOSIS — Z8612 Personal history of poliomyelitis: Secondary | ICD-10-CM | POA: Insufficient documentation

## 2017-12-28 DIAGNOSIS — G3281 Cerebellar ataxia in diseases classified elsewhere: Secondary | ICD-10-CM | POA: Insufficient documentation

## 2017-12-28 DIAGNOSIS — I10 Essential (primary) hypertension: Secondary | ICD-10-CM | POA: Insufficient documentation

## 2017-12-28 DIAGNOSIS — F1020 Alcohol dependence, uncomplicated: Secondary | ICD-10-CM | POA: Insufficient documentation

## 2018-01-15 ENCOUNTER — Other Ambulatory Visit: Payer: Self-pay | Admitting: Family Medicine

## 2018-01-29 ENCOUNTER — Other Ambulatory Visit: Payer: Self-pay | Admitting: General Practice

## 2018-01-29 ENCOUNTER — Ambulatory Visit (INDEPENDENT_AMBULATORY_CARE_PROVIDER_SITE_OTHER): Payer: Medicare Other | Admitting: General Practice

## 2018-01-29 DIAGNOSIS — Z7901 Long term (current) use of anticoagulants: Secondary | ICD-10-CM

## 2018-01-29 LAB — POCT INR: INR: 1.8

## 2018-01-29 MED ORDER — METOPROLOL TARTRATE 50 MG PO TABS: ORAL_TABLET | ORAL | 1 refills | Status: DC

## 2018-01-29 MED ORDER — WARFARIN SODIUM 5 MG PO TABS: ORAL_TABLET | ORAL | 1 refills | Status: DC

## 2018-01-29 NOTE — Patient Instructions (Addendum)
Pre visit review using our clinic review tool, if applicable. No additional management support is needed unless otherwise documented below in the visit note. Take extra 1/2 tablet today and then continue to take 1 tablet daily.  Re-check in 3 to 4 weeks.

## 2018-01-29 NOTE — Progress Notes (Signed)
I have reviewed this visit and I agree on the patient's plan of dosage and recommendations. Kambryn Dapolito, DO   

## 2018-02-02 ENCOUNTER — Other Ambulatory Visit: Payer: Self-pay | Admitting: Family Medicine

## 2018-02-19 ENCOUNTER — Ambulatory Visit (INDEPENDENT_AMBULATORY_CARE_PROVIDER_SITE_OTHER): Payer: Medicare Other | Admitting: General Practice

## 2018-02-19 DIAGNOSIS — Z7901 Long term (current) use of anticoagulants: Secondary | ICD-10-CM | POA: Diagnosis not present

## 2018-02-19 LAB — POCT INR: INR: 2.7

## 2018-02-19 NOTE — Patient Instructions (Addendum)
Pre visit review using our clinic review tool, if applicable. No additional management support is needed unless otherwise documented below in the visit note.  Continue to take 1 tablet daily.  Re-check in 4 weeks.

## 2018-02-20 NOTE — Progress Notes (Signed)
I have reviewed this visit and I agree on the patient's plan of dosage and recommendations. Camylle Whicker, DO   

## 2018-03-07 ENCOUNTER — Other Ambulatory Visit: Payer: Self-pay | Admitting: Cardiovascular Disease

## 2018-03-07 NOTE — Telephone Encounter (Signed)
Rx(s) sent to pharmacy electronically.  

## 2018-03-10 ENCOUNTER — Encounter: Payer: Self-pay | Admitting: Neurology

## 2018-03-10 ENCOUNTER — Ambulatory Visit: Payer: Medicare Other | Admitting: Neurology

## 2018-03-10 VITALS — BP 110/78 | HR 57 | Ht 69.0 in | Wt 186.0 lb

## 2018-03-10 DIAGNOSIS — G3184 Mild cognitive impairment, so stated: Secondary | ICD-10-CM

## 2018-03-10 DIAGNOSIS — I680 Cerebral amyloid angiopathy: Secondary | ICD-10-CM | POA: Diagnosis not present

## 2018-03-10 NOTE — Progress Notes (Signed)
NEUROLOGY FOLLOW UP OFFICE NOTE  Marcus Galloway [id number]  HISTORY OF PRESENT ILLNESS: Marcus Galloway is a 77 year old right handed male with persistent atrial fibrillation, CHF, chronic neck and back pain and history of polio who follows up for dizziness and cerebral amyloid angiopathy.  He is accompanied by his daughter who supplements history.   UPDATE: He quit drinking 5 months ago.  He is independent but finds it more difficult to perform certain tasks such as paying bills.  He has not made any mistakes.  He takes less Percocet now as well.   HISTORY: He has history of dizziness and unsteady gait for about 7 years.  He reports a constant dizziness described as a spinning sensation that fluctuates and is worse with standing up or bending over.  It may last a couple of minutes.  There is no associated double vision, slurred speech, trouble swallowing, facial numbness or hearing loss.  He notes history of tinnitus.  Sometimes he may feel nauseous.  Nothing really makes it better except for keeping still.  He has undergone vestibular rehabilitation which was initially helpful but then was ultimately ineffective.  Over the past few years, he reports increased difficulty with concentration and performing ADLs such as paying bills.  He reports short-term memory deficits.  MRI of brain from 09/05/17 was personally reviewed and revealed chronic small vessel disease but also chronic microhemorrhages in the cerebral white matter, in pattern consistent with cerebral amyloid angiopathy.  There are very few microhemorrhages in the cerebellum as well.    He takes Percocet for chronic neck and back pain.  He drinks bourbon/3 to 4 cocktails daily.  He had polio as a child.  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  PAST MEDICAL HISTORY: Past Medical History:  Diagnosis Date  . Aortic valve disease   . Atrial fibrillation ([place])   . BPH  (benign prostatic hyperplasia)   . Chronic neck pain   . GERD (gastroesophageal reflux disease)   . History of colonic polyps   . Hyperlipidemia   . Hypertension   . Osteoarthritis   . Persistent atrial fibrillation ([place])   . Polio   . Prostate cancer [facility])     MEDICATIONS: Current Outpatient Medications on File Prior to Visit  Medication Sig Dispense Refill  . atorvastatin (LIPITOR) 10 MG tablet Take 1 tablet (10 mg total) by mouth daily. 90 tablet 1  . diltiazem (CARTIA XT) 180 MG 24 hr capsule Take 1 capsule (180 mg total) by mouth daily. <PLEASE MAKE APPOINTMENT FOR REFILLS> 30 capsule 0  . metoprolol tartrate (LOPRESSOR) 50 MG tablet TAKE 1 TABLET BY MOUTH 2 TIMES A DAY 180 tablet 1  . OMEPRAZOLE PO Take 20 mg by mouth daily.    Marland Kitchen oxyCODONE-acetaminophen (ROXICET) 5-325 MG tablet Take 1 tablet by mouth 3 (three) times daily. May fill 02/26/2018 90 tablet 0  . tamsulosin (FLOMAX) 0.4 MG CAPS capsule TAKE ONE CAPSULE BY MOUTH DAILY 90 capsule 1  . warfarin (COUMADIN) 5 MG tablet Take 1 tablet daily  Or AS DIRECTED BY ANTICOAGULATION CLINIC. 100 tablet 1   No current facility-administered medications on file prior to visit.     ALLERGIES: No Known Allergies  FAMILY HISTORY: Family History  Adopted: Yes  Family history unknown: Yes    SOCIAL HISTORY: Social History   Socioeconomic History  . Marital status: Widowed    Spouse name: Not on file  . Number  of children: 4  . Years of education: 64  . Highest education level: Not on file  Occupational History  . Occupation: Retired  Scientific laboratory technician  . Financial resource strain: Not on file  . Food insecurity:    Worry: Not on file    Inability: Not on file  . Transportation needs:    Medical: Not on file    Non-medical: Not on file  Tobacco Use  . Smoking status: Former Research scientist (life sciences)  . Smokeless tobacco: Never Used  Substance and Sexual Activity  . Alcohol use: Yes    Comment: 2-3 drinks daily at night  . Drug use: No  .  Sexual activity: Not Currently  Lifestyle  . Physical activity:    Days per week: Not on file    Minutes per session: Not on file  . Stress: Not on file  Relationships  . Social connections:    Talks on phone: Not on file    Gets together: Not on file    Attends religious service: Not on file    Active member of club or organization: Not on file    Attends meetings of clubs or organizations: Not on file    Relationship status: Not on file  . Intimate partner violence:    Fear of current or ex partner: Not on file    Emotionally abused: Not on file    Physically abused: Not on file    Forced sexual activity: Not on file  Other Topics Concern  . Not on file  Social History Narrative   Lives alone, recently moved back to [place] from [facility] to be closer to his children   Right-handed   Caffeine: 3 cups each morning    REVIEW OF SYSTEMS: Constitutional: No fevers, chills, or sweats, no generalized fatigue, change in appetite Eyes: No visual changes, double vision, eye pain Ear, nose and throat: No hearing loss, ear pain, nasal congestion, sore throat Cardiovascular: No chest pain, palpitations Respiratory:  No shortness of breath at rest or with exertion, wheezes GastrointestinaI: No nausea, vomiting, diarrhea, abdominal pain, fecal incontinence Genitourinary:  No dysuria, urinary retention or frequency Musculoskeletal:  No neck pain, back pain Integumentary: No rash, pruritus, skin lesions Neurological: as above Psychiatric: No depression, insomnia, anxiety Endocrine: No palpitations, fatigue, diaphoresis, mood swings, change in appetite, change in weight, increased thirst Hematologic/Lymphatic:  No purpura, petechiae. Allergic/Immunologic: no itchy/runny eyes, nasal congestion, recent allergic reactions, rashes  PHYSICAL EXAM: Vitals:   03/10/18 1425  BP: 110/78  Pulse: (!) 57  SpO2: 98%   General: No acute distress.  Patient appears well-groomed.  normal body habitus. Head:   Normocephalic/atraumatic Eyes:  Fundi examined but not visualized Neck: supple, no paraspinal tenderness, full range of motion Heart:  Regular rate and rhythm Lungs:  Clear to auscultation bilaterally Back: No paraspinal tenderness Neurological Exam: alert and oriented to person, place, and time. Attention span and concentration decreased, recent memory poor, remote memory intact, fund of knowledge intact.  Speech fluent and not dysarthric, language intact.  Able to complete Trail Making Test, copy cube and draw a clock, except clock hands drawn to incorrect time. Montreal Cognitive Assessment  03/10/2018  Visuospatial/ Executive (0/5) 4  Naming (0/3) 3  Attention: Read list of digits (0/2) 2  Attention: Read list of letters (0/1) 1  Attention: Serial 7 subtraction starting at 100 (0/3) 1  Language: Repeat phrase (0/2) 1  Language : Fluency (0/1) 0  Abstraction (0/2) 1  Delayed Recall (0/5) 0  Orientation (0/6) 6  Total 19  Adjusted Score (based on education) 20   CN II-XII intact. Bulk and tone normal, muscle strength 5/5 throughout.  Sensation to light touch  intact.  Deep tendon reflexes 2+ throughout.  Finger to nose testing intact.  Gait normal  IMPRESSION: Cerebral amyloid angiopathy Cognitive impairment, likely secondary to alcoholism or/and cerebral amyloid angiopathy  PLAN: Monitor for now Follow up in one year  26 minutes spent face to face with patient, over 50% spent discussing management and diagnosis.   Metta Clines, DO  CC: Dr. [name]

## 2018-03-10 NOTE — Patient Instructions (Signed)
Monitor memory Follow up in one year or as needed

## 2018-03-19 ENCOUNTER — Ambulatory Visit (INDEPENDENT_AMBULATORY_CARE_PROVIDER_SITE_OTHER): Payer: Medicare Other | Admitting: General Practice

## 2018-03-19 DIAGNOSIS — Z7901 Long term (current) use of anticoagulants: Secondary | ICD-10-CM | POA: Diagnosis not present

## 2018-03-19 DIAGNOSIS — I481 Persistent atrial fibrillation: Secondary | ICD-10-CM

## 2018-03-19 DIAGNOSIS — I4819 Other persistent atrial fibrillation: Secondary | ICD-10-CM

## 2018-03-19 LAB — POCT INR: INR: 2.7 (ref 2.0–3.0)

## 2018-03-19 NOTE — Patient Instructions (Signed)
Pre visit review using our clinic review tool, if applicable. No additional management support is needed unless otherwise documented below in the visit note.  Continue to take 1 tablet daily.  Re-check in 6 weeks.

## 2018-03-19 NOTE — Progress Notes (Signed)
I have reviewed this visit and I agree on the patient's plan of dosage and recommendations. Keilyn Nadal, DO   

## 2018-03-31 ENCOUNTER — Encounter: Payer: Self-pay | Admitting: Family Medicine

## 2018-03-31 ENCOUNTER — Other Ambulatory Visit: Payer: Self-pay | Admitting: Cardiovascular Disease

## 2018-03-31 ENCOUNTER — Ambulatory Visit (INDEPENDENT_AMBULATORY_CARE_PROVIDER_SITE_OTHER): Payer: Medicare Other | Admitting: Family Medicine

## 2018-03-31 VITALS — BP 110/68 | HR 52 | Temp 98.3°F | Ht 69.0 in | Wt 185.6 lb

## 2018-03-31 DIAGNOSIS — N390 Urinary tract infection, site not specified: Secondary | ICD-10-CM | POA: Diagnosis not present

## 2018-03-31 DIAGNOSIS — M2550 Pain in unspecified joint: Secondary | ICD-10-CM

## 2018-03-31 DIAGNOSIS — E781 Pure hyperglyceridemia: Secondary | ICD-10-CM | POA: Diagnosis not present

## 2018-03-31 DIAGNOSIS — S39012A Strain of muscle, fascia and tendon of lower back, initial encounter: Secondary | ICD-10-CM

## 2018-03-31 MED ORDER — OXYCODONE-ACETAMINOPHEN 5-325 MG PO TABS: 1.0000 | ORAL_TABLET | Freq: Three times a day (TID) | ORAL | 0 refills | Status: DC

## 2018-03-31 MED ORDER — ATORVASTATIN CALCIUM 10 MG PO TABS: 10.0000 mg | ORAL_TABLET | Freq: Every day | ORAL | 1 refills | Status: DC

## 2018-03-31 NOTE — Progress Notes (Signed)
Marcus Galloway is a 77 y.o. male is here for follow up.  History of Present Illness:   [name], CMA acting as scribe for Dr. Briscoe Deutscher.   JQB:HALPFXT in office for follow up. He has history of back pain in the past. About 10 days ago he was lifting  something heavy and new that it was going to be a problems. The next day he bent to put something up and the pain started. He is not able to sleep in the bed, bend over or stand up straight. He had the same problems a few years ago but not recently. Pain is much worse in the morning and night time.   Health Maintenance Due  Topic Date Due  . PNA vac Low Risk Adult (1 of 2 - PCV13) 05/23/2005   Depression screen [facility] 2/9 12/27/2017 07/12/2017 01/14/2017  Decreased Interest 0 3 0  Down, Depressed, Hopeless 0 3 0  PHQ - 2 Score 0 6 0  Altered sleeping 0 0 -  Tired, decreased energy 1 3 -  Change in appetite 0 0 -  Feeling bad or failure about yourself  0 3 -  Trouble concentrating 0 3 -  Moving slowly or fidgety/restless 0 2 -  Suicidal thoughts 0 1 -  PHQ-9 Score 1 18 -  Difficult doing work/chores - Extremely dIfficult -   PMHx, SurgHx, SocialHx, FamHx, Medications, and Allergies were reviewed in the Visit Navigator and updated as appropriate.   Patient Active Problem List   Diagnosis Date Noted  . 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 Uncomplicated alcohol dependence ([place]), cessation 10/2017 12/28/2017  . Former smoker, stopped smoking in distant past, quit 1980 12/28/2017  . 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 Hypertension 12/28/2017  . 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 Long term (current) use of anticoagulants [Z79.01], for persistent atrial fibrillation and valve replacement 07/19/2017  . 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 Degenerative arthritis of lumbar spine  05/16/2017  . BPH with obstruction/lower urinary tract symptoms, on Flomax 01/15/2017  . Arthralgia of multiple joints, controlled with 1.5 Percocet daily 01/14/2017  . GERD (gastroesophageal reflux disease), on Omeprazole 01/14/2017  . Prostate cancer ([place]) 01/14/2017  . Persistent atrial fibrillation ([place]), on Coumadin 01/14/2017  . Rosacea, controlled with daily Doxycycline 01/14/2017  . Degenerative arthritis of cervical spine 01/14/2017  . Hyperlipidemia, on Lipitor   . S/P aortic valve replacement 10/17/2012  . 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 Smoking status: Former Research scientist (life sciences)  . Smokeless tobacco: Never Used  Substance Use Topics  . Alcohol use: Yes    Comment: 2-3 drinks daily at night  . Drug use: No   Current Medications and Allergies:   Current Outpatient Medications:  .  atorvastatin (LIPITOR) 10 MG tablet, Take 1 tablet (10 mg total) by mouth daily., Disp: 90 tablet, Rfl: 1 .  diltiazem (CARTIA XT) 180 MG 24 hr capsule, Take 1 capsule (180 mg total) by mouth daily. <PLEASE MAKE APPOINTMENT FOR REFILLS>, Disp: 30 capsule, Rfl: 0 .  metoprolol tartrate (LOPRESSOR) 50 MG tablet, TAKE 1 TABLET BY MOUTH 2 TIMES A DAY, Disp: 180 tablet, Rfl: 1 .  OMEPRAZOLE PO, Take 20 mg by mouth daily., Disp: , Rfl:  .  oxyCODONE-acetaminophen (ROXICET) 5-325 MG tablet, Take 1 tablet by mouth 3 (three) times daily.  May fill 02/26/2018, Disp: 90 tablet, Rfl: 0 .  tamsulosin (FLOMAX) 0.4 MG CAPS capsule, TAKE ONE CAPSULE BY MOUTH DAILY, Disp: 90 capsule, Rfl: 1 .  warfarin (COUMADIN) 5 MG tablet, Take 1 tablet daily  Or AS DIRECTED BY ANTICOAGULATION CLINIC., Disp: 100 tablet, Rfl: 1  No Known Allergies   Review of Systems   Pertinent items are noted in the HPI. Otherwise, ROS is negative.  Vitals:   Vitals:   03/31/18 1056  BP: 110/68  Pulse: (!) 52  Temp: 98.3 F (36.8 C)  TempSrc: Oral  SpO2: 97%  Weight: 185 lb 9.6 oz (84.2 kg)    Height: 5\' 9"  (1.753 m)     Body mass index is 27.41 kg/m.  Physical Exam:   Physical Exam  Constitutional: He is oriented to person, place, and time. He appears well-developed and well-nourished. No distress.  HENT:  Head: Normocephalic and atraumatic.  Right Ear: External ear normal.  Left Ear: External ear normal.  Nose: Nose normal.  Mouth/Throat: Oropharynx is clear and moist.  Eyes: Pupils are equal, round, and reactive to light. Conjunctivae and EOM are normal.  Neck: Normal range of motion. Neck supple.  Cardiovascular: Normal rate, regular rhythm, normal heart sounds and intact distal pulses.  Pulmonary/Chest: Effort normal and breath sounds normal.  Abdominal: Soft. Bowel sounds are normal.  Musculoskeletal:       Lumbar back: He exhibits decreased range of motion and spasm.       Back:  Neurological: He is alert and oriented to person, place, and time.  Skin: Skin is warm and dry.  Psychiatric: He has a normal mood and affect. His behavior is normal. Judgment and thought content normal.  Nursing note and vitals reviewed.   Results for orders placed or performed in visit on 03/19/18  POCT INR  Result Value Ref Range   INR 2.7 2.0 - 3.0   Assessment and Plan:   Earsel was seen today for follow-up.  Diagnoses and all orders for this visit:  Strain of lumbar region, initial encounter, right Comments: Offered PT.   Arthralgia of multiple sites, bilateral Comments: Refill today. Orders: -     oxyCODONE-acetaminophen (ROXICET) 5-325 MG tablet; Take 1 tablet by mouth 3 (three) times daily. May fill 02/26/2018  Pure hyperglyceridemia -     atorvastatin (LIPITOR) 10 MG tablet; Take 1 tablet (10 mg total) by mouth daily.   . Reviewed expectations re: course of current medical issues. . Discussed self-management of symptoms. . Outlined signs and symptoms indicating need for more acute intervention. . Patient verbalized understanding and all questions were  answered. Marland Kitchen Health Maintenance issues including appropriate healthy diet, exercise, and smoking avoidance were discussed with patient. . See orders for this visit as documented in the electronic medical record. . Patient received an After Visit Summary.  Briscoe Deutscher, DO [place], [place] 04/06/2018  Future Appointments  Date Time Provider [place]  04/30/2018 11:15 AM [facility]  07/17/2018  1:00 PM Williemae Area, RN [facility]  07/28/2018 11:00 AM Briscoe Deutscher, DO [facility]  03/11/2019  2:30 PM Pieter Partridge, DO [facility] None   CMA served as scribe during this visit. History, Physical, and Plan performed by medical provider. The above documentation has been reviewed and is accurate and complete. Briscoe Deutscher, D.O.

## 2018-04-06 ENCOUNTER — Encounter: Payer: Self-pay | Admitting: Family Medicine

## 2018-04-30 ENCOUNTER — Ambulatory Visit (INDEPENDENT_AMBULATORY_CARE_PROVIDER_SITE_OTHER): Payer: Medicare Other | Admitting: General Practice

## 2018-04-30 DIAGNOSIS — Z7901 Long term (current) use of anticoagulants: Secondary | ICD-10-CM

## 2018-04-30 LAB — POCT INR: INR: 2.9 (ref 2.0–3.0)

## 2018-04-30 NOTE — Progress Notes (Signed)
I have reviewed this visit and I agree on the patient's plan of dosage and recommendations. Clessie Karras, DO   

## 2018-04-30 NOTE — Patient Instructions (Addendum)
Pre visit review using our clinic review tool, if applicable. No additional management support is needed unless otherwise documented below in the visit note.  Continue to take 1 tablet daily.  Re-check in 6 weeks.

## 2018-05-15 ENCOUNTER — Other Ambulatory Visit: Payer: Self-pay | Admitting: Family Medicine

## 2018-05-15 DIAGNOSIS — M2550 Pain in unspecified joint: Secondary | ICD-10-CM

## 2018-05-15 NOTE — Telephone Encounter (Signed)
Ok to refill 

## 2018-05-15 NOTE — Telephone Encounter (Signed)
MEDICATION:  oxyCODONE-acetaminophen (ROXICET) 5-325 MG tablet  PHARMACY:   [facility], [place] [phone] (Phone) [phone] (Fax)      IS THIS A 90 DAY SUPPLY : Y  IS PATIENT OUT OF MEDICATION: N  IF NOT; HOW MUCH IS LEFT: Pt states "maybe 30-35"  LAST APPOINTMENT DATE: @6 /12/2017  NEXT APPOINTMENT DATE:@8 /04/2018  OTHER COMMENTS:    **Let patient know to contact pharmacy at the end of the day to make sure medication is ready. **  ** Please notify patient to allow 48-72 hours to process**  **Encourage patient to contact the pharmacy for refills or they can request refills through [facility]**

## 2018-05-16 NOTE — Telephone Encounter (Signed)
Yes, okay to refill if due.

## 2018-05-20 MED ORDER — OXYCODONE-ACETAMINOPHEN 5-325 MG PO TABS: 1.0000 | ORAL_TABLET | Freq: Three times a day (TID) | ORAL | 0 refills | Status: DC

## 2018-05-20 NOTE — Telephone Encounter (Signed)
Notified patient that prescription is up front for him.

## 2018-05-20 NOTE — Telephone Encounter (Signed)
Can you please e scribe this.

## 2018-05-20 NOTE — Addendum Note (Signed)
Addended by: Durwin Glaze on: 05/20/2018 08:10 AM   Modules accepted: Orders

## 2018-06-04 ENCOUNTER — Ambulatory Visit (INDEPENDENT_AMBULATORY_CARE_PROVIDER_SITE_OTHER): Payer: Medicare Other | Admitting: General Practice

## 2018-06-04 DIAGNOSIS — Z7901 Long term (current) use of anticoagulants: Secondary | ICD-10-CM | POA: Diagnosis not present

## 2018-06-04 LAB — POCT INR: INR: 2.1 (ref 2.0–3.0)

## 2018-06-04 NOTE — Progress Notes (Signed)
I have reviewed this visit and I agree on the patient's plan of dosage and recommendations. Mylea Roarty, DO   

## 2018-06-04 NOTE — Patient Instructions (Addendum)
Pre visit review using our clinic review tool, if applicable. No additional management support is needed unless otherwise documented below in the visit note.  Continue to take 1 tablet daily.  Re-check in 6 weeks.

## 2018-07-14 ENCOUNTER — Ambulatory Visit: Payer: Medicare Other | Admitting: *Deleted

## 2018-07-16 ENCOUNTER — Ambulatory Visit (INDEPENDENT_AMBULATORY_CARE_PROVIDER_SITE_OTHER): Payer: Medicare Other | Admitting: General Practice

## 2018-07-16 DIAGNOSIS — Z7901 Long term (current) use of anticoagulants: Secondary | ICD-10-CM | POA: Diagnosis not present

## 2018-07-16 LAB — POCT INR: INR: 2.1 (ref 2.0–3.0)

## 2018-07-16 NOTE — Patient Instructions (Signed)
Pre visit review using our clinic review tool, if applicable. No additional management support is needed unless otherwise documented below in the visit note.  Continue to take 1 tablet daily.  Re-check in 6 weeks.

## 2018-07-16 NOTE — Progress Notes (Signed)
I have reviewed this visit and I agree on the patient's plan of dosage and recommendations. Ondria Oswald, DO   

## 2018-07-17 ENCOUNTER — Ambulatory Visit (INDEPENDENT_AMBULATORY_CARE_PROVIDER_SITE_OTHER): Payer: Medicare Other

## 2018-07-17 ENCOUNTER — Encounter: Payer: Self-pay | Admitting: Family Medicine

## 2018-07-17 DIAGNOSIS — Z23 Encounter for immunization: Secondary | ICD-10-CM

## 2018-07-17 DIAGNOSIS — Z0000 Encounter for general adult medical examination without abnormal findings: Secondary | ICD-10-CM

## 2018-07-17 NOTE — Progress Notes (Signed)
I have reviewed and agree with note, evaluation, plan. Thankful for follow up with Dr. [name] on 07/28/18 for opportunity to evaluate mood in light of Susan's concerns.   Garret Reddish, MD

## 2018-07-17 NOTE — Patient Instructions (Addendum)
Marcus Galloway , Thank you for taking time to come for your Medicare Wellness Visit. I appreciate your ongoing commitment to your health goals. Please review the following plan we discussed and let me know if I can assist you in the future.   Please keep apt with Marcus Galloway to discuss your current mood and losses.   Would love to see you get you some; join a club, go to the y, go fishing; find something   Feels he had pneumonia vaccines;    These are the goals we discussed: Goals    . Patient Stated     Find something you can do that you enjoy!  Think about it and get out of your home.        This is a list of the screening recommended for you and due dates:  Health Maintenance  Topic Date Due  . Pneumonia vaccines (1 of 2 - PCV13) 05/23/2005  . Flu Shot  05/29/2018  . Tetanus Vaccine  07/10/2020      Fall Prevention in the Home Falls can cause injuries. They can happen to people of all ages. There are many things you can do to make your home safe and to help prevent falls. What can I do on the outside of my home?  Regularly fix the edges of walkways and driveways and fix any cracks.  Remove anything that might make you trip as you walk through a door, such as a raised step or threshold.  Trim any bushes or trees on the path to your home.  Use bright outdoor lighting.  Clear any walking paths of anything that might make someone trip, such as rocks or tools.  Regularly check to see if handrails are loose or broken. Make sure that both sides of any steps have handrails.  Any raised decks and porches should have guardrails on the edges.  Have any leaves, snow, or ice cleared regularly.  Use sand or salt on walking paths during winter.  Clean up any spills in your garage right away. This includes oil or grease spills. What can I do in the bathroom?  Use night lights.  Install grab bars by the toilet and in the tub and shower. Do not use towel bars as grab bars.  Use  non-skid mats or decals in the tub or shower.  If you need to sit down in the shower, use a plastic, non-slip stool.  Keep the floor dry. Clean up any water that spills on the floor as soon as it happens.  Remove soap buildup in the tub or shower regularly.  Attach bath mats securely with double-sided non-slip rug tape.  Do not have throw rugs and other things on the floor that can make you trip. What can I do in the bedroom?  Use night lights.  Make sure that you have a light by your bed that is easy to reach.  Do not use any sheets or blankets that are too big for your bed. They should not hang down onto the floor.  Have a firm chair that has side arms. You can use this for support while you get dressed.  Do not have throw rugs and other things on the floor that can make you trip. What can I do in the kitchen?  Clean up any spills right away.  Avoid walking on wet floors.  Keep items that you use a lot in easy-to-reach places.  If you need to reach something above you, use a  strong step stool that has a grab bar.  Keep electrical cords out of the way.  Do not use floor polish or wax that makes floors slippery. If you must use wax, use non-skid floor wax.  Do not have throw rugs and other things on the floor that can make you trip. What can I do with my stairs?  Do not leave any items on the stairs.  Make sure that there are handrails on both sides of the stairs and use them. Fix handrails that are broken or loose. Make sure that handrails are as long as the stairways.  Check any carpeting to make sure that it is firmly attached to the stairs. Fix any carpet that is loose or worn.  Avoid having throw rugs at the top or bottom of the stairs. If you do have throw rugs, attach them to the floor with carpet tape.  Make sure that you have a light switch at the top of the stairs and the bottom of the stairs. If you do not have them, ask someone to add them for you. What  else can I do to help prevent falls?  Wear shoes that: ? Do not have high heels. ? Have rubber bottoms. ? Are comfortable and fit you well. ? Are closed at the toe. Do not wear sandals.  If you use a stepladder: ? Make sure that it is fully opened. Do not climb a closed stepladder. ? Make sure that both sides of the stepladder are locked into place. ? Ask someone to hold it for you, if possible.  Clearly mark and make sure that you can see: ? Any grab bars or handrails. ? First and last steps. ? Where the edge of each step is.  Use tools that help you move around (mobility aids) if they are needed. These include: ? Canes. ? Walkers. ? Scooters. ? Crutches.  Turn on the lights when you go into a dark area. Replace any light bulbs as soon as they burn out.  Set up your furniture so you have a clear path. Avoid moving your furniture around.  If any of your floors are uneven, fix them.  If there are any pets around you, be aware of where they are.  Review your medicines with your doctor. Some medicines can make you feel dizzy. This can increase your chance of falling. Ask your doctor what other things that you can do to help prevent falls. This information is not intended to replace advice given to you by your health care provider. Make sure you discuss any questions you have with your health care provider. Document Released: 08/11/2009 Document Revised: 03/22/2016 Document Reviewed: 11/19/2014 Elsevier Interactive Patient Education  2018 [place] Maintenance, Male A healthy lifestyle and preventive care is important for your health and wellness. Ask your health care provider about what schedule of regular examinations is right for you. What should I know about weight and diet? Eat a Healthy Diet  Eat plenty of vegetables, fruits, whole grains, low-fat dairy products, and lean protein.  Do not eat a lot of foods high in solid fats, added sugars, or  salt.  Maintain a Healthy Weight Regular exercise can help you achieve or maintain a healthy weight. You should:  Do at least 150 minutes of exercise each week. The exercise should increase your heart rate and make you sweat (moderate-intensity exercise).  Do strength-training exercises at least twice a week.  Watch Your Levels of Cholesterol and  Blood Lipids  Have your blood tested for lipids and cholesterol every 5 years starting at 78 years of age. If you are at high risk for heart disease, you should start having your blood tested when you are 78 years old. You may need to have your cholesterol levels checked more often if: ? Your lipid or cholesterol levels are high. ? You are older than 78 years of age. ? You are at high risk for heart disease.  What should I know about cancer screening? Many types of cancers can be detected early and may often be prevented. Lung Cancer  You should be screened every year for lung cancer if: ? You are a current smoker who has smoked for at least 30 years. ? You are a former smoker who has quit within the past 15 years.  Talk to your health care provider about your screening options, when you should start screening, and how often you should be screened.  Colorectal Cancer  Routine colorectal cancer screening usually begins at 78 years of age and should be repeated every 5-10 years until you are 78 years old. You may need to be screened more often if early forms of precancerous polyps or small growths are found. Your health care provider may recommend screening at an earlier age if you have risk factors for colon cancer.  Your health care provider may recommend using home test kits to check for hidden blood in the stool.  A small camera at the end of a tube can be used to examine your colon (sigmoidoscopy or colonoscopy). This checks for the earliest forms of colorectal cancer.  Prostate and Testicular Cancer  Depending on your age and overall  health, your health care provider may do certain tests to screen for prostate and testicular cancer.  Talk to your health care provider about any symptoms or concerns you have about testicular or prostate cancer.  Skin Cancer  Check your skin from head to toe regularly.  Tell your health care provider about any new moles or changes in moles, especially if: ? There is a change in a mole's size, shape, or color. ? You have a mole that is larger than a pencil eraser.  Always use sunscreen. Apply sunscreen liberally and repeat throughout the day.  Protect yourself by wearing long sleeves, pants, a wide-brimmed hat, and sunglasses when outside.  What should I know about heart disease, diabetes, and high blood pressure?  If you are 19-67 years of age, have your blood pressure checked every 3-5 years. If you are 44 years of age or older, have your blood pressure checked every year. You should have your blood pressure measured twice-once when you are at a hospital or clinic, and once when you are not at a hospital or clinic. Record the average of the two measurements. To check your blood pressure when you are not at a hospital or clinic, you can use: ? An automated blood pressure machine at a pharmacy. ? A home blood pressure monitor.  Talk to your health care provider about your target blood pressure.  If you are between 90-18 years old, ask your health care provider if you should take aspirin to prevent heart disease.  Have regular diabetes screenings by checking your fasting blood sugar level. ? If you are at a normal weight and have a low risk for diabetes, have this test once every three years after the age of 75. ? If you are overweight and have a high risk  for diabetes, consider being tested at a younger age or more often.  A one-time screening for abdominal aortic aneurysm (AAA) by ultrasound is recommended for men aged 63-75 years who are current or former smokers. What should I know  about preventing infection? Hepatitis B If you have a higher risk for hepatitis B, you should be screened for this virus. Talk with your health care provider to find out if you are at risk for hepatitis B infection. Hepatitis C Blood testing is recommended for:  Everyone born from [birth year] through 1966.  Anyone with known risk factors for hepatitis C.  Sexually Transmitted Diseases (STDs)  You should be screened each year for STDs including gonorrhea and chlamydia if: ? You are sexually active and are younger than 78 years of age. ? You are older than 78 years of age and your health care provider tells you that you are at risk for this type of infection. ? Your sexual activity has changed since you were last screened and you are at an increased risk for chlamydia or gonorrhea. Ask your health care provider if you are at risk.  Talk with your health care provider about whether you are at high risk of being infected with HIV. Your health care provider may recommend a prescription medicine to help prevent HIV infection.  What else can I do?  Schedule regular health, dental, and eye exams.  Stay current with your vaccines (immunizations).  Do not use any tobacco products, such as cigarettes, chewing tobacco, and e-cigarettes. If you need help quitting, ask your health care provider.  Limit alcohol intake to no more than 2 drinks per day. One drink equals 12 ounces of beer, 5 ounces of wine, or 1 ounces of hard liquor.  Do not use street drugs.  Do not share needles.  Ask your health care provider for help if you need support or information about quitting drugs.  Tell your health care provider if you often feel depressed.  Tell your health care provider if you have ever been abused or do not feel safe at home. This information is not intended to replace advice given to you by your health care provider. Make sure you discuss any questions you have with your health care  provider. Document Released: 04/12/2008 Document Revised: 06/13/2016 Document Reviewed: 07/19/2015 Elsevier Interactive Patient Education  2018 [employer].   Hearing Loss Hearing loss is a partial or total loss of the ability to hear. This can be temporary or permanent, and it can happen in one or both ears. Hearing loss may be referred to as deafness. Medical care is necessary to treat hearing loss properly and to prevent the condition from getting worse. Your hearing may partially or completely come back, depending on what caused your hearing loss and how severe it is. In some cases, hearing loss is permanent. What are the causes? Common causes of hearing loss include:  Too much wax in the ear canal.  Infection of the ear canal or middle ear.  Fluid in the middle ear.  Injury to the ear or surrounding area.  An object stuck in the ear.  Prolonged exposure to loud sounds, such as music.  Less common causes of hearing loss include:  Tumors in the ear.  Viral or bacterial infections, such as meningitis.  A hole in the eardrum (perforated eardrum).  Problems with the hearing nerve that sends signals between the brain and the ear.  Certain medicines.  What are the signs or symptoms?  Symptoms of this condition may include:  Difficulty telling the difference between sounds.  Difficulty following a conversation when there is background noise.  Lack of response to sounds in your environment. This may be most noticeable when you do not respond to startling sounds.  Needing to turn up the volume on the television, radio, etc.  Ringing in the ears.  Dizziness.  Pain in the ears.  How is this diagnosed? This condition is diagnosed based on a physical exam and a hearing test (audiometry). The audiometry test will be performed by a hearing specialist (audiologist). You may also be referred to an ear, nose, and throat (ENT) specialist (otolaryngologist). How is this  treated? Treatment for recent onset of hearing loss may include:  Ear wax removal.  Being prescribed medicines to prevent infection (antibiotics).  Being prescribed medicines to reduce inflammation (corticosteroids).  Follow these instructions at home:  If you were prescribed an antibiotic medicine, take it as told by your health care provider. Do not stop taking the antibiotic even if you start to feel better.  Take over-the-counter and prescription medicines only as told by your health care provider.  Avoid loud noises.  Return to your normal activities as told by your health care provider. Ask your health care provider what activities are safe for you.  Keep all follow-up visits as told by your health care provider. This is important. Contact a health care provider if:  You feel dizzy.  You develop new symptoms.  You vomit or feel nauseous.  You have a fever. Get help right away if:  You develop sudden changes in your vision.  You have severe ear pain.  You have new or increased weakness.  You have a severe headache. This information is not intended to replace advice given to you by your health care provider. Make sure you discuss any questions you have with your health care provider. Document Released: 10/15/2005 Document Revised: 03/22/2016 Document Reviewed: 03/02/2015 Elsevier Interactive Patient Education  2018 [employer].

## 2018-07-17 NOTE — Progress Notes (Signed)
Subjective:   Marcus Galloway is a 77 y.o. male who presents for Medicare Annual/Subsequent preventive examination.  Reports health as failr States vertigo is bothering him now Will see Dr. [name] 9/30   Wife passed 2005; was in [place]  Retired 2008  3 dtr now live in [place] in apt beside of the office (one bedroom )  Downstairs  2 grand children 8yo; and one 43 and 20  Moved from [place] x 1.77 yo    Last ov 6/3 for lumbar strain Hx polio  Prostate cancer  Talks about his family; as he was an orphaned with his brother at 89yo   Diet Chol/hdl2.6  Does not cook but uses microwave  Breakfast microwave cruisants or biscuits Lunch - whatever is available Supper - ice cream sandwiches    Exercise Does not exercise at this time   Etoh 2 to 3 drinks at hs - quit Jan 10  Former smoker; aged out of [place] AVR replaced 09/2012    Health Maintenance Due  Topic Date Due  . PNA vac Low Risk Adult (1 of 2 - PCV13) 05/23/2005    Cardiac Risk Factors include: advanced age (>56men, >3 women);family history of premature cardiovascular disease;hypertension;male gender   (last year thought he rec'd at [place] [facility])  Declined pneumonia today   Colonoscopy 08/2017        Objective:    Vitals: BP 100/70   Pulse (!) 55   Ht 5\' 9"  (1.753 m)   Wt 187 lb (84.8 kg)   SpO2 99%   BMI 27.62 kg/m   Body mass index is 27.62 kg/m.  Advanced Directives 07/17/2018 07/12/2017  Does Patient Have a Medical Advance Directive? Yes Yes  Type of Advance Directive - [place];Living will  Does patient want to make changes to medical advance directive? - Yes (MAU/Ambulatory/Procedural Areas - Information given)  Copy of [place] in Chart? - No - copy requested    Tobacco Social History   Tobacco Use  Smoking Status Former Smoker  . Packs/day: 3.00  . Years: 30.00  . Pack years: 90.00  Smokeless Tobacco Never Used    Tobacco Comment   quit smoking 1980     Counseling given: Yes Comment: quit smoking 1980   Clinical Intake:     Past Medical History:  Diagnosis Date  . Aortic valve disease   . Atrial fibrillation ([place])   . BPH (benign prostatic hyperplasia)   . Chronic neck pain   . GERD (gastroesophageal reflux disease)   . History of colonic polyps   . Hyperlipidemia   . Hypertension   . Osteoarthritis   . Persistent atrial fibrillation ([place])   . Polio   . Prostate cancer [facility])    Past Surgical History:  Procedure Laterality Date  . 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  . CARDIOVERSION  09/2016  . HERNIA REPAIR  1990  . INSERTION PROSTATE RADIATION SEED  2002   Family History  Adopted: Yes  Family history unknown: Yes   Social History   Socioeconomic History  . Marital status: Widowed    Spouse name: Not on file  . Number of children: 4  . Years of education: 40  . Highest education level: Not on file  Occupational History  . Occupation: Retired  Scientific laboratory technician  . Financial resource strain: Not on file  . Food insecurity:    Worry: Not on file  Inability: Not on file  . Transportation needs:    Medical: Not on file    Non-medical: Not on file  Tobacco Use  . Smoking status: Former Smoker    Packs/day: 3.00    Years: 30.00    Pack years: 90.00  . Smokeless tobacco: Never Used  . Tobacco comment: quit smoking 1980  Substance and Sexual Activity  . Alcohol use: Yes    Comment: quit Jan 2019  . Drug use: No  . Sexual activity: Not Currently  Lifestyle  . Physical activity:    Days per week: Not on file    Minutes per session: Not on file  . Stress: Not on file  Relationships  . Social connections:    Talks on phone: Not on file    Gets together: Not on file    Attends religious service: Not on file    Active member of club or organization: Not on file    Attends meetings of clubs or organizations: Not on file    Relationship  status: Not on file  Other Topics Concern  . Not on file  Social History Narrative   Lives alone, recently moved back to [place] from [facility] to be closer to his children   Right-handed   Caffeine: 3 cups each morning    Outpatient Encounter Medications as of 07/17/2018  Medication Sig  . atorvastatin (LIPITOR) 10 MG tablet Take 1 tablet (10 mg total) by mouth daily.  Marland Kitchen CARTIA XT 180 MG 24 hr capsule TAKE ONE CAPSULE BY MOUTH DAILY ( NEEDS TO MAKE APPT)  . metoprolol tartrate (LOPRESSOR) 50 MG tablet TAKE 1 TABLET BY MOUTH 2 TIMES A DAY  . OMEPRAZOLE PO Take 20 mg by mouth daily.  Marland Kitchen oxyCODONE-acetaminophen (ROXICET) 5-325 MG tablet Take 1 tablet by mouth 3 (three) times daily. May fill 02/26/2018  . tamsulosin (FLOMAX) 0.4 MG CAPS capsule TAKE ONE CAPSULE BY MOUTH DAILY  . warfarin (COUMADIN) 5 MG tablet Take 1 tablet daily  Or AS DIRECTED BY ANTICOAGULATION CLINIC.   No facility-administered encounter medications on file as of 07/17/2018.     Activities of Daily Living In your present state of health, do you have any difficulty performing the following activities: 07/17/2018  Hearing? Y  Vision? N  Difficulty concentrating or making decisions? N  Walking or climbing stairs? N  Dressing or bathing? N  Doing errands, shopping? N  Preparing Food and eating ? N  Using the Toilet? N  In the past six months, have you accidently leaked urine? N  Do you have problems with loss of bowel control? N  Managing your Medications? N  Managing your Finances? N  Housekeeping or managing your Housekeeping? N  Some recent data might be hidden    Patient Care Team: Marcus Deutscher, DO as PCP - General (Family Medicine) Troy Sine, MD as Consulting Physician (Cardiology)   Assessment:   This is a routine wellness examination for [name].  Exercise Activities and Dietary recommendations Current Exercise Habits: Home exercise routine  Goals    . Patient Stated     Find something you can do that you  enjoy!  Think about it and get out of your home.        Fall Risk Fall Risk  07/17/2018 03/10/2018 09/10/2017 08/21/2017 07/12/2017  Falls in the past year? 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  Feels something happened after his heart surgery   States he likes "being depressed". Does like TV and not sure he wants to change. Feels he is happy with his life, would be happier if he could still ride his motorcycle  May be related to medical history   Also fear of falling   Discussed the bored and needs socialization  Cognitive Function MMSE - Mini Mental State Exam 07/17/2018 07/12/2017  Orientation to time 5 5  Orientation to Place 5 5  Registration 3 3  Attention/ Calculation 4 5  Attention/Calculation-comments wrote the serial 7's  -  Recall 3 2  Language- name 2 objects 2 2  Language- repeat 1 1  Language- follow 3 step command 3 3  Language- read & follow direction 1 1  Write a sentence 1 1  Copy design 1 1  Total score 29 29   Montreal Cognitive Assessment  03/10/2018  Visuospatial/ Executive (0/5) 4  Naming (0/3) 3  Attention: Read list of digits (0/2) 2  Attention: Read list of letters (0/1) 1  Attention: Serial 7 subtraction starting at 100 (0/3) 1  Language: Repeat phrase (0/2) 1  Language : Fluency (0/1) 0  Abstraction (0/2) 1  Delayed Recall (0/5) 0  Orientation (0/6) 6  Total 19  Adjusted Score (based on education) 20      Immunization History  Administered Date(s) Administered  . Influenza, High Dose Seasonal PF 07/17/2018  . Influenza,inj,Quad PF,6+ Mos 07/30/2017      Screening Tests Health Maintenance  Topic Date Due    . PNA vac Low Risk Adult (1 of 2 - PCV13) 05/23/2005  . TETANUS/TDAP  07/10/2020  . 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  Feels something happened after his heart surgery and his memory is not as good  Discussed medication or counseling but declineds  States he likes "being depressed". Does like TV and not sure he wants to change. Feels he is happy with his life, would be happier if he could still ride his motorcycle  May be related to medical history; talked about living in an orphanage.  Denies SI   Also fear of falling   Discussed the bored and needs socialization and encouraged him to get out of the home a few times a week.  He has an apt with Dr. [name] 07/28/2018  Can evaluate mood at that time.      Referrals  none  Patient concerns; As noted  Nurse Concerns; As noted  Next PCP apt 07/28/2018      I have personally reviewed and noted the following in the patient's chart:   . Medical and social history . Use of alcohol, tobacco or illicit drugs  . Current medications and supplements . Functional ability and status .  Nutritional status . Physical activity . Advanced directives . List of other physicians . Hospitalizations, surgeries, and ER visits in previous 12 months . Vitals . Screenings to include cognitive, depression, and falls . Referrals and appointments  In addition, I have reviewed and discussed with patient certain preventive protocols, quality metrics, and best practice recommendations. A written personalized care plan for preventive services as well as general preventive health recommendations were provided to  patient.     Wynetta Fines, RN  07/17/2018

## 2018-07-24 ENCOUNTER — Other Ambulatory Visit: Payer: Self-pay

## 2018-07-24 ENCOUNTER — Encounter: Payer: Medicare Other | Admitting: Family Medicine

## 2018-07-24 ENCOUNTER — Telehealth: Payer: Self-pay | Admitting: Family Medicine

## 2018-07-24 MED ORDER — METOPROLOL TARTRATE 50 MG PO TABS: ORAL_TABLET | ORAL | 1 refills | Status: DC

## 2018-07-24 NOTE — Telephone Encounter (Signed)
New Message   *STAT* If patient is at the pharmacy, call can be transferred to refill team.   1. Which medications need to be refilled? (please list name of each medication and dose if known)  Metoprolol tartrate (Lopressor) 50 mg tablet twice daily  Oxycodone-acetaminophen (Roxcet) 5-325 mg tablet three times daily  2. Which pharmacy/location (including street and city if local pharmacy) is medication to be sent to? [place], [place], [place]  3. Do they need a 30 day or 90 day supply?  30 days

## 2018-07-24 NOTE — Telephone Encounter (Signed)
Called patient has several weeks left and has app next week will address then.

## 2018-07-25 ENCOUNTER — Other Ambulatory Visit: Payer: Self-pay | Admitting: Family Medicine

## 2018-07-27 NOTE — Progress Notes (Signed)
Marcus Galloway is a 77 y.o. male is here for follow up.  History of Present Illness:   [name], CMA acting as scribe for Dr. Briscoe Deutscher.   HPI: Patient in office today for physical. States that he has increased shortness of breath when walking. No chest pain or palpitation. He is still having problems with vertigo. He has done PT for it but has not helped at all.   Health Maintenance Due  Topic Date Due  . PNA vac Low Risk Adult (1 of 2 - PCV13) 05/23/2005   Depression screen [facility] 2/9 07/17/2018 12/27/2017 07/12/2017  Decreased Interest 2 0 3  Down, Depressed, Hopeless (No Data) 0 3  PHQ - 2 Score 2 0 6  Altered sleeping 0 0 0  Tired, decreased energy 2 1 3   Change in appetite 1 0 0  Feeling bad or failure about yourself  - 0 3  Trouble concentrating 2 0 3  Moving slowly or fidgety/restless 0 0 2  Suicidal thoughts 0 0 1  PHQ-9 Score 7 1 18   Difficult doing work/chores Somewhat difficult - Extremely dIfficult   PMHx, SurgHx, SocialHx, FamHx, Medications, and Allergies were reviewed in the Visit Navigator and updated as appropriate.   Patient Active Problem List   Diagnosis Date Noted  . 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 Uncomplicated alcohol dependence ([place]), cessation 10/2017 12/28/2017  . Former smoker, stopped smoking in distant past, quit 1980 12/28/2017  . 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 Hypertension 12/28/2017  . 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 Long term (current) use of anticoagulants [Z79.01], for persistent atrial fibrillation and valve replacement 07/19/2017  . 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 Degenerative arthritis of lumbar spine 05/16/2017  . BPH with obstruction/lower urinary tract symptoms, on Flomax 01/15/2017  . Arthralgia of multiple joints, controlled with 1.5 Percocet daily 01/14/2017    . GERD (gastroesophageal reflux disease), on Omeprazole 01/14/2017  . Prostate cancer ([place]) 01/14/2017  . Persistent atrial fibrillation ([place]), on Coumadin 01/14/2017  . Rosacea, controlled with daily Doxycycline 01/14/2017  . Degenerative arthritis of cervical spine 01/14/2017  . Hyperlipidemia, on Lipitor   . S/P aortic valve replacement 10/17/2012  . 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 Smoking status: Former Smoker    Packs/day: 3.00    Years: 30.00    Pack years: 90.00  . Smokeless tobacco: Never Used  . Tobacco comment: quit smoking 1980  Substance Use Topics  . Alcohol use: Yes    Comment: quit Jan 2019  . Drug use: No   Current Medications and Allergies:   .  atorvastatin (LIPITOR) 10 MG tablet, Take 1 tablet (10 mg total) by mouth daily., Disp: 90 tablet, Rfl: 1 .  CARTIA XT 180 MG 24 hr capsule, TAKE ONE CAPSULE BY MOUTH DAILY ( NEEDS TO MAKE APPT), Disp: 30 capsule, Rfl: 4 .  metoprolol tartrate (LOPRESSOR) 50 MG tablet, TAKE 1 TABLET BY MOUTH 2 TIMES A DAY, Disp: 180 tablet, Rfl: 1 .  OMEPRAZOLE PO, Take 20 mg by mouth daily., Disp: , Rfl:  .  oxyCODONE-acetaminophen (ROXICET) 5-325 MG tablet, Take 1 tablet by mouth 3 (three) times daily. May fill 02/26/2018, Disp: 90 tablet, Rfl: 0 .  tamsulosin (FLOMAX) 0.4 MG CAPS capsule, TAKE ONE CAPSULE BY MOUTH DAILY, Disp: 90 capsule, Rfl: 1 .  warfarin (COUMADIN) 5 MG tablet, TAKE ONE TABLET BY MOUTH DAILY OR AS DIRECTED BY CLINIC  90 day, Disp: 100 tablet, Rfl: 0  No Known Allergies   Review of Systems   Pertinent items are noted in the HPI. Otherwise, ROS is negative.  Vitals:   Vitals:   07/28/18 1056  BP: 110/64  Pulse: (!) 59  Temp: 97.9 F (36.6 C)  TempSrc: Oral  SpO2: 98%  Weight: 189 lb 6.4 oz (85.9 kg)  Height: 5\' 9"  (1.753 m)     Body mass index is 27.97 kg/m.  Physical Exam:   Physical Exam  Constitutional: He is oriented to person, place, and time.  He appears well-developed and well-nourished. No distress.  HENT:  Head: Normocephalic and atraumatic.  Right Ear: External ear normal.  Left Ear: External ear normal.  Nose: Nose normal.  Mouth/Throat: Oropharynx is clear and moist.  Eyes: Pupils are equal, round, and reactive to light. Conjunctivae and EOM are normal.  Neck: Normal range of motion. Neck supple.  Cardiovascular: Normal rate and intact distal pulses. An irregularly irregular rhythm present.  Murmur heard. Pulmonary/Chest: Effort normal and breath sounds normal.  Abdominal: Soft. Bowel sounds are normal.  Musculoskeletal: Normal range of motion.  Neurological: He is alert and oriented to person, place, and time.  Skin: Skin is warm and dry.  Psychiatric: He has a normal mood and affect. His behavior is normal. Judgment and thought content normal.  Nursing note and vitals reviewed.  Results for orders placed or performed in visit on 07/16/18  POCT INR  Result Value Ref Range   INR 2.1 2.0 - 3.0   Assessment and Plan:   Oral was seen today for annual exam.  Diagnoses and all orders for this visit:  Essential hypertension -     CBC with Differential/Platelet -     Comprehensive metabolic panel  Arthralgia of multiple sites, bilateral Comments: Refill today. Orders: -     oxyCODONE-acetaminophen (ROXICET) 5-325 MG tablet; Take 1 tablet by mouth 3 (three) times daily. May fill 02/26/2018  Need for pneumococcal vaccine -     Pneumococcal conjugate vaccine 13-valent  Fatigue, unspecified type -     TSH -     Iron, TIBC and Ferritin Panel  Screening for lipid disorders  Persistent atrial fibrillation ([place]), on Coumadin  Pure hyperglyceridemia -     Lipid panel   . Reviewed expectations re: course of current medical issues. . Discussed self-management of symptoms. . Outlined signs and symptoms indicating need for more acute intervention. . Patient verbalized understanding and all questions were  answered. Marland Kitchen Health Maintenance issues including appropriate healthy diet, exercise, and smoking avoidance were discussed with patient. . See orders for this visit as documented in the electronic medical record. . Patient received an After Visit Summary.  CMA served as Education administrator during this visit. History, Physical, and Plan performed by medical provider. The above documentation has been reviewed and is accurate and complete. Briscoe Deutscher, D.O.  Briscoe Deutscher, DO [place], [facility] 07/28/2018

## 2018-07-28 ENCOUNTER — Encounter: Payer: Self-pay | Admitting: Family Medicine

## 2018-07-28 ENCOUNTER — Ambulatory Visit (INDEPENDENT_AMBULATORY_CARE_PROVIDER_SITE_OTHER): Payer: Medicare Other | Admitting: Family Medicine

## 2018-07-28 ENCOUNTER — Encounter: Payer: Medicare Other | Admitting: Family Medicine

## 2018-07-28 VITALS — BP 110/64 | HR 59 | Temp 97.9°F | Ht 69.0 in | Wt 189.4 lb

## 2018-07-28 DIAGNOSIS — I481 Persistent atrial fibrillation: Secondary | ICD-10-CM

## 2018-07-28 DIAGNOSIS — I4819 Other persistent atrial fibrillation: Secondary | ICD-10-CM

## 2018-07-28 DIAGNOSIS — R5383 Other fatigue: Secondary | ICD-10-CM | POA: Diagnosis not present

## 2018-07-28 DIAGNOSIS — M2550 Pain in unspecified joint: Secondary | ICD-10-CM

## 2018-07-28 DIAGNOSIS — Z13220 Encounter for screening for lipoid disorders: Secondary | ICD-10-CM

## 2018-07-28 DIAGNOSIS — Z23 Encounter for immunization: Secondary | ICD-10-CM | POA: Diagnosis not present

## 2018-07-28 DIAGNOSIS — E781 Pure hyperglyceridemia: Secondary | ICD-10-CM

## 2018-07-28 DIAGNOSIS — I10 Essential (primary) hypertension: Secondary | ICD-10-CM

## 2018-07-28 DIAGNOSIS — Z8529 Personal history of malignant neoplasm of other respiratory and intrathoracic organs: Secondary | ICD-10-CM | POA: Diagnosis not present

## 2018-07-28 LAB — CBC WITH DIFFERENTIAL/PLATELET
Basophils Absolute: 0.1 10*3/uL (ref 0.0–0.1)
Basophils Relative: 1.5 % (ref 0.0–3.0)
Eosinophils Absolute: 0.1 10*3/uL (ref 0.0–0.7)
Eosinophils Relative: 1.3 % (ref 0.0–5.0)
HCT: 39.1 % (ref 39.0–52.0)
Hemoglobin: 13.5 g/dL (ref 13.0–17.0)
Lymphocytes Relative: 25 % (ref 12.0–46.0)
Lymphs Abs: 1.7 10*3/uL (ref 0.7–4.0)
MCHC: 34.5 g/dL (ref 30.0–36.0)
MCV: 88.7 fl (ref 78.0–100.0)
Monocytes Absolute: 0.5 10*3/uL (ref 0.1–1.0)
Monocytes Relative: 7.2 % (ref 3.0–12.0)
Neutro Abs: 4.5 10*3/uL (ref 1.4–7.7)
Neutrophils Relative %: 65 % (ref 43.0–77.0)
Platelets: 194 10*3/uL (ref 150.0–400.0)
RBC: 4.41 Mil/uL (ref 4.22–5.81)
RDW: 13.4 % (ref 11.5–15.5)
WBC: 6.9 10*3/uL (ref 4.0–10.5)

## 2018-07-28 LAB — LIPID PANEL
Cholesterol: 119 mg/dL (ref 0–200)
HDL: 40.3 mg/dL (ref >39.00)
LDL Cholesterol: 56 mg/dL (ref 0–99)
NonHDL: 78.54
Total CHOL/HDL Ratio: 3
Triglycerides: 114 mg/dL (ref 0.0–149.0)
VLDL: 22.8 mg/dL (ref 0.0–40.0)

## 2018-07-28 LAB — COMPREHENSIVE METABOLIC PANEL
ALT: 20 U/L (ref 0–53)
AST: 21 U/L (ref 0–37)
Albumin: 4 g/dL (ref 3.5–5.2)
Alkaline Phosphatase: 90 U/L (ref 39–117)
BUN: 12 mg/dL (ref 6–23)
CO2: 30 mEq/L (ref 19–32)
Calcium: 9.1 mg/dL (ref 8.4–10.5)
Chloride: 104 mEq/L (ref 96–112)
Creatinine, Ser: 0.81 mg/dL (ref 0.40–1.50)
GFR: 97.91 mL/min (ref >60.00)
Glucose, Bld: 93 mg/dL (ref 70–99)
Potassium: 4.2 mEq/L (ref 3.5–5.1)
Sodium: 141 mEq/L (ref 135–145)
Total Bilirubin: 0.9 mg/dL (ref 0.2–1.2)
Total Protein: 6.2 g/dL (ref 6.0–8.3)

## 2018-07-28 LAB — TSH: TSH: 1.33 u[IU]/mL (ref 0.35–4.50)

## 2018-07-28 MED ORDER — OXYCODONE-ACETAMINOPHEN 5-325 MG PO TABS: 1.0000 | ORAL_TABLET | Freq: Three times a day (TID) | ORAL | 0 refills | Status: AC

## 2018-07-28 NOTE — Patient Instructions (Signed)
.Vaccine Information Statement   Pneumococcal Conjugate Vaccine (PCV13): What You Need to Know  Many Vaccine Information Statements are available in Spanish and other languages. See AbsolutelyGenuine.com.br. Hojas de informacin Sobre Vacunas estn disponibles en espaol y en muchos otros idiomas. Visite AbsolutelyGenuine.com.br.  1. Why get vaccinated?  Vaccination can protect both children and adults from pneumococcal disease.  Pneumococcal disease is caused by bacteria that can spread from person to person through close contact.  It can cause ear infections, and it can also lead to more serious infections of the: ; Lungs (pneumonia), ; Blood (bacteremia), and ; Covering of the brain and spinal cord (meningitis). Pneumococcal pneumonia is most common among adults. Pneumococcal meningitis can cause deafness and brain damage, and it kills about 1 child in 10 who get it.  Anyone can get pneumococcal disease, but children under 51 years of age and adults 59 years and older, people with certain medical conditions, and cigarette smokers are at the highest risk.   Before there was a vaccine, the [place] saw: ; more than 700 cases of meningitis, ; about 13,000 blood infections, ; about 5 million ear infections, and ; about 200 deaths in children under 5 each year from pneumococcal disease. Since vaccine became available, severe pneumococcal disease in these children has fallen by 88%.  About 18,000 older adults die of pneumococcal disease each year in the [place].  Treatment of pneumococcal infections with penicillin and other drugs is not as effective as it used to be, because some strains of the disease have become resistant to these drugs. This makes prevention of the disease, through vaccination, even more important.  2. PCV13 vaccine  Pneumococcal conjugate vaccine (called PCV13) protects against 13 types of pneumococcal bacteria.    PCV13 is routinely given to children at 2, 4,  6, and 36-78 months of age. It is also recommended for children and adults 4 to 36 years of age with certain health conditions, and for all adults 21 years of age and older. Your doctor can give you details.  3. Some people should not get this vaccine  Anyone who has ever had a life-threatening allergic reaction to a dose of this vaccine, to an earlier pneumococcal vaccine called PCV7, or to any vaccine containing diphtheria toxoid (for example, DTaP), should not get PCV13.  Anyone with a severe allergy to any component of PCV13 should not get the vaccine.  Tell your doctor if the person being vaccinated has any severe allergies.  If the person scheduled for vaccination is not feeling well, your healthcare provider might decide to reschedule the shot on another day.   4. Risks of a vaccine reaction  With any medicine, including vaccines, there is a chance of reactions. These are usually mild and go away on their own, but serious reactions are also possible.   Problems reported following PCV13 varied by age and dose in the series. The most common problems reported among children were:  ; About half became drowsy after the shot, had a temporary loss of appetite, or had redness or tenderness where the shot was given. ; About 1 out of 3 had swelling where the shot was given. ; About 1 out of 3 had a mild fever, and about 1 in 20 had a fever over 102.36F. ; Up to about 8 out of 10 became fussy or irritable.   Adults have reported pain, redness, and swelling where the shot was given; also mild fever, fatigue, headache, chills, or muscle  pain.  Young children who get PCV13 along with inactivated flu vaccine at the same time may be at increased risk for seizures caused by fever. Ask your doctor for more information.   Problems that could happen after any vaccine:  ; People sometimes faint after a medical procedure, including vaccination. Sitting or lying down for about 15 minutes can help prevent  fainting, and injuries caused by a fall. Tell your doctor if you feel dizzy, or have vision changes or ringing in the ears.  ; Some older children and adults get severe pain in the shoulder and have difficulty moving the arm where a shot was given. This happens very rarely.  ; Any medication can cause a severe allergic reaction. Such reactions from a vaccine are very rare, estimated at about 1 in a million doses, and would happen within a few minutes to a few hours after the vaccination.   As with any medicine, there is a very small chance of a vaccine causing a serious injury or death.  The safety of vaccines is always being monitored.  For more information, visit: http://www.aguilar.org/   5. What if there is a serious reaction?  What should I look for?  ; Look for anything that concerns you, such as signs of a severe allergic reaction, very high fever, or unusual behavior.  Signs of a severe allergic reaction can include hives, swelling of the face and throat, difficulty breathing, a fast heartbeat, dizziness, and weakness - usually within a few minutes to a few hours after the vaccination.  What should I do?  ; If you think it is a severe allergic reaction or other emergency that can't wait, call 9-1-1 or get the person to the nearest hospital. Otherwise, call your doctor.  Reactions should be reported to the Vaccine Adverse Event Reporting System (VAERS). Your doctor should file this report, or you can do it yourself through the VAERS web site at www.vaers.SamedayNews.es, or by calling 707-346-9343.  VAERS does not give medical advice.  6. The National Vaccine Injury Compensation Program  The [employer] Vaccine Injury Compensation Program (VICP) is a federal program that was created to compensate people who may have been injured by certain vaccines.  Persons who believe they may have been injured by a vaccine can learn about the program and about filing a claim by calling  [phone] or visiting the [place] website at GoldCloset.com.ee.  There is a time limit to file a claim for compensation.

## 2018-07-29 LAB — IRON,TIBC AND FERRITIN PANEL
%SAT: 30 % (calc) (ref 20–48)
Ferritin: 132 ng/mL (ref 24–380)
Iron: 87 ug/dL (ref 50–180)
TIBC: 291 mcg/dL (calc) (ref 250–425)

## 2018-08-01 ENCOUNTER — Other Ambulatory Visit: Payer: Self-pay | Admitting: Family Medicine

## 2018-08-27 ENCOUNTER — Ambulatory Visit (INDEPENDENT_AMBULATORY_CARE_PROVIDER_SITE_OTHER): Payer: Medicare Other | Admitting: General Practice

## 2018-08-27 DIAGNOSIS — Z7901 Long term (current) use of anticoagulants: Secondary | ICD-10-CM

## 2018-08-27 LAB — POCT INR: INR: 2.3 (ref 2.0–3.0)

## 2018-08-27 NOTE — Patient Instructions (Signed)
Pre visit review using our clinic review tool, if applicable. No additional management support is needed unless otherwise documented below in the visit note.  Continue to take 1 tablet daily.  Re-check in 6 weeks.

## 2018-08-27 NOTE — Progress Notes (Signed)
I have reviewed this visit and I agree on the patient's plan of dosage and recommendations. Carlesha Seiple, DO   

## 2018-09-21 ENCOUNTER — Other Ambulatory Visit: Payer: Self-pay | Admitting: Cardiovascular Disease

## 2018-09-22 ENCOUNTER — Telehealth: Payer: Self-pay | Admitting: Family Medicine

## 2018-09-22 DIAGNOSIS — M2550 Pain in unspecified joint: Secondary | ICD-10-CM

## 2018-09-22 MED ORDER — OXYCODONE-ACETAMINOPHEN 5-325 MG PO TABS: 1.0000 | ORAL_TABLET | Freq: Two times a day (BID) | ORAL | 0 refills | Status: DC | PRN

## 2018-09-22 NOTE — Addendum Note (Signed)
Addended by: Briscoe Deutscher R on: 09/22/2018 05:56 PM   Modules accepted: Orders

## 2018-09-22 NOTE — Telephone Encounter (Signed)
MEDICATION:  diltiazem (CARTIA XT) 180 MG 24 hr capsule Percocet 5325  PHARMACY:   [facility], [place] [phone] (Phone) [phone] (Fax)     IS THIS A 90 DAY SUPPLY : N/A   IS PATIENT OUT OF MEDICATION: N for both  IF NOT; HOW MUCH IS LEFT: Pt stated he does not believe he will have enough to make it through the holidays but is not out of either one yet.   LAST APPOINTMENT DATE: @10 /01/2018  NEXT APPOINTMENT DATE:@12 /08/2018  OTHER COMMENTS:    **Let patient know to contact pharmacy at the end of the day to make sure medication is ready. **  ** Please notify patient to allow 48-72 hours to process**  **Encourage patient to contact the pharmacy for refills or they can request refills through [facility]**

## 2018-09-22 NOTE — Telephone Encounter (Signed)
Completed.

## 2018-09-22 NOTE — Telephone Encounter (Signed)
Dr. [name], please see message. Pt requesting Percocet. Diltiazem was filled by Cardiologist.

## 2018-09-23 NOTE — Telephone Encounter (Signed)
Left message on voicemail that Rx was sent to pharmacy.  

## 2018-10-08 ENCOUNTER — Ambulatory Visit (INDEPENDENT_AMBULATORY_CARE_PROVIDER_SITE_OTHER): Payer: Medicare Other | Admitting: General Practice

## 2018-10-08 DIAGNOSIS — Z7901 Long term (current) use of anticoagulants: Secondary | ICD-10-CM | POA: Diagnosis not present

## 2018-10-08 LAB — POCT INR: INR: 3.4 — AB (ref 2.0–3.0)

## 2018-10-08 NOTE — Patient Instructions (Addendum)
Pre visit review using our clinic review tool, if applicable. No additional management support is needed unless otherwise documented below in the visit note.  Hold coumadin today (12/11) and then continue to take 1 tablet daily.  Re-check in 3 to 4 weeks.

## 2018-10-08 NOTE — Progress Notes (Signed)
I have reviewed this visit and I agree on the patient's plan of dosage and recommendations. Corynne Scibilia, DO   

## 2018-10-19 ENCOUNTER — Other Ambulatory Visit: Payer: Self-pay | Admitting: Cardiovascular Disease

## 2018-10-22 ENCOUNTER — Other Ambulatory Visit: Payer: Self-pay | Admitting: Family Medicine

## 2018-10-26 ENCOUNTER — Other Ambulatory Visit: Payer: Self-pay | Admitting: Family Medicine

## 2018-10-31 ENCOUNTER — Ambulatory Visit (INDEPENDENT_AMBULATORY_CARE_PROVIDER_SITE_OTHER): Payer: Medicare Other | Admitting: Family Medicine

## 2018-10-31 ENCOUNTER — Encounter: Payer: Self-pay | Admitting: Family Medicine

## 2018-10-31 VITALS — BP 120/68 | HR 50 | Temp 97.6°F | Wt 192.2 lb

## 2018-10-31 DIAGNOSIS — I4819 Other persistent atrial fibrillation: Secondary | ICD-10-CM

## 2018-10-31 DIAGNOSIS — G3281 Cerebellar ataxia in diseases classified elsewhere: Secondary | ICD-10-CM

## 2018-10-31 DIAGNOSIS — M2550 Pain in unspecified joint: Secondary | ICD-10-CM | POA: Diagnosis not present

## 2018-10-31 DIAGNOSIS — I509 Heart failure, unspecified: Secondary | ICD-10-CM

## 2018-10-31 MED ORDER — OXYCODONE-ACETAMINOPHEN 5-325 MG PO TABS: 1.0000 | ORAL_TABLET | Freq: Two times a day (BID) | ORAL | 0 refills | Status: DC | PRN

## 2018-10-31 NOTE — Progress Notes (Signed)
Marquies Wanat is a 78 y.o. male is here for follow up.  History of Present Illness:   HPI:   1. Arthralgia of multiple joints, controlled with 1.5 Percocet daily. Tolerating medications, no side effects. Trying to walk daily.    2. Congestive heart failure, NYHA class 2, unspecified congestive heart failure type ([place]). Gets winded when walking too fast or too far. No CP, edema. Compliant with medication, without side effects.    3. Persistent atrial fibrillation ([place]), on Coumadin.    4. 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 Daily and persistent. Declines referral to see Neurology to discuss options.     Review of Systems   Pertinent items are noted in the HPI. Otherwise, a complete ROS is negative.  Vitals:   Vitals:   10/31/18 1133  BP: 120/68  Pulse: (!) 50  Temp: 97.6 F (36.4 C)  TempSrc: Oral  SpO2: 98%  Weight: 192 lb 3.2 oz (87.2 kg)     Body mass index is 28.38 kg/m.  Physical Exam:   Physical Exam Vitals signs and nursing note reviewed.  Constitutional:      General: He is not in acute distress.    Appearance: He is well-developed.  HENT:     Head: Normocephalic and atraumatic.     Right Ear: External ear normal.     Left Ear: External ear normal.     Nose: Nose normal.  Eyes:     Conjunctiva/sclera: Conjunctivae normal.     Pupils: Pupils are equal, round, and reactive to light.  Neck:     Musculoskeletal: Normal range of motion and neck supple.  Cardiovascular:     Rate and Rhythm: Normal rate. Rhythm irregularly irregular.     Heart sounds: Murmur present.  Pulmonary:     Effort: Pulmonary effort is normal.     Breath sounds: Normal breath sounds.  Abdominal:     General: Bowel sounds are normal.     Palpations: Abdomen is soft.  Musculoskeletal: Normal range of motion.  Skin:    General: Skin is warm and dry.  Neurological:     Mental Status: He is alert and oriented to person, place, and time.    Psychiatric:        Behavior: Behavior normal.        Thought Content: Thought content normal.        Judgment: Judgment normal.     Results for orders placed or performed in visit on 10/08/18  POCT INR  Result Value Ref Range   INR 3.4 (A) 2.0 - 3.0    Assessment and Plan:   Olon was seen today for follow-up.  Diagnoses and all orders for this visit:  Arthralgia of multiple joints, controlled with 1.5 Percocet daily -     oxyCODONE-acetaminophen (PERCOCET/ROXICET) 5-325 MG tablet; Take 1 tablet by mouth 2 (two) times daily as needed for severe pain.  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 Orders and follow up as documented in [place], reviewed diet, exercise and weight control, cardiovascular risk and specific lipid/LDL goals reviewed, reviewed medications and side effects in detail.  . Reviewed expectations re: course of current medical issues. . Outlined signs and symptoms indicating need for more acute intervention. . Patient verbalized understanding and all questions were answered. . Patient received an After  Visit Summary.  Briscoe Deutscher, DO [place] 11/02/2018

## 2018-11-05 ENCOUNTER — Ambulatory Visit (INDEPENDENT_AMBULATORY_CARE_PROVIDER_SITE_OTHER): Payer: Medicare Other | Admitting: General Practice

## 2018-11-05 DIAGNOSIS — Z7901 Long term (current) use of anticoagulants: Secondary | ICD-10-CM

## 2018-11-05 LAB — POCT INR: INR: 4 — AB (ref 2.0–3.0)

## 2018-11-05 NOTE — Patient Instructions (Addendum)
Pre visit review using our clinic review tool, if applicable. No additional management support is needed unless otherwise documented below in the visit note.  Hold coumadin today and tomorrow and then decrease dosage and take 1 tablet daily except take 1/2 tablet every Wednesday.  Re-check in 3 weeks.

## 2018-11-06 NOTE — Progress Notes (Signed)
I have reviewed this visit and I agree on the patient's plan of dosage and recommendations. Tiondra Fang, DO   

## 2018-11-28 ENCOUNTER — Other Ambulatory Visit: Payer: Self-pay | Admitting: Family Medicine

## 2018-11-28 DIAGNOSIS — M2550 Pain in unspecified joint: Secondary | ICD-10-CM

## 2018-11-28 NOTE — Telephone Encounter (Signed)
See note

## 2018-11-28 NOTE — Telephone Encounter (Signed)
Copied from [place] [id number]. Topic: Quick Communication - Rx Refill/Question >> Nov 28, 2018  4:36 PM Keene Breath wrote: Medication: diltiazem (CARTIA XT) 180 MG 24 hr capsule / oxyCODONE-acetaminophen (PERCOCET/ROXICET) 5-325 MG tablet  Patient called to request a refill for the above medications  Preferred Pharmacy (with phone number or street name): [place], [place] [phone] (Phone) [phone] (Fax)

## 2018-11-30 MED ORDER — OXYCODONE-ACETAMINOPHEN 5-325 MG PO TABS: 1.0000 | ORAL_TABLET | Freq: Two times a day (BID) | ORAL | 0 refills | Status: DC | PRN

## 2018-11-30 MED ORDER — DILTIAZEM HCL ER COATED BEADS 180 MG PO CP24: 180.0000 mg | ORAL_CAPSULE | Freq: Every day | ORAL | 1 refills | Status: DC

## 2018-12-03 ENCOUNTER — Ambulatory Visit (INDEPENDENT_AMBULATORY_CARE_PROVIDER_SITE_OTHER): Payer: Medicare Other | Admitting: General Practice

## 2018-12-03 DIAGNOSIS — Z7901 Long term (current) use of anticoagulants: Secondary | ICD-10-CM

## 2018-12-03 LAB — POCT INR: INR: 3.3 — AB (ref 2.0–3.0)

## 2018-12-03 NOTE — Patient Instructions (Addendum)
Pre visit review using our clinic review tool, if applicable. No additional management support is needed unless otherwise documented below in the visit note.  Hold coumadin today (2/5) and then continue to take 1 tablet daily except 1/2 tablet every Wednesday.  Re-check in 4 weeks.

## 2018-12-04 NOTE — Progress Notes (Signed)
I have reviewed this visit and I agree on the patient's plan of dosage and recommendations. Adiel Mcnamara, DO   

## 2018-12-30 ENCOUNTER — Other Ambulatory Visit: Payer: Self-pay | Admitting: Family Medicine

## 2018-12-30 DIAGNOSIS — E781 Pure hyperglyceridemia: Secondary | ICD-10-CM

## 2018-12-31 ENCOUNTER — Ambulatory Visit (INDEPENDENT_AMBULATORY_CARE_PROVIDER_SITE_OTHER): Payer: Medicare Other | Admitting: General Practice

## 2018-12-31 ENCOUNTER — Telehealth: Payer: Self-pay | Admitting: Family Medicine

## 2018-12-31 DIAGNOSIS — Z7901 Long term (current) use of anticoagulants: Secondary | ICD-10-CM | POA: Diagnosis not present

## 2018-12-31 DIAGNOSIS — M2550 Pain in unspecified joint: Secondary | ICD-10-CM

## 2018-12-31 LAB — POCT INR: INR: 3.1 — AB (ref 2.0–3.0)

## 2018-12-31 MED ORDER — OXYCODONE-ACETAMINOPHEN 5-325 MG PO TABS: 1.0000 | ORAL_TABLET | Freq: Two times a day (BID) | ORAL | 0 refills | Status: DC | PRN

## 2018-12-31 NOTE — Patient Instructions (Addendum)
Pre visit review using our clinic review tool, if applicable. No additional management support is needed unless otherwise documented below in the visit note.   Take 1 tablet daily except 1/2 tablet every Wednesday and Saturday.   Re-check in 4 weeks.  

## 2018-12-31 NOTE — Telephone Encounter (Signed)
MEDICATION:  oxyCODONE-acetaminophen (PERCOCET/ROXICET) 5-325 MG tablet  PHARMACY:   [facility], [place] [phone] (Phone) [phone] (Fax)    IS THIS A 90 DAY SUPPLY :   IS PATIENT OUT OF MEDICATION: N  IF NOT; HOW MUCH IS LEFT: enough for about a week   LAST APPOINTMENT DATE: @3 /12/2018  NEXT APPOINTMENT DATE:@4 /10/2018  OTHER COMMENTS:    **Let patient know to contact pharmacy at the end of the day to make sure medication is ready. **  ** Please notify patient to allow 48-72 hours to process**  **Encourage patient to contact the pharmacy for refills or they can request refills through [facility]**

## 2018-12-31 NOTE — Telephone Encounter (Signed)
Pt was curious as to what "blood type" he is. Is there a test that needs to be done for this? Please advise.

## 2018-12-31 NOTE — Progress Notes (Signed)
I have reviewed this visit and I agree on the patient's plan of dosage and recommendations. Xai Frerking, DO   

## 2018-12-31 NOTE — Addendum Note (Signed)
Addended by: Briscoe Deutscher R on: 12/31/2018 03:42 PM   Modules accepted: Orders

## 2019-01-01 NOTE — Telephone Encounter (Signed)
Any idea where I can look in chart to see if we have that?

## 2019-01-02 NOTE — Telephone Encounter (Signed)
Called patient and let  Him know.

## 2019-01-02 NOTE — Telephone Encounter (Signed)
He can donate blood and find out. Not routine to check outpatient.

## 2019-01-17 ENCOUNTER — Other Ambulatory Visit: Payer: Self-pay | Admitting: Family Medicine

## 2019-01-21 ENCOUNTER — Other Ambulatory Visit: Payer: Self-pay | Admitting: Family Medicine

## 2019-01-28 ENCOUNTER — Ambulatory Visit (INDEPENDENT_AMBULATORY_CARE_PROVIDER_SITE_OTHER): Payer: Medicare Other | Admitting: General Practice

## 2019-01-28 ENCOUNTER — Other Ambulatory Visit: Payer: Self-pay

## 2019-01-28 ENCOUNTER — Other Ambulatory Visit: Payer: Self-pay | Admitting: Family Medicine

## 2019-01-28 DIAGNOSIS — Z7901 Long term (current) use of anticoagulants: Secondary | ICD-10-CM | POA: Diagnosis not present

## 2019-01-28 LAB — POCT INR: INR: 2.4 (ref 2.0–3.0)

## 2019-01-28 NOTE — Patient Instructions (Signed)
Pre visit review using our clinic review tool, if applicable. No additional management support is needed unless otherwise documented below in the visit note.   Take 1 tablet daily except 1/2 tablet every Wednesday and Saturday.   Re-check in 4 weeks.

## 2019-01-28 NOTE — Progress Notes (Signed)
I have reviewed this visit and I agree on the patient's plan of dosage and recommendations. Baby Gieger, DO   

## 2019-02-21 ENCOUNTER — Other Ambulatory Visit: Payer: Self-pay | Admitting: Cardiovascular Disease

## 2019-02-23 ENCOUNTER — Telehealth: Payer: Self-pay | Admitting: Cardiovascular Disease

## 2019-02-23 NOTE — Telephone Encounter (Signed)
LVM to call and schedule appointment within 30 days.  Has not been seen since 2018.

## 2019-02-23 NOTE — Telephone Encounter (Signed)
Cartia XT 180 refilled.

## 2019-02-25 ENCOUNTER — Other Ambulatory Visit: Payer: Self-pay

## 2019-02-25 ENCOUNTER — Ambulatory Visit (INDEPENDENT_AMBULATORY_CARE_PROVIDER_SITE_OTHER): Payer: Medicare Other | Admitting: General Practice

## 2019-02-25 DIAGNOSIS — Z7901 Long term (current) use of anticoagulants: Secondary | ICD-10-CM | POA: Diagnosis not present

## 2019-02-25 LAB — POCT INR: INR: 3.1 — AB (ref 2.0–3.0)

## 2019-02-25 NOTE — Patient Instructions (Signed)
Pre visit review using our clinic review tool, if applicable. No additional management support is needed unless otherwise documented below in the visit note.  Skip coumadin today (4/29) and take 1 tablet daily except 1/2 tablet every Wednesday and Saturday.   Re-check in 4 weeks.

## 2019-02-25 NOTE — Progress Notes (Signed)
I have reviewed this visit and I agree on the patient's plan of dosage and recommendations. Matheo Rathbone, DO   

## 2019-03-11 ENCOUNTER — Ambulatory Visit: Payer: Medicare Other | Admitting: Neurology

## 2019-03-17 ENCOUNTER — Ambulatory Visit: Payer: Medicare Other | Admitting: Neurology

## 2019-03-19 ENCOUNTER — Telehealth: Payer: Self-pay | Admitting: Neurology

## 2019-03-19 NOTE — Telephone Encounter (Signed)
Patient is calling in about Medication: cartia, warfarin, tamsulosin, omeprazole, or other medication listed. He isn't sure which ones Dr. Tomi Likens prescribes because he is needing refills. Can you help him? Please call him back at [phone]. Thanks!

## 2019-03-20 NOTE — Telephone Encounter (Signed)
Called and spoke with Pt. He does not have any medications filled by [place]. He will keep upcoming appt.

## 2019-03-25 ENCOUNTER — Other Ambulatory Visit: Payer: Self-pay

## 2019-03-25 ENCOUNTER — Ambulatory Visit (INDEPENDENT_AMBULATORY_CARE_PROVIDER_SITE_OTHER): Payer: Medicare Other | Admitting: General Practice

## 2019-03-25 DIAGNOSIS — Z7901 Long term (current) use of anticoagulants: Secondary | ICD-10-CM | POA: Diagnosis not present

## 2019-03-25 LAB — POCT INR: INR: 3.2 — AB (ref 2.0–3.0)

## 2019-03-25 NOTE — Patient Instructions (Addendum)
Pre visit review using our clinic review tool, if applicable. No additional management support is needed unless otherwise documented below in the visit note.  Skip coumadin today (5/27) and then change dosage and take 1 tablet daily except 1/2 tablet every Monday Wednesday and Saturday.   Re-check in 4 weeks.

## 2019-03-25 NOTE — Progress Notes (Signed)
I have reviewed this visit and I agree on the patient's plan of dosage and recommendations. Harshitha Fretz, DO   

## 2019-04-08 ENCOUNTER — Telehealth: Payer: Self-pay | Admitting: Family Medicine

## 2019-04-08 NOTE — Telephone Encounter (Signed)
Pt came into office on 04/08/19 and requested to change providers. Pt says he is not satisfied with Dr. [name] as his primary care physician and would like to switch to Dr. Jerline Pain or another provider in our office, but says he will change offices altogether if needed. Is it ok to transfer care from Dr.Wallace to Dr. Jerline Pain, please advise. Thank you!

## 2019-04-09 NOTE — Telephone Encounter (Signed)
Please advise 

## 2019-04-09 NOTE — Telephone Encounter (Signed)
FYI

## 2019-04-09 NOTE — Telephone Encounter (Signed)
Unable to accept transfer at this time.  Algis Greenhouse. Jerline Pain, MD 04/09/2019 1:02 PM

## 2019-04-10 ENCOUNTER — Telehealth: Payer: Self-pay | Admitting: Family Medicine

## 2019-04-10 NOTE — Telephone Encounter (Signed)
Pt came into office on 04/08/19 and requested to change providers. Pt would like to transfer care from Dr. [name] to another provider in our office, but says he will change offices altogether if needed. Would any of you allow this patient to transfer care over to you?, please advise. Thank you!

## 2019-04-10 NOTE — Telephone Encounter (Signed)
Please see message and advise 

## 2019-04-10 NOTE — Telephone Encounter (Signed)
I do not do long term pain medication management. No for me.

## 2019-04-10 NOTE — Telephone Encounter (Signed)
Forwarding to Dr. Hunter.  

## 2019-04-10 NOTE — Telephone Encounter (Signed)
See below

## 2019-04-11 NOTE — Telephone Encounter (Signed)
No for me

## 2019-04-13 NOTE — Telephone Encounter (Signed)
No thank you.  Do we have a policy on patient's requesting to transfer between providers at our office?

## 2019-04-13 NOTE — Telephone Encounter (Signed)
Called pt to let him know that none of our providers are able to accept him as a patient of theirs and he will need to contact another office about transferring care from Dr. [name]. No answer, LVM.

## 2019-04-13 NOTE — Telephone Encounter (Signed)
Please contact patient and advise of updates. Also, Dr. [name] needs to be made aware of the transfer request.

## 2019-04-14 ENCOUNTER — Telehealth: Payer: Self-pay | Admitting: Family Medicine

## 2019-04-14 NOTE — Telephone Encounter (Signed)
Marcus Galloway had requested to transfer care from Dr. [name] to another provider within our office if possible. I called and spoke with Marcus Galloway on 04/14/19 to inform him that no provider here at our office will be able to accept him as a Marcus Galloway and he will need to contact another office in order to find another provider. Marcus Galloway stated he would like to continue to see Jenny Reichmann for coumadin if possible.

## 2019-04-16 ENCOUNTER — Other Ambulatory Visit: Payer: Self-pay | Admitting: Family Medicine

## 2019-04-17 ENCOUNTER — Other Ambulatory Visit: Payer: Self-pay | Admitting: Family Medicine

## 2019-04-17 NOTE — Telephone Encounter (Signed)
Rx request Last fill 10/23/18  #100/1 Last OV 10/31/18

## 2019-04-21 ENCOUNTER — Other Ambulatory Visit: Payer: Self-pay | Admitting: Cardiovascular Disease

## 2019-04-22 ENCOUNTER — Ambulatory Visit: Payer: Medicare Other

## 2019-04-29 ENCOUNTER — Other Ambulatory Visit: Payer: Self-pay

## 2019-04-29 ENCOUNTER — Telehealth: Payer: Self-pay

## 2019-04-29 ENCOUNTER — Ambulatory Visit (INDEPENDENT_AMBULATORY_CARE_PROVIDER_SITE_OTHER): Payer: Medicare Other | Admitting: General Practice

## 2019-04-29 DIAGNOSIS — Z7901 Long term (current) use of anticoagulants: Secondary | ICD-10-CM | POA: Diagnosis not present

## 2019-04-29 LAB — POCT INR: INR: 4.8 — AB (ref 2.0–3.0)

## 2019-04-29 NOTE — Telephone Encounter (Signed)
Copied from [place] [id number]. Topic: General - Other >> Apr 29, 2019 11:26 AM Lennox Solders wrote: Reason for CRM:  pt just saw cindy boyd and he would like cindy to return his call concerning a medication that was listed on avs sheet they he does not take

## 2019-04-29 NOTE — Patient Instructions (Addendum)
Pre visit review using our clinic review tool, if applicable. No additional management support is needed unless otherwise documented below in the visit note.  Skip coumadin today (7/1) and tomorrow (7/2) then change dosage and take 1 tablet daily except 1/2 tablet every Monday Wednesday and Saturday.   Re-check in 2 weeks.  Advised patient not to use alcohol.

## 2019-05-13 ENCOUNTER — Other Ambulatory Visit: Payer: Self-pay | Admitting: General Practice

## 2019-05-13 ENCOUNTER — Other Ambulatory Visit: Payer: Self-pay

## 2019-05-13 ENCOUNTER — Ambulatory Visit (INDEPENDENT_AMBULATORY_CARE_PROVIDER_SITE_OTHER): Payer: Medicare Other | Admitting: General Practice

## 2019-05-13 DIAGNOSIS — Z7901 Long term (current) use of anticoagulants: Secondary | ICD-10-CM

## 2019-05-13 LAB — POCT INR: INR: 2 (ref 2.0–3.0)

## 2019-05-13 NOTE — Progress Notes (Addendum)
Virtual Visit via Telephone Note The purpose of this virtual visit is to provide medical care while limiting exposure to the novel coronavirus.    Consent was obtained for phone visit:  Yes.   Answered questions that patient had about telehealth interaction:  Yes.   I discussed the limitations, risks, security and privacy concerns of performing an evaluation and management service by telephone. I also discussed with the patient that there may be a patient responsible charge related to this service. The patient expressed understanding and agreed to proceed.  Pt location: Home Physician Location: office Name of referring provider:  Briscoe Deutscher, DO I connected with .Marcus Galloway at patients initiation/request on 05/14/2019 at  3:10 PM EDT by telephone and verified that I am speaking with the correct person using two identifiers.  Pt MRN:  [id number] Pt DOB:  1941/08/23   History of Present Illness:  Marcus Galloway is a 78 year old right handed male with persistent atrial fibrillation, CHF, chronic neck and back pain and history of polio who follows up for dizziness and cerebral amyloid angiopathy.  He is accompanied by his daughter who supplements history.  UPDATE: Since last year, he restarted drinking alcohol.  He states he has 2 glasses of wine every evening and an occasional bourbon.  He lives alone in his own apartment.  He says he is independent.  He pays his own bills and does his own shopping.  He says he nods off asleep 2 to 3 times a day.  He has three daughters that all live nearby.  He no longer takes Percocet for chronic neck and back pain.     HISTORY: He has history of dizziness and unsteady gait for about 7 years.  He reports a constant dizziness described as a spinning sensation that fluctuates and is worse with standing up or bending over.  It may last a couple of minutes.  There is no associated double vision, slurred speech, trouble swallowing, facial numbness or  hearing loss.  He notes history of tinnitus.  Sometimes he may feel nauseous.  Nothing really makes it better except for keeping still.  He has undergone vestibular rehabilitation which was initially helpful but then was ultimately ineffective.  Over the past few years, he reports increased difficulty with concentration and performing ADLs such as paying bills.  He reports short-term memory deficits.  MRI of brain from 09/05/17 was personally reviewed and revealed chronic small vessel disease but also chronic microhemorrhages in the cerebral white matter, in pattern consistent with cerebral amyloid angiopathy.  There are very few microhemorrhages in the cerebellum as well.    He takes Tylenol for chronic neck and back pain.  He drinks bourbon/3 to 4 cocktails daily.  He had polio as a child.  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    Observations/Objective:   Height 5\' 9"  (1.753 m), weight 180 lb (81.6 kg). No acute distress.   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  Initiate Aricept 5mg  daily for one month, then increase to 10mg  daily. 2.  Advised to limit driving only during daylight hours and locally to familiar places such as the grocery store. 3.  Follow up in 9 months.   Follow Up Instructions:    -I discussed the assessment and treatment plan  with the patient. The patient was provided an opportunity to ask questions and all were answered. The patient agreed with the plan and demonstrated an understanding of the instructions.   The patient was advised to call back or seek an in-person evaluation if the symptoms worsen or if the condition fails to improve as anticipated.    Total Time spent in visit with the patient was:  23 minutes.   Dudley Major, DO

## 2019-05-13 NOTE — Patient Instructions (Addendum)
Pre visit review using our clinic review tool, if applicable. No additional management support is needed unless otherwise documented below in the visit note.  Continue to take 1 tablet daily except 1/2 tablet every Monday Wednesday and Saturday.   Re-check in 4 weeks.  Advised patient not to use alcohol.

## 2019-05-14 ENCOUNTER — Telehealth (INDEPENDENT_AMBULATORY_CARE_PROVIDER_SITE_OTHER): Payer: Medicare Other | Admitting: Neurology

## 2019-05-14 ENCOUNTER — Encounter: Payer: Self-pay | Admitting: Neurology

## 2019-05-14 VITALS — Ht 69.0 in | Wt 180.0 lb

## 2019-05-14 DIAGNOSIS — R4189 Other symptoms and signs involving cognitive functions and awareness: Secondary | ICD-10-CM

## 2019-05-14 DIAGNOSIS — F1020 Alcohol dependence, uncomplicated: Secondary | ICD-10-CM

## 2019-05-14 DIAGNOSIS — I680 Cerebral amyloid angiopathy: Secondary | ICD-10-CM

## 2019-05-14 MED ORDER — DONEPEZIL HCL 5 MG PO TABS: 5.0000 mg | ORAL_TABLET | Freq: Every day | ORAL | 0 refills | Status: DC

## 2019-05-14 NOTE — Patient Instructions (Signed)
We will start donepezil (Aricept) 5mg  daily for four weeks.  If you are tolerating the medication, then after four weeks, we will increase the dose to 10mg  daily.  Side effects include nausea, vomiting, diarrhea, vivid dreams, and muscle cramps.  Please call the clinic if you experience any of these symptoms.  Follow up in 9 months.

## 2019-05-17 ENCOUNTER — Other Ambulatory Visit: Payer: Self-pay | Admitting: Cardiovascular Disease

## 2019-05-20 ENCOUNTER — Telehealth: Payer: Self-pay | Admitting: Neurology

## 2019-05-20 NOTE — Telephone Encounter (Signed)
Daughter is calling in wanting to speak with you. She said that patient is getting confused about medication and when she ask questions so she is wanting to touch base. Thanks!

## 2019-05-20 NOTE — Telephone Encounter (Signed)
Called and spoke with [name]. I explained to her I am unable to discuss PHI with her.  She will have her sister Marcus Galloway call, who is listed on the [facility]. They have concerns about the new medications for dementia and if Pt is taking it correctly and if he has told them all they should know.

## 2019-05-25 ENCOUNTER — Telehealth: Payer: Self-pay | Admitting: Neurology

## 2019-05-25 DIAGNOSIS — R55 Syncope and collapse: Secondary | ICD-10-CM

## 2019-05-25 NOTE — Telephone Encounter (Signed)
DAUGHTER LEFT VM AND IS CALLING IN WANTING  TO SPEAK WITH A NURSE ABOUT HER FATHER'S HEALTH.

## 2019-05-26 NOTE — Telephone Encounter (Signed)
Called patient's daughter back Loretha Brasil who is on [place]). She has concerns that at her dad's most recent telehealth visit with MD on 7/16 that her father did not disclose all of his "blackouts". Family is concerned as these have occurred when he was in the car (fortunately not when driving the car) but has had to pull over when he feels it coming on. Another time he passed out in the bank drive through and another person had to come knock on the door to wake him up. Patient told family he was given permission to drive short distances. I told daughter this was correct per MD note "Advised to limit driving only during daylight hours and locally to familiar places such as the grocery store".   Family is not sure how many blackouts he has but several times a week. They do not want him to drive anymore for safety reasons and wanted MD to be aware of the blackouts and see if there is a new driving recommendation.

## 2019-05-26 NOTE — Telephone Encounter (Signed)
Spoke with daughter Tressie Ellis. She said the blackouts started after 2018 (last MRI) and he told his family about it earlier this year probably Jan/Feb (pre - Covid she remembered as it was face to face).   Informed patient per Dr. Tomi Likens that if he is having blackouts he is NOT to drive. She is in agreement and will inform her dad of that. Informed patient of the EEG and MRI to be ordered and they will be called regarding those appts. She is going to make her father aware today so he will know about it.   Order placed for MRI and EEG.

## 2019-05-26 NOTE — Telephone Encounter (Signed)
If he is having black outs, then he cannot drive.  How long has he been having black outs?  To further evaluate, I would like to get EEG.  He had an MRI in 2018.  If these started after that, then I would repeat MRI of brain without contrast.

## 2019-06-02 ENCOUNTER — Telehealth: Payer: Self-pay

## 2019-06-02 NOTE — Telephone Encounter (Signed)
Yes

## 2019-06-02 NOTE — Telephone Encounter (Signed)
Appointment scheduled.

## 2019-06-02 NOTE — Telephone Encounter (Signed)
Pt is requesting to transfer care to a [place] provider. Dr. [name] has already agreed to transfer. Pt states he no longer takes any controlled rx's. Only maintenance meds.   Dr. Jerilee Hoh - will you accept pt for transfer of care? Thanks!

## 2019-06-05 ENCOUNTER — Other Ambulatory Visit: Payer: Self-pay

## 2019-06-05 ENCOUNTER — Encounter: Payer: Self-pay | Admitting: Internal Medicine

## 2019-06-05 ENCOUNTER — Ambulatory Visit (INDEPENDENT_AMBULATORY_CARE_PROVIDER_SITE_OTHER): Payer: Medicare Other | Admitting: Internal Medicine

## 2019-06-05 VITALS — BP 130/80 | HR 66 | Temp 98.0°F | Ht 69.0 in | Wt 186.1 lb

## 2019-06-05 DIAGNOSIS — K219 Gastro-esophageal reflux disease without esophagitis: Secondary | ICD-10-CM

## 2019-06-05 DIAGNOSIS — Z7901 Long term (current) use of anticoagulants: Secondary | ICD-10-CM

## 2019-06-05 DIAGNOSIS — N401 Enlarged prostate with lower urinary tract symptoms: Secondary | ICD-10-CM

## 2019-06-05 DIAGNOSIS — I4819 Other persistent atrial fibrillation: Secondary | ICD-10-CM | POA: Diagnosis not present

## 2019-06-05 DIAGNOSIS — N138 Other obstructive and reflux uropathy: Secondary | ICD-10-CM

## 2019-06-05 DIAGNOSIS — E854 Organ-limited amyloidosis: Secondary | ICD-10-CM

## 2019-06-05 DIAGNOSIS — I10 Essential (primary) hypertension: Secondary | ICD-10-CM | POA: Diagnosis not present

## 2019-06-05 DIAGNOSIS — I680 Cerebral amyloid angiopathy: Secondary | ICD-10-CM

## 2019-06-05 DIAGNOSIS — I509 Heart failure, unspecified: Secondary | ICD-10-CM | POA: Diagnosis not present

## 2019-06-05 DIAGNOSIS — E785 Hyperlipidemia, unspecified: Secondary | ICD-10-CM

## 2019-06-05 DIAGNOSIS — C61 Malignant neoplasm of prostate: Secondary | ICD-10-CM

## 2019-06-05 NOTE — Progress Notes (Signed)
Established Patient Office Visit     CC/Reason for Visit: Establish care, discuss chronic conditions.  HPI: Marcus Galloway is a 78 y.o. male who is coming in today for the above mentioned reasons.  He is transferring care from Dr. [name] at the [facility] location for unknown reasons.  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  His cardiologist is Dr. Claiborne Billings.  He used to be on Percocet for his chronic neck pain but is no longer taking this.  He rambles on today and seems to have tangential thoughts of no significant importance.  He seems to perseverate on the fact that he and his brother were abandoned as infants by their parents and grew up in an orphanage.  He wants me to be sure of the slew of childhood illnesses that he had including polio, measles, malaria.   Past Medical/Surgical History: Past Medical History:  Diagnosis Date  . Aortic valve disease   . Atrial fibrillation ([place])   . BPH (benign prostatic hyperplasia)   . Chronic neck pain   . GERD (gastroesophageal reflux disease)   . History of colonic polyps   . Hyperlipidemia   . Hypertension   . Osteoarthritis   . Persistent atrial fibrillation   . Polio   . Prostate cancer [facility])     Past Surgical History:  Procedure Laterality Date  . 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  . CARDIOVERSION  09/2016  . HERNIA REPAIR  1990  . INSERTION PROSTATE RADIATION SEED  2002    Social History:  reports that he has quit smoking. He has a 90.00 pack-year smoking history. He has never used smokeless tobacco. He reports current alcohol use. He reports that he does not use drugs.  Allergies: No  Known Allergies  Family History:  Family History  Adopted: Yes  Family history unknown: Yes     Current Outpatient Medications:  .  atorvastatin (LIPITOR) 10 MG tablet, TAKE ONE TABLET BY MOUTH DAILY, Disp: 90 tablet, Rfl: 3 .  CARTIA XT 180 MG 24 hr capsule, TAKE ONE CAPSULE BY MOUTH DAILY, Disp: 30 capsule, Rfl: 0 .  donepezil (ARICEPT) 5 MG tablet, Take 1 tablet (5 mg total) by mouth at bedtime., Disp: 30 tablet, Rfl: 0 .  metoprolol tartrate (LOPRESSOR) 50 MG tablet, TAKE ONE TABLET BY MOUTH TWICE A DAY, Disp: 180 tablet, Rfl: 0 .  OMEPRAZOLE PO, Take 20 mg by mouth daily., Disp: , Rfl:  .  tamsulosin (FLOMAX) 0.4 MG CAPS capsule, TAKE ONE CAPSULE BY MOUTH DAILY, Disp: 90 capsule, Rfl: 1 .  warfarin (COUMADIN) 5 MG tablet, TAKE ONE TABLET BY MOUTH DAILY AS DIRECTED, Disp: 100 tablet, Rfl: 0  Review of Systems:  Constitutional: Denies fever, chills, diaphoresis, appetite change and fatigue.  HEENT: Denies photophobia, eye pain, redness, hearing loss, ear pain, congestion, sore throat, rhinorrhea, sneezing, mouth sores, trouble swallowing, neck pain, neck stiffness and tinnitus.   Respiratory: Denies SOB, DOE, cough, chest tightness,  and wheezing.   Cardiovascular: Denies chest pain, palpitations and leg swelling.  Gastrointestinal: Denies nausea, vomiting, abdominal pain, diarrhea, constipation, blood in stool and abdominal distention.  Genitourinary: Denies dysuria, urgency, frequency, hematuria, flank pain and difficulty urinating.  Endocrine: Denies: hot or cold  intolerance, sweats, changes in hair or nails, polyuria, polydipsia. Musculoskeletal: Denies myalgias, back pain, joint swelling, arthralgias and gait problem.  Skin: Denies pallor, rash and wound.  Neurological: Denies dizziness, seizures, syncope, weakness, light-headedness, numbness and headaches.  Hematological: Denies adenopathy. Easy bruising, personal or family bleeding history  Psychiatric/Behavioral: Denies  suicidal ideation, mood changes, confusion, nervousness, sleep disturbance and agitation    Physical Exam: Vitals:   06/05/19 1300  BP: 130/80  Pulse: 66  Temp: 98 F (36.7 C)  TempSrc: Temporal  SpO2: 96%  Weight: 186 lb 1.6 oz (84.4 kg)  Height: 5\' 9"  (1.753 m)    Body mass index is 27.48 kg/m.   Constitutional: NAD, calm, comfortable Eyes: PERRL, lids and conjunctivae normal, wears corrective lenses. ENMT: Mucous membranes are moist.  Respiratory: clear to auscultation bilaterally, no wheezing, no crackles. Normal respiratory effort. No accessory muscle use.  Cardiovascular: Irregular rhythm, no murmurs / rubs / gallops. No extremity edema. 2+ pedal pulses. No carotid bruits.  Abdomen: no tenderness, no masses palpated. No hepatosplenomegaly. Bowel sounds positive.  Musculoskeletal: no clubbing / cyanosis. No joint deformity upper and lower extremities. Good ROM, no contractures. Normal muscle tone.  Skin: no rashes, lesions, ulcers. No induration Neurologic: Grossly intact and nonfocal Psychiatric: Normal judgment and insight. Alert and oriented x 3. Normal mood.    Impression and Plan:  Essential hypertension  -Well-controlled on current regimen.  Persistent atrial fibrillation ([place]), on Coumadin  -Rate controlled on diltiazem, anticoagulated on Coumadin.  Congestive heart failure, NYHA class 2, unspecified congestive heart failure type ([place])  -Compensated.  Gastroesophageal reflux disease without esophagitis  -Well-controlled on PPI therapy.  BPH with obstruction/lower urinary tract symptoms, on Flomax -Symptoms are well controlled on Flomax.  Prostate cancer ([place])  -No longer has GU follow-up.  Long term (current) use of anticoagulants [Z79.01], for persistent atrial fibrillation and valve replacement  -Coumadin clinic with Jenny Reichmann.  Hyperlipidemia, unspecified hyperlipidemia type -Last LDL was 56 in September 2019, continue Lipitor.  Cerebral amyloid  angiopathy ([place])  -Followed by neurology, recently started on Aricept. -He seems pretty confused, MMSE was 12 at neurology's office. -Wonder if he should drive and live independently?    Patient Instructions  -Nice meeting you today!!  -Schedule follow up appointment in 3 months for your physical. Please come in fasting that day.     Lelon Frohlich, MD [facility]

## 2019-06-05 NOTE — Patient Instructions (Signed)
-  Nice meeting you today!!  -Schedule follow up appointment in 3 months for your physical. Please come in fasting that day.

## 2019-06-10 ENCOUNTER — Ambulatory Visit (INDEPENDENT_AMBULATORY_CARE_PROVIDER_SITE_OTHER): Payer: Medicare Other | Admitting: General Practice

## 2019-06-10 ENCOUNTER — Other Ambulatory Visit: Payer: Self-pay

## 2019-06-10 DIAGNOSIS — Z7901 Long term (current) use of anticoagulants: Secondary | ICD-10-CM | POA: Diagnosis not present

## 2019-06-10 LAB — POCT INR: INR: 2.4 (ref 2.0–3.0)

## 2019-06-10 NOTE — Patient Instructions (Addendum)
Pre visit review using our clinic review tool, if applicable. No additional management support is needed unless otherwise documented below in the visit note.  Continue to take 1 tablet daily except 1/2 tablet every Monday Wednesday and Saturday.   Re-check in 4 weeks.  Advised patient not to use alcohol.

## 2019-06-11 ENCOUNTER — Ambulatory Visit (INDEPENDENT_AMBULATORY_CARE_PROVIDER_SITE_OTHER): Payer: Medicare Other | Admitting: Neurology

## 2019-06-11 DIAGNOSIS — R55 Syncope and collapse: Secondary | ICD-10-CM

## 2019-06-12 NOTE — Progress Notes (Signed)
I have reviewed this visit and I agree on the patient's plan of dosage and recommendations. Pio Eatherly, DO   

## 2019-06-12 NOTE — Procedures (Signed)
ELECTROENCEPHALOGRAM REPORT  Date of Study: 06/11/2019  Patient's Name: Marcus Galloway MRN: [id number] Date of Birth: 02/25/1941  Referring Provider: Metta Clines, DO  Clinical History: 78 year old man with black out spells  Medications: LIPITOR 10 MG tablet CARTIA XT 180 MG 24 hr capsule LOPRESSOR 50 MG tablet OMEPRAZOLE PO ROXICET 5-325 MG tablet FLOMAX 0.4 MG CAPS capsule COUMADIN 5 MG tablet  Technical Summary: A multichannel digital EEG recording measured by the international 10-20 system with electrodes applied with paste and impedances below 5000 ohms performed in our laboratory with EKG monitoring in an awake and drowsy patient.  Hyperventilation was not performed as patient wearing mask due to COVID pandemic.  Photic stimulation was performed.  The digital EEG was referentially recorded, reformatted, and digitally filtered in a variety of bipolar and referential montages for optimal display.    Description: The patient is awake and drowsy during the recording.  During maximal wakefulness, there is a symmetric, medium voltage 9 Hz posterior dominant rhythm that attenuates with eye opening.  The record is symmetric.  During drowsiness, there is an increase in theta slowing of the background.  Stage 2 sleep was not seen.  Photic stimulation did not elicit any abnormalities.  There were no epileptiform discharges or electrographic seizures seen.    EKG lead was unremarkable.  Impression: This awake and drowsy EEG is normal.    Clinical Correlation: A normal EEG does not exclude a clinical diagnosis of epilepsy.  If further clinical questions remain, prolonged EEG may be helpful.  Clinical correlation is advised.   Metta Clines, DO

## 2019-06-15 ENCOUNTER — Other Ambulatory Visit: Payer: Self-pay | Admitting: Cardiovascular Disease

## 2019-06-19 ENCOUNTER — Telehealth: Payer: Self-pay

## 2019-06-19 ENCOUNTER — Other Ambulatory Visit: Payer: Self-pay

## 2019-06-19 ENCOUNTER — Telehealth: Payer: Self-pay | Admitting: *Deleted

## 2019-06-19 DIAGNOSIS — R55 Syncope and collapse: Secondary | ICD-10-CM

## 2019-06-19 NOTE — Telephone Encounter (Signed)
No answer, will call back on results for EEG

## 2019-06-19 NOTE — Telephone Encounter (Signed)
Called to set up 24 hour amb EEG asked to call back to set this up.

## 2019-06-19 NOTE — Telephone Encounter (Signed)
-----   Message from Pieter Partridge, DO sent at 06/12/2019 12:40 PM EDT ----- Routine EEG is normal.  I would like to order a 24 hour ambulatory EEG to evaluate for blackout spells.

## 2019-07-14 ENCOUNTER — Ambulatory Visit (INDEPENDENT_AMBULATORY_CARE_PROVIDER_SITE_OTHER): Payer: Medicare Other | Admitting: Cardiovascular Disease

## 2019-07-14 ENCOUNTER — Other Ambulatory Visit: Payer: Self-pay

## 2019-07-14 ENCOUNTER — Encounter: Payer: Self-pay | Admitting: Cardiovascular Disease

## 2019-07-14 VITALS — BP 118/80 | HR 73 | Ht 69.0 in | Wt 183.2 lb

## 2019-07-14 DIAGNOSIS — Z952 Presence of prosthetic heart valve: Secondary | ICD-10-CM | POA: Diagnosis not present

## 2019-07-14 DIAGNOSIS — G14 Postpolio syndrome: Secondary | ICD-10-CM

## 2019-07-14 DIAGNOSIS — Z7901 Long term (current) use of anticoagulants: Secondary | ICD-10-CM | POA: Diagnosis not present

## 2019-07-14 DIAGNOSIS — E785 Hyperlipidemia, unspecified: Secondary | ICD-10-CM | POA: Diagnosis not present

## 2019-07-14 DIAGNOSIS — I4821 Permanent atrial fibrillation: Secondary | ICD-10-CM | POA: Diagnosis not present

## 2019-07-14 DIAGNOSIS — I10 Essential (primary) hypertension: Secondary | ICD-10-CM | POA: Diagnosis not present

## 2019-07-14 NOTE — Progress Notes (Signed)
 Cardiology Office Note    Date:  07/16/2019   ID:  Marcus Galloway, DOB 02/10/1941, MRN [id number]  PCP:  Hernandez Acosta, Estela Y, MD  Cardiologist:  Thomas Kelly, MD   No chief complaint on file.   History of Present Illness:  Marcus Galloway is a 78 y.o. male Caucasian male who moved to [place] from [place].  He established cardiology care with me in March 2018 and I last saw him in September 2018.  He presents for 2-year follow-up evaluation.  Mr. 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  He was found to be in atrial fibrillation on routine office visit in November 2017.  Rate control and anticoagulation were initiated at that time and ultimately underwent cardioversion.  He develop recurrent atrial fibrillation and underwent sotalol loading with repeat cardioversion.  An echo Doppler study had shown normal LV size and function with an EF of 50-55%, biatrial enlargement, mild-to-moderate MR, moderate TR, and a normal prosthetic aortic valve.  When last seen by his cardiologist, Dr. Gavin Leask in  [place] at [facility] on 12/10/2016 , he was back in atrial fibrillation.  At that time, eliquis was discontinued as was sotalol and he was started on metoprolol tartrate 75 mg twice a day and Coumadin for anticoagulation.  He has subsequently moved to [place] and establish care with me.    When I initially saw him, he denied He denies any episodes of chest pain.  He reportedly had undergone cardiac catheterization prior to his valve replacement and his coronary arteries were clean.  Recently, he had noticed exertional dyspnea.  He is unaware of rapid heartbeats on his current dose of metoprolol.  He admits to adequate sleep.  He is unaware of awakening gasping for breath, and he believes his sleep is restorative.  When I initially saw  him his ECG revealed atrial fibrillation with ventricular rate in the 80s.  I recommended discontinuance of amlodipine and change to diltiazem CD 180 mg for improved rate control as well.  Blood pressure control.  I recommended a follow-up echo Doppler study, and this was done on 02/01/2017.  This revealed an ejection fraction of 60-65%.  He had normal wall motion without regional wall motion abnormalities.  His bioprosthetic aortic valve was well-seated, without obstruction and only with trivial regurgitation. He had severe biatrial enlargement.  There was mild pulmonary hypertension at 36 mm with mild TR.    When I last saw him in September 2018 he had noticed some mild episodes of dizziness and also some vague chest wall discomfort.  He admitted to being sleepy during the day.  He typically goes to bed at 11 PM and wakes up at 7 AM.  Laboratory in May 2018 was stable.  His cholesterol was 151, LDL cholesterol 68, triglycerides 130, HDL 57.  He was taking atorvastatin 10 mg, metoprolol 75 mg twice a day, warfarin anticoagulation, Cartia XT 180 mg in addition to Flomax.  He was feeling significantly improved on diltiazem instead of amlodipine and I recommended slight reduction of metoprolol due to some transient episodes of mild dizziness.  Over the past several years he feels that he is been doing fairly well.  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  He remains free of chest pain.  He denies PND orthopnea.  He continues to be on warfarin without bleeding.  He continues to be on diltiazem 180 mg, metoprolol 50 mg twice a day and is on atorvastatin at 10 mg daily for hyperlipidemia.  He presents for evaluation.  Past Medical History:  Diagnosis Date  . Aortic valve disease   . Atrial fibrillation (HCC)   . BPH (benign prostatic hyperplasia)   . Chronic neck pain    . GERD (gastroesophageal reflux disease)   . History of colonic polyps   . Hyperlipidemia   . Hypertension   . Osteoarthritis   . Persistent atrial fibrillation   . Polio   . Prostate cancer (HCC)     Past Surgical History:  Procedure Laterality Date  . 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  . CARDIOVERSION  09/2016  . HERNIA REPAIR  1990  . INSERTION PROSTATE RADIATION SEED  2002    Current Medications: Outpatient Medications Prior to Visit  Medication Sig Dispense Refill  . atorvastatin (LIPITOR) 10 MG tablet TAKE ONE TABLET BY MOUTH DAILY 90 tablet 3  . diltiazem (CARTIA XT) 180 MG 24 hr capsule Take 1 capsule (180 mg total) by mouth daily. KEEP OV. 30 capsule 1  . OMEPRAZOLE PO Take 20 mg by mouth daily.    . tamsulosin (FLOMAX) 0.4 MG CAPS capsule TAKE ONE CAPSULE BY MOUTH DAILY 90 capsule 1  . warfarin (COUMADIN) 5 MG tablet TAKE ONE TABLET BY MOUTH DAILY AS DIRECTED 100 tablet 0  . metoprolol tartrate (LOPRESSOR) 50 MG tablet TAKE ONE TABLET BY MOUTH TWICE A DAY 180 tablet 0  . donepezil (ARICEPT) 5 MG tablet Take 1 tablet (5 mg total) by mouth at bedtime. 30 tablet 0   No facility-administered medications prior to visit.      Allergies:   Patient has no known allergies.   Social History   Socioeconomic History  . Marital status: Widowed    Spouse name: Not on file  . Number of children: 4  . Years of education: 12  . Highest education level: Not on file  Occupational History  . Occupation: Retired  Social Needs  . Financial resource strain: Not on file  . Food insecurity    Worry: Not on file    Inability: Not on file  . Transportation needs    Medical: Not on file    Non-medical: Not on file  Tobacco Use  . Smoking status: Former Smoker    Packs/day: 3.00    Years: 30.00    Pack years: 90.00  . Smokeless tobacco: Never Used  . Tobacco comment: quit smoking 1980  Substance and Sexual Activity  . Alcohol use: Yes    Comment:  quit Jan 2019  . Drug use: No  . Sexual activity: Not Currently  Lifestyle  . Physical activity    Days per week: Not on file    Minutes per session: Not on file  . Stress: Not on file  Relationships  . Social connections    Talks on phone: Not on file    Gets together: Not on file    Attends religious service: Not on file    Active member of club or organization: Not on file    Attends meetings of clubs or organizations: Not on file    Relationship status: Not on file  Other Topics Concern  . Not on file  Social History Narrative     Lives alone, recently moved back to [place] from [facility] to be closer to his children   Right-handed   Caffeine: 3 cups each morning    Social history is notable in that he is widowed.  He is moved back here to be close to his children.  He had smoked remotely from age 29 until age 50 but quit in 33.  He has 4 children.  One daughter had undergone aortic valve replacement.  He has a half-sister but her whereabouts is unknown.  He drinks occasional alcohol with wine or scotch.   Family History:  He was adopted. Family history is unknown by patient.  However, his biological parents are deceased and he was able to find through records that his mother died at age 29 and his father died at age 58.  ROS General: Negative; No fevers, chills, or night sweats;  HEENT: Negative; No changes in vision or hearing, sinus congestion, difficulty swallowing Pulmonary: Negative; No cough, wheezing, shortness of breath, hemoptysis Cardiovascular: see HPI GI: History of GERD on omeprazole GU: He has a history of prostate CA and underwent radiation seeds in 2001. Musculoskeletal: Negative; no myalgias, joint pain , or weakness/  he had polio at age 62-6 and was in the hospital for 5 months. Hematologic/Oncology: Negative; no easy bruising, bleeding Endocrine: Negative; no heat/cold intolerance; no diabetes Neuro: Negative; no changes in balance, headaches Skin: Negative; No rashes  or skin lesions Psychiatric: Negative; No behavioral problems, depression Sleep: Negative; No snoring, daytime sleepiness, hypersomnolence, bruxism, restless legs, hypnogognic hallucinations, no cataplexy Other comprehensive 14 point system review is negative.   PHYSICAL EXAM:   VS:  BP 118/80   Pulse 73   Ht 5' 9" (1.753 m)   Wt 183 lb 3.2 oz (83.1 kg)   BMI 27.05 kg/m     Repeat blood pressure by me was 124/76  Wt Readings from Last 3 Encounters:  07/14/19 183 lb 3.2 oz (83.1 kg)  06/05/19 186 lb 1.6 oz (84.4 kg)  05/14/19 180 lb (81.6 kg)   General: Alert, oriented, no distress.  Skin: normal turgor, no rashes, warm and dry HEENT: Normocephalic, atraumatic. Pupils equal round and reactive to light; sclera anicteric; extraocular muscles intact;  Nose without nasal septal hypertrophy Mouth/Parynx benign; Mallinpatti scale Neck: No JVD, no carotid bruits; normal carotid upstroke Lungs: clear to ausculatation and percussion; no wheezing or rales Chest wall: without tenderness to palpitation Heart: PMI not displaced, irregular irregular rhythm consistent with his permanent atrial fibrillation, ventricular rate controlled in the 70s, s1 s2 normal, 2/6 systolic murmur, no diastolic murmur, no rubs, gallops, thrills, or heaves Abdomen: soft, nontender; no hepatosplenomehaly, BS+; abdominal aorta nontender and not dilated by palpation. Back: no CVA tenderness Pulses 2+ Musculoskeletal: full range of motion, normal strength, no joint deformities Extremities: no clubbing cyanosis or edema, Homan's sign negative  Neurologic: grossly nonfocal; Cranial nerves grossly wnl Psychologic: Normal mood and affect    Studies/Labs Reviewed:   ECG (independently read by me): Atrial fibrillation at 73 bpm.  Nonspecific ST changes.  QS complex V1 through V3.  07/23/2017 ECG (independently read by me): Atrial fibrillation at 73 bpm.  Left axis deviation.  September 2018 EKG:  EKG is ordered  today. Atrial fibrillation with a slow ventricular response with ventricular rate at 47 bpm.  QRS complex V1 to V3.  Left axis deviation.  QTc interval 35 ms.  May 2018 ECG (independently read by me): Atrial fibrillation at 74 bpm.  Left anterior hemiblock.  QRS complex V1 and V2.  Nonspecific ST changes.  QTc interval 461 ms.  01/22/2017 ECG (independently read by me): Atrial fibrillation at a rate of 84 bpm.  Left anterior hemiblock.  Poor progression V1 through V3.  No significant ST segment changes.  Recent Labs: BMP Latest Ref Rng & Units 07/28/2018 02/26/2017  Glucose 70 - 99 mg/dL 93 101(H)  BUN 6 - 23 mg/dL 12 8  Creatinine 0.40 - 1.50 mg/dL 0.81 0.78  Sodium 135 - 145 mEq/L 141 142  Potassium 3.5 - 5.1 mEq/L 4.2 4.4  Chloride 96 - 112 mEq/L 104 104  CO2 19 - 32 mEq/L 30 26  Calcium 8.4 - 10.5 mg/dL 9.1 9.1     Hepatic Function Latest Ref Rng & Units 07/28/2018 02/26/2017  Total Protein 6.0 - 8.3 g/dL 6.2 6.4  Albumin 3.5 - 5.2 g/dL 4.0 4.1  AST 0 - 37 U/L 21 25  ALT 0 - 53 U/L 20 21  Alk Phosphatase 39 - 117 U/L 90 90  Total Bilirubin 0.2 - 1.2 mg/dL 0.9 0.8    CBC Latest Ref Rng & Units 07/28/2018 02/26/2017  WBC 4.0 - 10.5 K/uL 6.9 7.2  Hemoglobin 13.0 - 17.0 g/dL 13.5 14.5  Hematocrit 39.0 - 52.0 % 39.1 43.7  Platelets 150.0 - 400.0 K/uL 194.0 211   Lab Results  Component Value Date   MCV 88.7 07/28/2018   MCV 94.0 02/26/2017   Lab Results  Component Value Date   TSH 1.33 07/28/2018   No results found for: HGBA1C   BNP No results found for: BNP  ProBNP No results found for: PROBNP   Lipid Panel     Component Value Date/Time   CHOL 119 07/28/2018 1135   TRIG 114.0 07/28/2018 1135   HDL 40.30 07/28/2018 1135   CHOLHDL 3 07/28/2018 1135   VLDL 22.8 07/28/2018 1135   LDLCALC 56 07/28/2018 1135     RADIOLOGY: No results found.   Additional studies/ records that were reviewed today include:  I reviewed the office records from Dr. Leask at  [facility] in [place], [place].  The echo was reviewed    ASSESSMENT:    1. S/P aortic valve replacement   2. Permanent atrial fibrillation   3. Anticoagulation adequate   4. Essential hypertension   5. Hyperlipidemia, unspecified hyperlipidemia type   6. Post-polio syndrome      PLAN:  Mr. Ilan Marcus Galloway is a 78-year-old gentleman who unaware of his family history as he was abandoned by his biologic parents and sent to a foster home.  He underwent aortic valve replacement surgery for which I presume was aortic stenosis and had a 25 mm Edwards pericardial valve inserted.  At the time of his surgery, he had normal coronary arteries.  He is now in permanent atrial fibrillation, having undergone 4 attempts at cardioversion with recurrent atrial fibrillation and is on warfarin anticoagulation.  His 2-D echo Doppler study from 02/01/2017 demonstrated mild LVH with normal systolic function.  His bioprosthetic aortic valve was well-seated without stenosis and with only trivial AR.  He has severe biatrial enlargement undoubtedly contributing to his recurrent atrial fibrillation.  There is mild pulmonary  hypertension.  Presently, he feels well.  During childhood he was diagnosed with polio and he believes some of his muscular symptoms that he experiences intermittently may be associated with the post polio syndrome.  His blood pressure today is stable on his current regimen.  I reviewed laboratory   from September 2019.  Renal function was stable.  Thyroid function was normal.  Lipid studies were excellent with a total cholesterol 119, triglycerides 114, LDL 56.  Presently he will continue his current medical regimen.  His aortic valve replacement was in December 2013.  I will see him in 6 months for follow-up evaluation prior to that office visit he will undergo a 2-year follow-up echo Doppler assessment.   Medication Adjustments/Labs and Tests Ordered: Current medicines are  reviewed at length with the patient today.  Concerns regarding medicines are outlined above.  Medication changes, Labs and Tests ordered today are listed in the Patient Instructions below.  Patient Instructions  Medication Instructions:  The current medical regimen is effective;  continue present plan and medications as directed. Please refer to the Current Medication list given to you today. If you need a refill on your cardiac medications before your next appointment, please call your pharmacy.  Labwork: HAVE LABS DONE AT PCP   Testing/Procedures: Echocardiogram-IN 6 MONTHS BEFORE FOLLOW UP APPOINTMENT Your physician has requested that you have an echocardiogram. Echocardiography is a painless test that uses sound waves to create images of your heart. It provides your doctor with information about the size and shape of your heart and how well your heart's chambers and valves are working. This procedure takes approximately one hour. There are no restrictions for this procedure. This will be performed at our [address] location - [address], [address].  Follow-Up: You will need a follow up appointment in 6 months-AFTER ECHO. You may see Thomas Kelly, MD or one of the following Advanced Practice Providers on your designated Care Team: Hao Meng, PA-C . Angela Duke, PA-C     At [facility], you and your health needs are our priority.  As part of our continuing mission to provide you with exceptional heart care, we have created designated Provider Care Teams.  These Care Teams include your primary Cardiologist (physician) and Advanced Practice Providers (APPs -  Physician Assistants and Nurse Practitioners) who all work together to provide you with the care you need, when you need it.  Thank you for choosing [facility] at [facility]!!       Signed, Thomas Kelly, MD, FACC  07/16/2019 5:50 PM    [facility] [address], [address], [place], [place]  [address]  Phone: [phone]    

## 2019-07-14 NOTE — Patient Instructions (Signed)
Medication Instructions:  The current medical regimen is effective;  continue present plan and medications as directed. Please refer to the Current Medication list given to you today. If you need a refill on your cardiac medications before your next appointment, please call your pharmacy.  Labwork: HAVE LABS DONE AT PCP   Testing/Procedures: Echocardiogram-IN 6 MONTHS BEFORE FOLLOW UP APPOINTMENT Your physician has requested that you have an echocardiogram. Echocardiography is a painless test that uses sound waves to create images of your heart. It provides your doctor with information about the size and shape of your heart and how well your heart's chambers and valves are working. This procedure takes approximately one hour. There are no restrictions for this procedure. This will be performed at our [facility] location - [address].  Follow-Up: You will need a follow up appointment in 6 months-AFTER ECHO. You may see Shelva Majestic, MD or one of the following Advanced Practice Providers on your designated Care Team: [place], [place] . Fabian Sharp, PA-C     At [facility], you and your health needs are our priority.  As part of our continuing mission to provide you with exceptional heart care, we have created designated Provider Care Teams.  These Care Teams include your primary Cardiologist (physician) and Advanced Practice Providers (APPs -  Physician Assistants and Nurse Practitioners) who all work together to provide you with the care you need, when you need it.  Thank you for choosing [facility] at [facility]!!

## 2019-07-15 ENCOUNTER — Ambulatory Visit (INDEPENDENT_AMBULATORY_CARE_PROVIDER_SITE_OTHER): Payer: Medicare Other | Admitting: General Practice

## 2019-07-15 ENCOUNTER — Other Ambulatory Visit: Payer: Self-pay | Admitting: Family Medicine

## 2019-07-15 ENCOUNTER — Other Ambulatory Visit: Payer: Self-pay | Admitting: General Practice

## 2019-07-15 DIAGNOSIS — Z7901 Long term (current) use of anticoagulants: Secondary | ICD-10-CM

## 2019-07-15 LAB — POCT INR: INR: 4.8 — AB (ref 2.0–3.0)

## 2019-07-15 MED ORDER — METOPROLOL TARTRATE 50 MG PO TABS: 50.0000 mg | ORAL_TABLET | Freq: Two times a day (BID) | ORAL | 1 refills | Status: DC

## 2019-07-15 NOTE — Patient Instructions (Addendum)
Pre visit review using our clinic review tool, if applicable. No additional management support is needed unless otherwise documented below in the visit note.  Hold coumadin today and tomorrow and then continue to take 1 tablet daily except 1/2 tablet every Monday Wednesday and Saturday.   Re-check in 2 weeks.  Advised patient not to use alcohol.

## 2019-07-15 NOTE — Progress Notes (Signed)
I have reviewed this visit and I agree on the patient's plan of dosage and recommendations. Katera Rybka, DO   

## 2019-07-16 ENCOUNTER — Encounter: Payer: Self-pay | Admitting: Cardiovascular Disease

## 2019-07-20 ENCOUNTER — Telehealth: Payer: Self-pay | Admitting: *Deleted

## 2019-07-20 ENCOUNTER — Other Ambulatory Visit: Payer: Medicare Other

## 2019-07-20 ENCOUNTER — Ambulatory Visit: Payer: Medicare Other

## 2019-07-20 NOTE — Telephone Encounter (Signed)
TELEPHONE VISIT appt schedule for 07/23/19 @ 10:10 am

## 2019-07-20 NOTE — Telephone Encounter (Signed)
He called and stated he is not physically or mentally able to do this test at this time. He said he has no idea what this is. I told him I explained it to him when he was here and mailed the information to him. He acknowledged that. I again explained that he does not need to do much just press a button and write down the symptom. He said he does not know what a symptom is.  I told him there are numerous examples on the diary and that I explain it when here. He said he hurt his arm and I told him that no electrodes are near his arm. He was worried about the camera. I told him just plug it in and turn it on and that when here I go over it with him. He wants to talk to you to about this test.  So for today he has cancelled his 24 hour EEG. He wants to talk to you about this test.

## 2019-07-20 NOTE — Telephone Encounter (Signed)
He can make a virtual or telephone appointment to discuss.

## 2019-07-21 ENCOUNTER — Other Ambulatory Visit: Payer: Self-pay | Admitting: Family Medicine
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
## 2019-07-23 ENCOUNTER — Other Ambulatory Visit: Payer: Self-pay

## 2019-07-23 ENCOUNTER — Encounter: Payer: Self-pay | Admitting: Neurology

## 2019-07-23 ENCOUNTER — Telehealth (INDEPENDENT_AMBULATORY_CARE_PROVIDER_SITE_OTHER): Payer: Medicare Other | Admitting: Neurology

## 2019-07-23 VITALS — Ht 69.0 in | Wt 185.0 lb

## 2019-07-23 DIAGNOSIS — I680 Cerebral amyloid angiopathy: Secondary | ICD-10-CM

## 2019-07-23 DIAGNOSIS — R55 Syncope and collapse: Secondary | ICD-10-CM | POA: Diagnosis not present

## 2019-07-29 ENCOUNTER — Ambulatory Visit (INDEPENDENT_AMBULATORY_CARE_PROVIDER_SITE_OTHER): Payer: Medicare Other | Admitting: General Practice

## 2019-07-29 ENCOUNTER — Other Ambulatory Visit: Payer: Self-pay

## 2019-07-29 DIAGNOSIS — Z7901 Long term (current) use of anticoagulants: Secondary | ICD-10-CM

## 2019-07-29 LAB — POCT INR: INR: 1.2 — AB (ref 2.0–3.0)

## 2019-07-29 NOTE — Patient Instructions (Addendum)
Pre visit review using our clinic review tool, if applicable. No additional management support is needed unless otherwise documented below in the visit note.   Take 1 tablet today (9/30) and take 1 1/2 tablets tomorrow (10/1) and then continue to take 1 tablet daily except 1/2 tablet every Monday Wednesday and Saturday.   Re-check in 1 weeks.

## 2019-07-29 NOTE — Progress Notes (Signed)
I have reviewed this visit and I agree on the patient's plan of dosage and recommendations. Fatou Dunnigan, DO   

## 2019-08-05 ENCOUNTER — Other Ambulatory Visit: Payer: Self-pay

## 2019-08-05 ENCOUNTER — Ambulatory Visit (INDEPENDENT_AMBULATORY_CARE_PROVIDER_SITE_OTHER): Payer: Medicare Other | Admitting: General Practice

## 2019-08-05 DIAGNOSIS — Z7901 Long term (current) use of anticoagulants: Secondary | ICD-10-CM | POA: Diagnosis not present

## 2019-08-05 LAB — POCT INR: INR: 1.6 — AB (ref 2.0–3.0)

## 2019-08-05 NOTE — Patient Instructions (Signed)
Pre visit review using our clinic review tool, if applicable. No additional management support is needed unless otherwise documented below in the visit note.  Take 1 tablet today (10/7) and take 1 1/2 tablets tomorrow (10/8) and then change dosage and take 1 tablet daily except 1/2 tablet every Monday and Saturday.   Re-check in 2 weeks.

## 2019-08-12 ENCOUNTER — Ambulatory Visit
Admission: RE | Admit: 2019-08-12 | Discharge: 2019-08-12 | Disposition: A | Discharge status: Home / Self Care | Payer: Medicare Other | Source: Ambulatory Visit | Attending: Neurology | Admitting: Neurology

## 2019-08-12 ENCOUNTER — Other Ambulatory Visit: Payer: Self-pay

## 2019-08-12 ENCOUNTER — Other Ambulatory Visit: Payer: Self-pay | Admitting: Cardiovascular Disease

## 2019-08-12 DIAGNOSIS — G319 Degenerative disease of nervous system, unspecified: Secondary | ICD-10-CM | POA: Diagnosis not present

## 2019-08-12 DIAGNOSIS — I6389 Other cerebral infarction: Secondary | ICD-10-CM | POA: Diagnosis not present

## 2019-08-12 DIAGNOSIS — I6782 Cerebral ischemia: Secondary | ICD-10-CM | POA: Diagnosis not present

## 2019-08-12 DIAGNOSIS — I619 Nontraumatic intracerebral hemorrhage, unspecified: Secondary | ICD-10-CM | POA: Diagnosis not present

## 2019-08-12 DIAGNOSIS — R55 Syncope and collapse: Secondary | ICD-10-CM

## 2019-08-13 ENCOUNTER — Telehealth: Payer: Self-pay

## 2019-08-13 NOTE — Telephone Encounter (Signed)
-----   Message from Pieter Partridge, DO sent at 08/13/2019 12:41 PM EDT ----- No acute abnormality on MRI to explain black out spells

## 2019-08-13 NOTE — Telephone Encounter (Signed)
Pt advised.

## 2019-08-19 ENCOUNTER — Other Ambulatory Visit: Payer: Self-pay

## 2019-08-19 ENCOUNTER — Ambulatory Visit (INDEPENDENT_AMBULATORY_CARE_PROVIDER_SITE_OTHER): Payer: Medicare Other | Admitting: General Practice

## 2019-08-19 DIAGNOSIS — Z7901 Long term (current) use of anticoagulants: Secondary | ICD-10-CM | POA: Diagnosis not present

## 2019-08-19 DIAGNOSIS — Z23 Encounter for immunization: Secondary | ICD-10-CM | POA: Diagnosis not present

## 2019-08-19 LAB — POCT INR: INR: 3.4 — AB (ref 2.0–3.0)

## 2019-08-19 NOTE — Progress Notes (Signed)
I have reviewed this visit and I agree on the patient's plan of dosage and recommendations. Allanah Mcfarland, DO   

## 2019-08-19 NOTE — Patient Instructions (Addendum)
Pre visit review using our clinic review tool, if applicable. No additional management support is needed unless otherwise documented below in the visit note.  Skip coumadin today and then take 1 tablet daily except 1/2 tablet on Mon Wed and Saturdays.  Re-check in 4 weeks.

## 2019-09-08 ENCOUNTER — Other Ambulatory Visit: Payer: Self-pay

## 2019-09-08 ENCOUNTER — Ambulatory Visit (INDEPENDENT_AMBULATORY_CARE_PROVIDER_SITE_OTHER): Payer: Medicare Other | Admitting: Internal Medicine

## 2019-09-08 ENCOUNTER — Other Ambulatory Visit: Payer: Self-pay | Admitting: Internal Medicine

## 2019-09-08 ENCOUNTER — Encounter: Payer: Self-pay | Admitting: Internal Medicine

## 2019-09-08 VITALS — BP 124/80 | HR 64 | Temp 97.2°F | Ht 67.5 in | Wt 186.4 lb

## 2019-09-08 DIAGNOSIS — K219 Gastro-esophageal reflux disease without esophagitis: Secondary | ICD-10-CM

## 2019-09-08 DIAGNOSIS — E559 Vitamin D deficiency, unspecified: Secondary | ICD-10-CM | POA: Insufficient documentation

## 2019-09-08 DIAGNOSIS — Z7901 Long term (current) use of anticoagulants: Secondary | ICD-10-CM

## 2019-09-08 DIAGNOSIS — C61 Malignant neoplasm of prostate: Secondary | ICD-10-CM

## 2019-09-08 DIAGNOSIS — J439 Emphysema, unspecified: Secondary | ICD-10-CM

## 2019-09-08 DIAGNOSIS — I10 Essential (primary) hypertension: Secondary | ICD-10-CM

## 2019-09-08 DIAGNOSIS — Z23 Encounter for immunization: Secondary | ICD-10-CM

## 2019-09-08 DIAGNOSIS — Z0000 Encounter for general adult medical examination without abnormal findings: Secondary | ICD-10-CM

## 2019-09-08 DIAGNOSIS — Z952 Presence of prosthetic heart valve: Secondary | ICD-10-CM

## 2019-09-08 DIAGNOSIS — E785 Hyperlipidemia, unspecified: Secondary | ICD-10-CM

## 2019-09-08 DIAGNOSIS — I4819 Other persistent atrial fibrillation: Secondary | ICD-10-CM

## 2019-09-08 DIAGNOSIS — G3184 Mild cognitive impairment, so stated: Secondary | ICD-10-CM

## 2019-09-08 LAB — COMPREHENSIVE METABOLIC PANEL
ALT: 27 U/L (ref 0–53)
AST: 29 U/L (ref 0–37)
Albumin: 4.3 g/dL (ref 3.5–5.2)
Alkaline Phosphatase: 100 U/L (ref 39–117)
BUN: 12 mg/dL (ref 6–23)
CO2: 30 mEq/L (ref 19–32)
Calcium: 9.2 mg/dL (ref 8.4–10.5)
Chloride: 103 mEq/L (ref 96–112)
Creatinine, Ser: 0.63 mg/dL (ref 0.40–1.50)
GFR: 122.76 mL/min (ref >60.00)
Glucose, Bld: 104 mg/dL — ABNORMAL HIGH (ref 70–99)
Potassium: 4.5 mEq/L (ref 3.5–5.1)
Sodium: 140 mEq/L (ref 135–145)
Total Bilirubin: 2 mg/dL — ABNORMAL HIGH (ref 0.2–1.2)
Total Protein: 6.5 g/dL (ref 6.0–8.3)

## 2019-09-08 LAB — CBC WITH DIFFERENTIAL/PLATELET
Basophils Absolute: 0.1 10*3/uL (ref 0.0–0.1)
Basophils Relative: 1 % (ref 0.0–3.0)
Eosinophils Absolute: 0.1 10*3/uL (ref 0.0–0.7)
Eosinophils Relative: 1.4 % (ref 0.0–5.0)
HCT: 42.1 % (ref 39.0–52.0)
Hemoglobin: 14.1 g/dL (ref 13.0–17.0)
Lymphocytes Relative: 21.5 % (ref 12.0–46.0)
Lymphs Abs: 1.4 10*3/uL (ref 0.7–4.0)
MCHC: 33.6 g/dL (ref 30.0–36.0)
MCV: 96.4 fl (ref 78.0–100.0)
Monocytes Absolute: 0.6 10*3/uL (ref 0.1–1.0)
Monocytes Relative: 8.5 % (ref 3.0–12.0)
Neutro Abs: 4.5 10*3/uL (ref 1.4–7.7)
Neutrophils Relative %: 67.6 % (ref 43.0–77.0)
Platelets: 210 10*3/uL (ref 150.0–400.0)
RBC: 4.37 Mil/uL (ref 4.22–5.81)
RDW: 13.2 % (ref 11.5–15.5)
WBC: 6.7 10*3/uL (ref 4.0–10.5)

## 2019-09-08 LAB — LIPID PANEL
Cholesterol: 146 mg/dL (ref 0–200)
HDL: 66 mg/dL (ref >39.00)
LDL Cholesterol: 63 mg/dL (ref 0–99)
NonHDL: 79.88
Total CHOL/HDL Ratio: 2
Triglycerides: 82 mg/dL (ref 0.0–149.0)
VLDL: 16.4 mg/dL (ref 0.0–40.0)

## 2019-09-08 LAB — PSA: PSA: 0.01 ng/mL — ABNORMAL LOW (ref 0.10–4.00)

## 2019-09-08 LAB — VITAMIN B12: Vitamin B-12: 230 pg/mL (ref 211–911)

## 2019-09-08 LAB — VITAMIN D 25 HYDROXY (VIT D DEFICIENCY, FRACTURES): VITD: 25.1 ng/mL — ABNORMAL LOW (ref 30.00–100.00)

## 2019-09-08 LAB — TSH: TSH: 2.08 u[IU]/mL (ref 0.35–4.50)

## 2019-09-08 LAB — HEMOGLOBIN A1C: Hgb A1c MFr Bld: 5.4 % (ref 4.6–6.5)

## 2019-09-08 MED ORDER — VITAMIN D (ERGOCALCIFEROL) 1.25 MG (50000 UNIT) PO CAPS: 50000.0000 [IU] | ORAL_CAPSULE | ORAL | 0 refills | Status: AC

## 2019-09-08 NOTE — Patient Instructions (Signed)
-  Nice seeing you today!!  -Lab work today; will notify you once results are available.  -Pneumonia vaccine today.  -Tetanus vaccine at your pharmacy.  -Schedule follow up in 4 months.

## 2019-09-08 NOTE — Addendum Note (Signed)
Addended by: Westley Hummer B on: 09/08/2019 05:07 PM   Modules accepted: Orders

## 2019-09-08 NOTE — Progress Notes (Signed)
Established Patient Office Visit     CC/Reason for Visit: Annual preventive exam and subsequent Medicare wellness visit  HPI: Marcus Galloway is a 78 y.o. male who is coming in today for the above mentioned reasons. 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  His cardiologist is Dr. Claiborne Billings.  He has no acute complaints today.  He does not have routine eye and dental care.  He is due for Pneumovax and tetanus today, otherwise immunizations are up-to-date.  He does not do any type of physical activity, he denies any hearing difficulty.   Past Medical/Surgical History: Past Medical History:  Diagnosis Date  . Aortic valve disease   . Atrial fibrillation ([place])   . BPH (benign prostatic hyperplasia)   . Chronic neck pain   . GERD (gastroesophageal reflux disease)   . History of colonic polyps   . Hyperlipidemia   . Hypertension   . Osteoarthritis   . Persistent atrial fibrillation ([place])   . Polio   . Prostate cancer [facility])     Past Surgical History:  Procedure Laterality Date  . 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  . CARDIOVERSION  09/2016  . HERNIA REPAIR  1990  . INSERTION PROSTATE RADIATION SEED  2002    Social History:  reports that he has quit smoking. He has a 90.00 pack-year smoking history. He has never used smokeless tobacco. He reports current alcohol use of about 1.0 standard drinks of alcohol per week. He reports that he does not use drugs.  Allergies: No Known Allergies  Family History:  Family History  Adopted: Yes  Family history unknown: Yes     Current Outpatient Medications:  .  atorvastatin (LIPITOR) 10 MG tablet, TAKE ONE TABLET BY  MOUTH DAILY, Disp: 90 tablet, Rfl: 3 .  CARTIA XT 180 MG 24 hr capsule, TAKE ONE CAPSULE BY MOUTH DAILY KEEP OFFICE VISIT!, Disp: 30 capsule, Rfl: 0 .  metoprolol tartrate (LOPRESSOR) 50 MG tablet, Take 1 tablet (50 mg total) by mouth 2 (two) times daily., Disp: 180 tablet, Rfl: 1 .  OMEPRAZOLE PO, Take 20 mg by mouth daily., Disp: , Rfl:  .  tamsulosin (FLOMAX) 0.4 MG CAPS capsule, TAKE ONE CAPSULE BY MOUTH DAILY, Disp: 90 capsule, Rfl: 0 .  warfarin (COUMADIN) 5 MG tablet, TAKE ONE TABLET BY MOUTH DAILY AS DIRECTED, Disp: 100 tablet, Rfl: 0  Review of Systems:  Constitutional: Denies fever, chills, diaphoresis, appetite change and fatigue.  HEENT: Denies photophobia, eye pain, redness, hearing loss, ear pain, congestion, sore throat, rhinorrhea, sneezing, mouth sores, trouble swallowing, neck pain, neck stiffness and tinnitus.   Respiratory: Denies SOB, DOE, cough, chest tightness,  and wheezing.   Cardiovascular: Denies chest pain, palpitations and leg swelling.  Gastrointestinal: Denies nausea, vomiting, abdominal pain, diarrhea, constipation, blood in stool and abdominal distention.  Genitourinary: Denies dysuria, urgency, frequency, hematuria, flank pain and difficulty urinating.  Endocrine: Denies: hot or cold intolerance, sweats, changes in hair or nails, polyuria, polydipsia. Musculoskeletal: Denies myalgias, back pain, joint swelling, arthralgias and gait problem.  Skin: Denies pallor, rash and wound.  Neurological: Denies dizziness, seizures, syncope, weakness, light-headedness, numbness and headaches.  Hematological: Denies adenopathy. Easy bruising, personal or family bleeding history  Psychiatric/Behavioral: Denies suicidal ideation,  mood changes, confusion, nervousness, sleep disturbance and agitation    Physical Exam: Vitals:   09/08/19 1058  BP: 124/80  Pulse: 64  Temp: (!) 97.2 F (36.2 C)  TempSrc: Temporal  SpO2: 98%  Weight: 186 lb 6.4 oz (84.6 kg)  Height: 5'  7.5" (1.715 m)    Body mass index is 28.76 kg/m.   Constitutional: NAD, calm, comfortable Eyes: PERRL, lids and conjunctivae normal, wears corrective lenses ENMT: Mucous membranes are moist.Tympanic membrane is pearly white, no erythema or bulging. Neck: normal, supple, no masses, no thyromegaly Respiratory: clear to auscultation bilaterally, no wheezing, no crackles. Normal respiratory effort. No accessory muscle use.  Cardiovascular: Regular rate and rhythm, no murmurs / rubs / gallops. No extremity edema. 2+ pedal pulses. No carotid bruits.  Abdomen: no tenderness, no masses palpated. No hepatosplenomegaly. Bowel sounds positive.  Musculoskeletal: no clubbing / cyanosis. No joint deformity upper and lower extremities. Good ROM, no contractures. Normal muscle tone.  Skin: no rashes, lesions, ulcers. No induration Neurologic: CN 2-12 grossly intact. Sensation intact, DTR normal. Strength 5/5 in all 4.  Psychiatric: Normal judgment and insight. Alert and oriented x 3. Normal mood.    Subsequent Medicare wellness visit   1. Risk factors, based on past  M,S,F -cardiovascular disease risk factors include age, gender, history of hypertension, hyperlipidemia   2.  Physical activities: He does no physical activity on a routine basis   3.  Depression/mood:  Stable, not depressed   4.  Hearing:  No perceived issues   5.  ADL's: Independent in all ADLs   6.  Fall risk:  Low fall risk   7.  Home safety: No problems identified   8.  Height weight, and visual acuity: Height and weight as above, visual acuity is 20/20 in the left eye, 20/25 with the right eye in both eyes together.   9.  Counseling:  Advised 30 to 45 minutes 3 times a week of moderate aerobic intensity physical activity.   10. Lab orders based on risk factors: Laboratory update will be reviewed   11. Referral :  None today   12. Care plan:  Follow-up with me in 4 months   13. Cognitive assessment:  Mild cognitive  impairment   14. Screening: Patient provided with a written and personalized 5-10 year screening schedule in the AVS.   yes   15. Provider List Update:   PCP, cardiology (Dr. Claiborne Billings), neurology (Dr. Tomi Likens)  75. Advance Directives: Full code     Office Visit from 09/08/2019 in [facility] [facility] [place]  PHQ-9 Total Score  1      Fall Risk  09/08/2019 07/23/2019 06/05/2019 05/14/2019 07/28/2018  Falls in the past year? 0 0 0 0 No  Number falls in past yr: 0 0 0 - -  Injury with Fall? 0 0 0 - -  Follow up - - - Falls evaluation completed -     Impression and Plan:  Encounter for preventive health examination  -Have advised routine eye and dental care. -To receive Pneumovax in office today, advised tetanus at local pharmacy, otherwise immunizations are up-to-date. -Screening labs today. -Healthy lifestyle has been discussed in detail. -Had colonoscopy in 2015 and was told he no longer needed to follow-up. -Has a history of prostate cancer, check PSA today.  Essential hypertension -Well-controlled on current regimen.  Long term (current) use of anticoagulants [Z79.01], for persistent atrial fibrillation and valve replacement Persistent atrial fibrillation ([place]), on Coumadin  -Appears to be  in A. fib today but is rate controlled on metoprolol and diltiazem and anticoagulated on Coumadin.  S/P aortic valve replacement -Anticoagulated.  Followed by Coumadin clinic  Hyperlipidemia, unspecified hyperlipidemia type  -Last LDL was 56 in September 2019 stop -Recheck lipids today, continue atorvastatin.  Gastroesophageal reflux disease without esophagitis -Well-controlled on omeprazole.  Pulmonary emphysema, unspecified emphysema type ([place]) -Not on chronic oxygen, no significant difficulty with exercise tolerance.  Prostate cancer ([place])  - Plan: PSA  Mild cognitive impairment -Followed by neurology.    Patient Instructions  -Nice seeing you today!!  -Lab work today;  will notify you once results are available.  -Pneumonia vaccine today.  -Tetanus vaccine at your pharmacy.  -Schedule follow up in 4 months.     Lelon Frohlich, MD [facility]

## 2019-09-10 ENCOUNTER — Telehealth: Payer: Self-pay | Admitting: *Deleted

## 2019-09-10 ENCOUNTER — Other Ambulatory Visit: Payer: Self-pay | Admitting: Internal Medicine

## 2019-09-10 DIAGNOSIS — E559 Vitamin D deficiency, unspecified: Secondary | ICD-10-CM

## 2019-09-10 NOTE — Telephone Encounter (Signed)
Copied from [place] [id number]. Topic: General - Other >> Sep 10, 2019 10:15 AM Sheran Luz wrote: Patient requesting call back from [place]. He states they were speaking earlier and would like to continue conversation from today.

## 2019-09-11 NOTE — Telephone Encounter (Signed)
Spoke with patient and he states his right had has been swollen but has improved some today.  Patient was offered a virtual appointment today, but declined.  Patient will call back if needed.

## 2019-09-16 ENCOUNTER — Other Ambulatory Visit: Payer: Self-pay

## 2019-09-16 ENCOUNTER — Ambulatory Visit (INDEPENDENT_AMBULATORY_CARE_PROVIDER_SITE_OTHER): Payer: Medicare Other | Admitting: General Practice

## 2019-09-16 DIAGNOSIS — Z7901 Long term (current) use of anticoagulants: Secondary | ICD-10-CM

## 2019-09-16 LAB — POCT INR: INR: 3.9 — AB (ref 2.0–3.0)

## 2019-09-16 NOTE — Progress Notes (Signed)
I have reviewed and agree with note, evaluation, plan.   Stephen Hunter, MD  

## 2019-09-16 NOTE — Patient Instructions (Addendum)
Pre visit review using our clinic review tool, if applicable. No additional management support is needed unless otherwise documented below in the visit note.  Skip coumadin today (11/18) and take 1/2 tablet tomorrow (11/19).  On Friday change dosage and take 1 tablet daily except 1/2 tablet on Mon Wed Fridays and Saturdays.  Re-check in 3 weeks.

## 2019-10-07 ENCOUNTER — Other Ambulatory Visit: Payer: Self-pay

## 2019-10-07 ENCOUNTER — Ambulatory Visit (INDEPENDENT_AMBULATORY_CARE_PROVIDER_SITE_OTHER): Payer: Medicare Other | Admitting: General Practice

## 2019-10-07 DIAGNOSIS — Z7901 Long term (current) use of anticoagulants: Secondary | ICD-10-CM

## 2019-10-07 LAB — POCT INR: INR: 4.6 — AB (ref 2.0–3.0)

## 2019-10-07 NOTE — Progress Notes (Signed)
I have reviewed and agree with note, evaluation, plan.   Dreux Mcgroarty, MD  

## 2019-10-07 NOTE — Patient Instructions (Addendum)
Pre visit review using our clinic review tool, if applicable. No additional management support is needed unless otherwise documented below in the visit note.  Skip coumadin today and tomorrow and then change dosage and take 1/2 tablet every day except 1 tablet on Sundays only.  Re-check in 2 weeks.

## 2019-10-15 ENCOUNTER — Other Ambulatory Visit: Payer: Self-pay | Admitting: Family Medicine

## 2019-10-21 ENCOUNTER — Other Ambulatory Visit: Payer: Self-pay

## 2019-10-21 ENCOUNTER — Ambulatory Visit (INDEPENDENT_AMBULATORY_CARE_PROVIDER_SITE_OTHER): Payer: Medicare Other | Admitting: General Practice

## 2019-10-21 DIAGNOSIS — Z7901 Long term (current) use of anticoagulants: Secondary | ICD-10-CM

## 2019-10-21 LAB — POCT INR: INR: 2.2 (ref 2.0–3.0)

## 2019-10-21 NOTE — Patient Instructions (Addendum)
Pre visit review using our clinic review tool, if applicable. No additional management support is needed unless otherwise documented below in the visit note.  Continue to take 1/2 tablet every day except 1 tablet on Sundays only.  Re-check in 4 weeks.

## 2019-10-21 NOTE — Progress Notes (Signed)
I have reviewed and agree with note, evaluation, plan.   Davionne Mastrangelo, MD  

## 2019-10-22 ENCOUNTER — Other Ambulatory Visit: Payer: Self-pay | Admitting: Family Medicine

## 2019-11-02 ENCOUNTER — Other Ambulatory Visit: Payer: Self-pay | Admitting: Cardiovascular Disease

## 2019-11-02 NOTE — Telephone Encounter (Signed)
New Message       *STAT* If patient is at the pharmacy, call can be transferred to refill team.   1. Which medications need to be refilled? (please list name of each medication and dose if known) CARTIA XT 180 MG 24 hr capsule  2. Which pharmacy/location (including street and city if local pharmacy) is medication to be sent to? [place], [place]  3. Do they need a 30 day or 90 day supply? 30 or 44   Pt says his bottle says no refills left and is not sure if he is suppose to stop taking

## 2019-11-03 MED ORDER — DILTIAZEM HCL ER COATED BEADS 180 MG PO CP24: 180.0000 mg | ORAL_CAPSULE | Freq: Every day | ORAL | 1 refills | Status: DC

## 2019-11-03 NOTE — Telephone Encounter (Signed)
Rx has been sent to the pharmacy electronically. ° °

## 2019-11-18 ENCOUNTER — Other Ambulatory Visit: Payer: Self-pay

## 2019-11-18 ENCOUNTER — Ambulatory Visit (INDEPENDENT_AMBULATORY_CARE_PROVIDER_SITE_OTHER): Payer: Medicare Other | Admitting: General Practice

## 2019-11-18 DIAGNOSIS — Z7901 Long term (current) use of anticoagulants: Secondary | ICD-10-CM

## 2019-11-18 LAB — POCT INR: INR: 2.2 (ref 2.0–3.0)

## 2019-11-18 NOTE — Patient Instructions (Addendum)
Pre visit review using our clinic review tool, if applicable. No additional management support is needed unless otherwise documented below in the visit note.  Continue to take 1/2 tablet every day except 1 tablet on Sundays only.  Re-check in 6 weeks.

## 2019-11-18 NOTE — Progress Notes (Signed)
I have reviewed and agree with note, evaluation, plan.   Afifa Truax, MD  

## 2019-11-30 ENCOUNTER — Other Ambulatory Visit: Payer: Self-pay | Admitting: Internal Medicine

## 2019-11-30 DIAGNOSIS — E559 Vitamin D deficiency, unspecified: Secondary | ICD-10-CM

## 2019-12-14 ENCOUNTER — Other Ambulatory Visit: Payer: Self-pay

## 2019-12-15 ENCOUNTER — Other Ambulatory Visit: Payer: Self-pay

## 2019-12-16 ENCOUNTER — Other Ambulatory Visit (INDEPENDENT_AMBULATORY_CARE_PROVIDER_SITE_OTHER): Payer: Medicare Other

## 2019-12-16 DIAGNOSIS — E559 Vitamin D deficiency, unspecified: Secondary | ICD-10-CM

## 2019-12-16 LAB — VITAMIN D 25 HYDROXY (VIT D DEFICIENCY, FRACTURES): VITD: 29.04 ng/mL — ABNORMAL LOW (ref 30.00–100.00)

## 2019-12-18 ENCOUNTER — Other Ambulatory Visit: Payer: Self-pay | Admitting: Internal Medicine

## 2019-12-18 DIAGNOSIS — E559 Vitamin D deficiency, unspecified: Secondary | ICD-10-CM

## 2019-12-18 MED ORDER — VITAMIN D (ERGOCALCIFEROL) 1.25 MG (50000 UNIT) PO CAPS: 50000.0000 [IU] | ORAL_CAPSULE | ORAL | 0 refills | Status: AC

## 2019-12-28 ENCOUNTER — Other Ambulatory Visit: Payer: Self-pay | Admitting: Family Medicine

## 2019-12-28 ENCOUNTER — Other Ambulatory Visit: Payer: Self-pay | Admitting: Internal Medicine

## 2019-12-30 ENCOUNTER — Other Ambulatory Visit: Payer: Self-pay

## 2019-12-30 ENCOUNTER — Ambulatory Visit (INDEPENDENT_AMBULATORY_CARE_PROVIDER_SITE_OTHER): Payer: Medicare Other | Admitting: General Practice

## 2019-12-30 ENCOUNTER — Other Ambulatory Visit: Payer: Self-pay | Admitting: Family Medicine

## 2019-12-30 DIAGNOSIS — Z7901 Long term (current) use of anticoagulants: Secondary | ICD-10-CM

## 2019-12-30 LAB — POCT INR: INR: 1.4 — AB (ref 2.0–3.0)

## 2019-12-30 NOTE — Patient Instructions (Addendum)
Pre visit review using our clinic review tool, if applicable. No additional management support is needed unless otherwise documented below in the visit note.  Take 1 tablet today, tomorrow and Friday and then continue to take 1/2 tablet every day except 1 tablet on Sundays only.  Re-check in 2 weeks.

## 2019-12-30 NOTE — Progress Notes (Signed)
I have reviewed and agree with note, evaluation, plan.   Cattaleya Wien, MD  

## 2019-12-31 ENCOUNTER — Other Ambulatory Visit: Payer: Self-pay | Admitting: *Deleted

## 2019-12-31 MED ORDER — TAMSULOSIN HCL 0.4 MG PO CAPS: 0.4000 mg | ORAL_CAPSULE | Freq: Every day | ORAL | 1 refills | Status: DC

## 2020-01-06 ENCOUNTER — Ambulatory Visit: Payer: Medicare Other | Admitting: Internal Medicine

## 2020-01-11 ENCOUNTER — Other Ambulatory Visit (HOSPITAL_COMMUNITY): Payer: Medicare Other

## 2020-01-14 ENCOUNTER — Encounter: Payer: Self-pay | Admitting: Cardiovascular Disease

## 2020-01-14 ENCOUNTER — Ambulatory Visit: Payer: Medicare Other | Admitting: Cardiovascular Disease

## 2020-01-14 ENCOUNTER — Other Ambulatory Visit: Payer: Self-pay

## 2020-01-14 VITALS — BP 130/68 | HR 66 | Ht 69.0 in | Wt 185.0 lb

## 2020-01-14 DIAGNOSIS — Z952 Presence of prosthetic heart valve: Secondary | ICD-10-CM

## 2020-01-14 DIAGNOSIS — I4821 Permanent atrial fibrillation: Secondary | ICD-10-CM | POA: Diagnosis not present

## 2020-01-14 DIAGNOSIS — G14 Postpolio syndrome: Secondary | ICD-10-CM

## 2020-01-14 DIAGNOSIS — E785 Hyperlipidemia, unspecified: Secondary | ICD-10-CM | POA: Diagnosis not present

## 2020-01-14 DIAGNOSIS — Z7901 Long term (current) use of anticoagulants: Secondary | ICD-10-CM

## 2020-01-14 DIAGNOSIS — I10 Essential (primary) hypertension: Secondary | ICD-10-CM | POA: Diagnosis not present

## 2020-01-14 NOTE — Progress Notes (Signed)
Cardiology Office Note    Date:  01/14/2020   ID:  Marcus Galloway, DOB 07-02-1941, MRN [id number]  PCP:  Isaac Bliss, Rayford Halsted, MD  Cardiologist:  Shelva Majestic, MD   No chief complaint on file.   History of Present Illness:  Marcus Galloway is a 79 y.o. male Caucasian male who moved to [place] from [place].  He established cardiology care with me in March 2018. I last saw him in September 2020.  He presents for a 6 month follow-up evaluation.  Mr. 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  He was found to be in atrial fibrillation on routine office visit in November 2017.  Rate control and anticoagulation were initiated at that time and ultimately underwent cardioversion.  He develop recurrent atrial fibrillation and underwent sotalol loading with repeat cardioversion.  An echo Doppler study had shown normal LV size and function with an EF of 50-55%, biatrial enlargement, mild-to-moderate MR, moderate TR, and a normal prosthetic aortic valve.  When last seen by his cardiologist, Dr. Kellie Simmering in  [facility] [place] on 12/10/2016 , he was back in atrial fibrillation.  At that time, eliquis was discontinued as was sotalol and he was started on metoprolol tartrate 75 mg twice a day and Coumadin for anticoagulation.  He has subsequently moved to [place] and establish care with me.    His initial evaluation with me he denied any episodes of chest pain.  He reportedly had undergone cardiac catheterization prior to his valve replacement and his coronary arteries were clean.  Recently, he had noticed exertional dyspnea.  He is unaware of rapid heartbeats on his current dose of metoprolol.  He admits to adequate sleep.  He is unaware of awakening gasping for breath, and he believes his sleep is restorative.  When I initially saw him  his ECG revealed atrial fibrillation with ventricular rate in the 80s.  I recommended discontinuance of amlodipine and change to diltiazem CD 180 mg for improved rate control as well.  Blood pressure control.  I recommended a follow-up echo Doppler study, and this was done on 02/01/2017.  This revealed an ejection fraction of 60-65%.  He had normal wall motion without regional wall motion abnormalities.  His bioprosthetic aortic valve was well-seated, without obstruction and only with trivial regurgitation. He had severe biatrial enlargement.  There was mild pulmonary hypertension at 36 mm with mild TR.    When I saw him in September 2018 he had noticed some mild episodes of dizziness and also some vague chest wall discomfort.  He admitted to being sleepy during the day.  He typically goes to bed at 11 PM and wakes up at 7 AM.  Laboratory in May 2018 was stable.  His cholesterol was 151, LDL cholesterol 68, triglycerides 130, HDL 57.  He was taking atorvastatin 10 mg, metoprolol 75 mg twice a day, warfarin anticoagulation, Cartia XT 180 mg in addition to Flomax.  He was feeling significantly improved on diltiazem instead of amlodipine and I recommended slight reduction of metoprolol due to some transient episodes of mild dizziness.  I saw him for 2-year follow-up evaluation in September 2020.  Over the past several years he felt that he was been doing fairly well.  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  He remained free of chest pain.  He denies PND orthopnea.  He continues to be on warfarin without bleeding.  He continues to be on diltiazem 180 mg, metoprolol 50 mg twice a day and is on atorvastatin at 10 mg daily for hyperlipidemia.    When I saw him, I recommended he undergo a follow-up echo Doppler study prior to 27-monthfollow-up office visit.  He never the  echocardiographic evaluation done.  Over the past 6 months he has continued to be stable.  He denies chest pain PND orthopnea, palpitations, presyncope or syncope.  Denies difficulty with sleep.  He continues to be on his medical therapy and had undergone follow-up laboratory by his primary physician in February.  He presents for evaluation.  Past Medical History:  Diagnosis Date  . Aortic valve disease   . Atrial fibrillation ([place]   . BPH (benign prostatic hyperplasia)   . Chronic neck pain   . GERD (gastroesophageal reflux disease)   . History of colonic polyps   . Hyperlipidemia   . Hypertension   . Osteoarthritis   . Persistent atrial fibrillation ([place]   . Polio   . Prostate cancer ([facility]     Past Surgical History:  Procedure Laterality Date  . 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  . CARDIOVERSION  09/2016  . HERNIA REPAIR  1990  . INSERTION PROSTATE RADIATION SEED  2002    Current Medications: Outpatient Medications Prior to Visit  Medication Sig Dispense Refill  . ascorbic acid (VITAMIN C) 500 MG tablet Take by mouth.    .Marland Kitchenatorvastatin (LIPITOR) 10 MG tablet TAKE ONE TABLET BY MOUTH DAILY 90 tablet 3  . diltiazem (CARTIA XT) 180 MG 24 hr capsule Take 1 capsule (180 mg total) by mouth daily. 90 capsule 1  . metoprolol tartrate (LOPRESSOR) 50 MG tablet TAKE ONE TABLET BY MOUTH TWICE A DAY 180 tablet 1  . OMEPRAZOLE PO Take 20 mg by mouth daily.    . tamsulosin (FLOMAX) 0.4 MG CAPS capsule Take 1 capsule (0.4 mg total) by mouth daily. 90 capsule 1  . Vitamin D, Ergocalciferol, (DRISDOL) 1.25 MG (50000 UNIT) CAPS capsule Take 1 capsule (50,000 Units total) by mouth every 7 (seven) days for 12 doses. 12 capsule 0  . warfarin (COUMADIN) 5 MG tablet TAKE ONE TABLET BY MOUTH DAILY AS DIRECTED 100 tablet 0   No facility-administered medications prior to visit.     Allergies:   Patient has no known allergies.   Social History   Socioeconomic History   . Marital status: Widowed    Spouse name: Not on file  . Number of children: 4  . Years of education: 171 . Highest education level: Not on file  Occupational History  . Occupation: Retired  Tobacco Use  . Smoking status: Former Smoker    Packs/day: 3.00    Years: 30.00    Pack years: 90.00  . Smokeless tobacco: Never Used  . Tobacco comment: quit smoking 1980  Substance and Sexual Activity  . Alcohol use: Yes    Alcohol/week: 1.0 standard drinks    Types: 1 Glasses of wine per week    Comment: occasion  . Drug use: No  . Sexual activity: Not Currently  Other Topics Concern  . Not on file  Social History Narrative   Lives alone, [place] [facility] [place] [facility]   Right-handed   Caffeine: 3 cups each morning   Social Determinants of Health   Financial Resource Strain:   . Difficulty of Paying Living Expenses:   Food Insecurity:   . Worried About Charity fundraiser in the Last Year:   . Arboriculturist in the Last Year:   Transportation Needs:   . Film/video editor (Medical):   Marland Kitchen Lack of Transportation (Non-Medical):   Physical Activity:   . Days of Exercise per Week:   . Minutes of Exercise per Session:   Stress:   . Feeling of Stress :   Social Connections:   . Frequency of Communication with Friends and Family:   . Frequency of Social Gatherings with Friends and Family:   . Attends Religious Services:   . Active Member of Clubs or Organizations:   . Attends Archivist Meetings:   Marland Kitchen Marital Status:     Social history is notable in that he is widowed.  He is moved back here to be close to his children.  He had smoked remotely from age 20 until age 71 but quit in 23.  He has 4 children.  One daughter had undergone aortic valve replacement.  He has a half-sister but her whereabouts is unknown.  He drinks occasional alcohol with wine or scotch.   Family History:  He was adopted. Family history is unknown by patient.   However, his biological parents are deceased and he was able to find through records that his mother died at age 74 and his father died at age 78.  ROS General: Negative; No fevers, chills, or night sweats;  HEENT: Negative; No changes in vision or hearing, sinus congestion, difficulty swallowing Pulmonary: Negative; No cough, wheezing, shortness of breath, hemoptysis Cardiovascular: see HPI GI: History of GERD on omeprazole GU: He has a history of prostate CA and underwent radiation seeds in 2001. Musculoskeletal: Negative; no myalgias, joint pain , or weakness/  he had polio at age 62-6 and was in the hospital for 5 months. Hematologic/Oncology: Negative; no easy bruising, bleeding Endocrine: Negative; no heat/cold intolerance; no diabetes Neuro: Negative; no changes in balance, headaches Skin: Negative; No rashes or skin lesions Psychiatric: Negative; No behavioral problems, depression Sleep: Negative; No snoring, daytime sleepiness, hypersomnolence, bruxism, restless legs, hypnogognic hallucinations, no cataplexy Other comprehensive 14 point system review is negative.   PHYSICAL EXAM:   VS:  BP 130/68   Pulse 66   Ht '5\' 9"'$  (1.753 m)   Wt 185 lb (83.9 kg)   SpO2 98%   BMI 27.32 kg/m     Repeat blood pressure by me was 122/70  Wt Readings from Last 3 Encounters:  01/14/20 185 lb (83.9 kg)  09/08/19 186 lb 6.4 oz (84.6 kg)  07/23/19 185 lb (83.9 kg)   General: Alert, oriented, no distress.  Skin: normal turgor, no rashes, warm and dry HEENT: Normocephalic, atraumatic. Pupils equal round and reactive to light; sclera anicteric; extraocular muscles intact;  Nose without nasal septal hypertrophy Mouth/Parynx benign; Mallinpatti scale 3 Neck: No JVD, no carotid bruits; normal carotid upstroke Lungs: clear to ausculatation and percussion; no wheezing or rales Chest wall: without tenderness to palpitation Heart: PMI not displaced, regular regular rhythm consistent with his atrial  fibrillation with controlled ventricular rate in the 60s, s1 s2 normal, 2-7/7 systolic murmur in the aortic area consistent with his aortic valve replacement, no diastolic murmur, no rubs, gallops, thrills, or heaves Abdomen: soft, nontender;  no hepatosplenomehaly, BS+; abdominal aorta nontender and not dilated by palpation. Back: no CVA tenderness Pulses 2+ Musculoskeletal: full range of motion, normal strength, no joint deformities Extremities: no clubbing cyanosis or edema, Homan's sign negative  Neurologic: grossly nonfocal; Cranial nerves grossly wnl Psychologic: Normal mood and affect   Studies/Labs Reviewed:   ECG (independently read by me): Atrial fibrillation with controlled ventricular rate at 66 bpm.  QS complex V1 through V3.  QTc interval 419 ms.     September 2020 ECG (independently read by me): Atrial fibrillation at 73 bpm.  Nonspecific ST changes.  QS complex V1 through V3.  07/23/2017 ECG (independently read by me): Atrial fibrillation at 73 bpm.  Left axis deviation.  September 2018 EKG:  EKG is ordered today. Atrial fibrillation with a slow ventricular response with ventricular rate at 47 bpm.  QRS complex V1 to V3.  Left axis deviation.  QTc interval 35 ms.  May 2018 ECG (independently read by me): Atrial fibrillation at 74 bpm.  Left anterior hemiblock.  QRS complex V1 and V2.  Nonspecific ST changes.  QTc interval 461 ms.  01/22/2017 ECG (independently read by me): Atrial fibrillation at a rate of 84 bpm.  Left anterior hemiblock.  Poor progression V1 through V3.  No significant ST segment changes.  Recent Labs: BMP Latest Ref Rng & Units 09/08/2019 07/28/2018 02/26/2017  Glucose 70 - 99 mg/dL 104(H) 93 101(H)  BUN 6 - 23 mg/dL '12 12 8  '$ Creatinine 0.40 - 1.50 mg/dL 0.63 0.81 0.78  Sodium 135 - 145 mEq/L 140 141 142  Potassium 3.5 - 5.1 mEq/L 4.5 4.2 4.4  Chloride 96 - 112 mEq/L 103 104 104  CO2 19 - 32 mEq/L '30 30 26  '$ Calcium 8.4 - 10.5 mg/dL 9.2 9.1 9.1      Hepatic Function Latest Ref Rng & Units 09/08/2019 07/28/2018 02/26/2017  Total Protein 6.0 - 8.3 g/dL 6.5 6.2 6.4  Albumin 3.5 - 5.2 g/dL 4.3 4.0 4.1  AST 0 - 37 U/L '29 21 25  '$ ALT 0 - 53 U/L '27 20 21  '$ Alk Phosphatase 39 - 117 U/L 100 90 90  Total Bilirubin 0.2 - 1.2 mg/dL 2.0(H) 0.9 0.8    CBC Latest Ref Rng & Units 09/08/2019 07/28/2018 02/26/2017  WBC 4.0 - 10.5 K/uL 6.7 6.9 7.2  Hemoglobin 13.0 - 17.0 g/dL 14.1 13.5 14.5  Hematocrit 39.0 - 52.0 % 42.1 39.1 43.7  Platelets 150.0 - 400.0 K/uL 210.0 194.0 211   Lab Results  Component Value Date   MCV 96.4 09/08/2019   MCV 88.7 07/28/2018   MCV 94.0 02/26/2017   Lab Results  Component Value Date   TSH 2.08 09/08/2019   Lab Results  Component Value Date   HGBA1C 5.4 09/08/2019     BNP No results found for: BNP  ProBNP No results found for: PROBNP   Lipid Panel     Component Value Date/Time   CHOL 146 09/08/2019 1122   TRIG 82.0 09/08/2019 1122   HDL 66.00 09/08/2019 1122   CHOLHDL 2 09/08/2019 1122   VLDL 16.4 09/08/2019 1122   LDLCALC 63 09/08/2019 1122     RADIOLOGY: No results found.   Additional studies/ records that were reviewed today include:  I reviewed the office records from Dr. Bunnie Philips at  [facility] in [place].  The echo was reviewed    ASSESSMENT:    No diagnosis found.   PLAN:  Mr. Niki Cosman is  a 79 year old gentleman who is unaware of his family history as he was abandoned by his biologic parents and sent to a foster home.  He underwent aortic valve replacement surgery in December 2013 for which I presume was aortic stenosis and had a 25 mm Edwards pericardial valve inserted.  At the time of his surgery, he had normal coronary arteries.  He is now in permanent atrial fibrillation, having undergone 4 attempts at cardioversion with recurrent atrial fibrillation and is on warfarin anticoagulation.  His 2-D echo Doppler study from 02/01/2017  demonstrated mild LVH with normal systolic function.  His bioprosthetic aortic valve was well-seated without stenosis and with only trivial AR.  He has severe biatrial enlargement undoubtedly contributing to his recurrent atrial fibrillation.  There is mild pulmonary  hypertension.  Presently, he has continued to feel well.  His blood pressure today is stable and on repeat by me 122/70 on diltiazem 180 mg, metoprolol tartrate 50 mg twice a day.  Atrial fibrillation rate is well controlled in the 60s.  He continues to be on warfarin anticoagulation and denies any bleeding.  He is on atorvastatin 10 mg for hyperlipidemia.  He had repeat laboratory done by Dr. Isaac Bliss in February.  He never had a follow-up echo Doppler study prior to his office visit.  Clinically he is asymptomatic.  He tells me his primary care physician in the past and said he had heart failure.  He is euvolemic on exam.  He will continue with his current regimen.  I again have recommended a follow-up echo Doppler study and we will schedule this to be done in 6 months prior to his follow-up office visit.  Received his first Covid vaccination this week.  He had suffered polio at times has issues with post polio syndrome.  I will see him in 6 months for reevaluation.  Medication Adjustments/Labs and Tests Ordered: Current medicines are reviewed at length with the patient today.  Concerns regarding medicines are outlined above.  Medication changes, Labs and Tests ordered today are listed in the Patient Instructions below.  There are no Patient Instructions on file for this visit.  Signed, Shelva Majestic, MD, [facility]  01/14/2020 3:02 PM    [facility] [address] [place], [place], [place]  [address] Phone: [phone]

## 2020-01-14 NOTE — Patient Instructions (Signed)
Medication Instructions:  CONTINUE WITH CURRENT MEDICATIONS. NO CHANGES.  *If you need a refill on your cardiac medications before your next appointment, please call your pharmacy*   Testing/Procedures: in 6 months Your physician has requested that you have an echocardiogram. Echocardiography is a painless test that uses sound waves to create images of your heart. It provides your doctor with information about the size and shape of your heart and how well your heart's chambers and valves are working. This procedure takes approximately one hour. There are no restrictions for this procedure.  [address]   Follow-Up: At [facility], you and your health needs are our priority.  As part of our continuing mission to provide you with exceptional heart care, we have created designated Provider Care Teams.  These Care Teams include your primary Cardiologist (physician) and Advanced Practice Providers (APPs -  Physician Assistants and Nurse Practitioners) who all work together to provide you with the care you need, when you need it.  We recommend signing up for the patient portal called "MyChart".  Sign up information is provided on this After Visit Summary.  MyChart is used to connect with patients for Virtual Visits (Telemedicine).  Patients are able to view lab/test results, encounter notes, upcoming appointments, etc.  Non-urgent messages can be sent to your provider as well.   To learn more about what you can do with MyChart, go to [email or web address].    Your next appointment:   After your echocardiogram

## 2020-01-20 ENCOUNTER — Other Ambulatory Visit: Payer: Self-pay

## 2020-01-20 ENCOUNTER — Other Ambulatory Visit: Payer: Self-pay | Admitting: General Practice

## 2020-01-20 ENCOUNTER — Ambulatory Visit (INDEPENDENT_AMBULATORY_CARE_PROVIDER_SITE_OTHER): Payer: Medicare Other | Admitting: General Practice

## 2020-01-20 DIAGNOSIS — Z7901 Long term (current) use of anticoagulants: Secondary | ICD-10-CM | POA: Diagnosis not present

## 2020-01-20 LAB — POCT INR: INR: 1.5 — AB (ref 2.0–3.0)

## 2020-01-20 MED ORDER — WARFARIN SODIUM 5 MG PO TABS: ORAL_TABLET | ORAL | 1 refills | Status: DC

## 2020-01-20 NOTE — Progress Notes (Signed)
I have reviewed and agree with note, evaluation, plan.   Brailon Don, MD  

## 2020-01-20 NOTE — Patient Instructions (Addendum)
Pre visit review using our clinic review tool, if applicable. No additional management support is needed unless otherwise documented below in the visit note.  Take 1 tablet today, tomorrow and then change dosage and take 1/2 tablet all day except 1 tablet on Sunday Tuesdays and Thursdays.  Re-check in 2 weeks.

## 2020-01-29 ENCOUNTER — Other Ambulatory Visit (HOSPITAL_COMMUNITY): Payer: Medicare Other

## 2020-01-30 ENCOUNTER — Other Ambulatory Visit: Payer: Self-pay | Admitting: Family Medicine

## 2020-01-30 DIAGNOSIS — E781 Pure hyperglyceridemia: Secondary | ICD-10-CM

## 2020-02-01 ENCOUNTER — Other Ambulatory Visit: Payer: Self-pay | Admitting: Family Medicine

## 2020-02-01 DIAGNOSIS — E781 Pure hyperglyceridemia: Secondary | ICD-10-CM

## 2020-02-03 ENCOUNTER — Ambulatory Visit (INDEPENDENT_AMBULATORY_CARE_PROVIDER_SITE_OTHER): Payer: Medicare Other | Admitting: General Practice

## 2020-02-03 ENCOUNTER — Other Ambulatory Visit: Payer: Self-pay

## 2020-02-03 DIAGNOSIS — Z7901 Long term (current) use of anticoagulants: Secondary | ICD-10-CM

## 2020-02-03 LAB — POCT INR: INR: 1.8 — AB (ref 2.0–3.0)

## 2020-02-03 NOTE — Progress Notes (Signed)
I have reviewed and agree with note, evaluation, plan.   Stephen Hunter, MD  

## 2020-02-03 NOTE — Patient Instructions (Addendum)
Pre visit review using our clinic review tool, if applicable. No additional management support is needed unless otherwise documented below in the visit note.  Take 1 tablet today and then continue to take 1/2 tablet all day except 1 tablet on Sunday Tuesdays and Thursdays.  Re-check in 2 to 3 weeks.
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
## 2020-02-16 ENCOUNTER — Ambulatory Visit: Payer: Medicare Other | Admitting: Neurology

## 2020-02-16 ENCOUNTER — Encounter: Payer: Self-pay | Admitting: Neurology

## 2020-02-16 ENCOUNTER — Other Ambulatory Visit: Payer: Self-pay

## 2020-02-16 VITALS — BP 110/69 | HR 82 | Ht 69.0 in | Wt 182.8 lb

## 2020-02-16 DIAGNOSIS — I680 Cerebral amyloid angiopathy: Secondary | ICD-10-CM

## 2020-02-16 DIAGNOSIS — F1010 Alcohol abuse, uncomplicated: Secondary | ICD-10-CM | POA: Diagnosis not present

## 2020-02-16 DIAGNOSIS — F0150 Vascular dementia without behavioral disturbance: Secondary | ICD-10-CM | POA: Diagnosis not present

## 2020-02-16 DIAGNOSIS — F01A0 Vascular dementia, mild, without behavioral disturbance, psychotic disturbance, mood disturbance, and anxiety: Secondary | ICD-10-CM

## 2020-02-17 ENCOUNTER — Other Ambulatory Visit: Payer: Self-pay

## 2020-02-18 ENCOUNTER — Other Ambulatory Visit (INDEPENDENT_AMBULATORY_CARE_PROVIDER_SITE_OTHER): Payer: Medicare Other

## 2020-02-18 ENCOUNTER — Ambulatory Visit: Payer: Medicare Other | Admitting: Internal Medicine

## 2020-02-18 DIAGNOSIS — E559 Vitamin D deficiency, unspecified: Secondary | ICD-10-CM | POA: Diagnosis not present

## 2020-02-18 LAB — VITAMIN D 25 HYDROXY (VIT D DEFICIENCY, FRACTURES): VITD: 58.6 ng/mL (ref 30.00–100.00)

## 2020-02-22 ENCOUNTER — Telehealth: Payer: Self-pay | Admitting: Internal Medicine

## 2020-02-22 DIAGNOSIS — E781 Pure hyperglyceridemia: Secondary | ICD-10-CM

## 2020-02-22 NOTE — Telephone Encounter (Signed)
Medication: Atorvastatin  Ph[facility]: [facility]

## 2020-02-23 MED ORDER — ATORVASTATIN CALCIUM 10 MG PO TABS: 10.0000 mg | ORAL_TABLET | Freq: Every day | ORAL | 1 refills | Status: DC

## 2020-02-24 ENCOUNTER — Ambulatory Visit (INDEPENDENT_AMBULATORY_CARE_PROVIDER_SITE_OTHER): Payer: Medicare Other | Admitting: General Practice

## 2020-02-24 ENCOUNTER — Other Ambulatory Visit: Payer: Self-pay

## 2020-02-24 DIAGNOSIS — Z7901 Long term (current) use of anticoagulants: Secondary | ICD-10-CM

## 2020-02-24 LAB — POCT INR: INR: 3 (ref 2.0–3.0)

## 2020-02-24 NOTE — Patient Instructions (Addendum)
Pre visit review using our clinic review tool, if applicable. No additional management support is needed unless otherwise documented below in the visit note.  Continue to take 1/2 tablet all day except 1 tablet on Sunday Tuesdays and Thursdays.  Re-check in 4 weeks.

## 2020-02-24 NOTE — Progress Notes (Signed)
I have reviewed and agree with note, evaluation, plan.   Johndavid Geralds, MD  

## 2020-03-07 ENCOUNTER — Other Ambulatory Visit: Payer: Self-pay | Admitting: Internal Medicine

## 2020-03-07 DIAGNOSIS — E559 Vitamin D deficiency, unspecified: Secondary | ICD-10-CM

## 2020-03-09 IMAGING — MR MR HEAD W/O CM
10 series · 48 of 48 positions shown · non-contrast
Comparison: 09/05/2017

CLINICAL DATA: Episode of blacking out a few weeks ago. Pain in the
base of the skull posteriorly. History of prostate cancer.

EXAM:
MRI HEAD WITHOUT CONTRAST
TECHNIQUE: Multiplanar, multiecho pulse sequences of the brain and surrounding
structures were obtained without intravenous contrast.

[Series 2: T1 · sagittal · 5.0mm · 0.45mm/px · 2 of 21 slices shown]
[im 1/21]
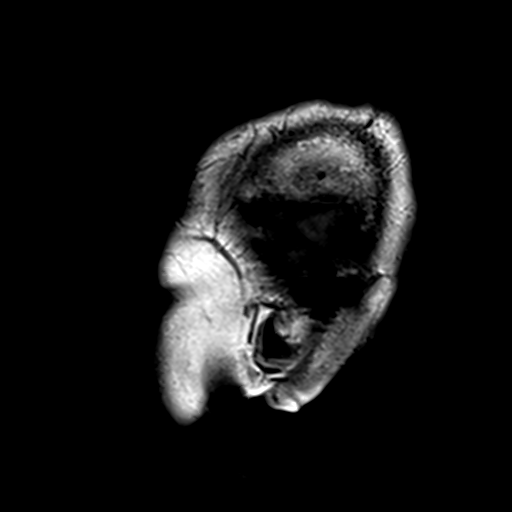
[im 21/21]
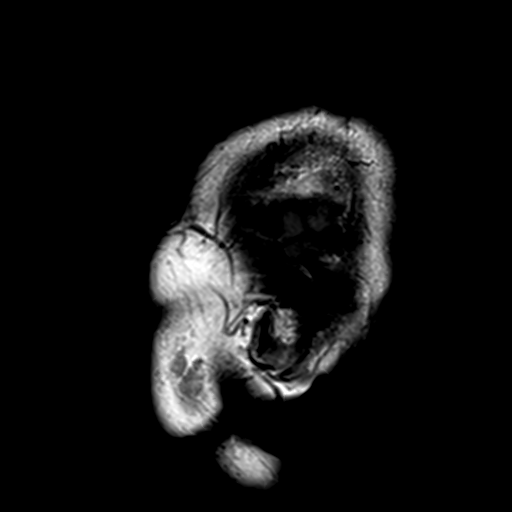

[Series 3: DWI · axial · 3.0mm · 1.80mm/px · z∈[-52,+101]mm · 9 of 102 slices shown (1 of 4)]
[im 1/102]
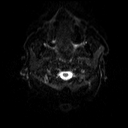
[im 13/102]
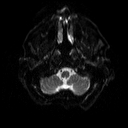
[im 26/102]
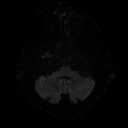
[im 38/102]
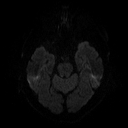
[im 51/102]
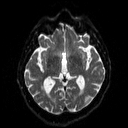
[im 64/102]
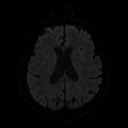
[im 76/102]
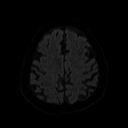
[im 89/102]
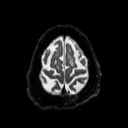
[im 102/102]
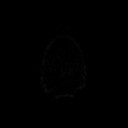

[Series 4: DWI · axial · 3.0mm · 1.80mm/px · z∈[-52,+101]mm · 5 of 51 slices shown (2 of 4)]
[im 1/51]
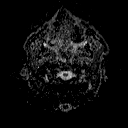
[im 13/51]
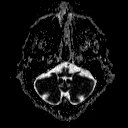
[im 26/51]
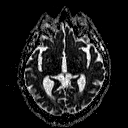
[im 38/51]
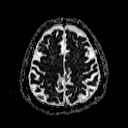
[im 51/51]
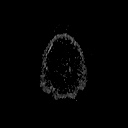

[Series 5: DWI · coronal · 5.0mm · 1.80mm/px · 6 of 68 slices shown (3 of 4)]
[im 1/68]
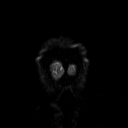
[im 14/68]
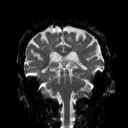
[im 27/68]
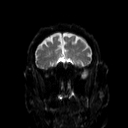
[im 41/68]
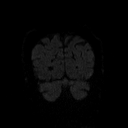
[im 54/68]
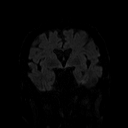
[im 68/68]
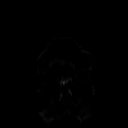

[Series 6: DWI · coronal · 5.0mm · 1.80mm/px · 3 of 34 slices shown (4 of 4)]
[im 1/34]
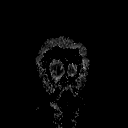
[im 17/34]
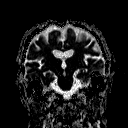
[im 34/34]
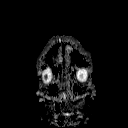

[Series 7: T2 · axial · 5.0mm · 0.60mm/px · z∈[-47,+99]mm · 2 of 22 slices shown (1 of 2)]
[im 1/22]
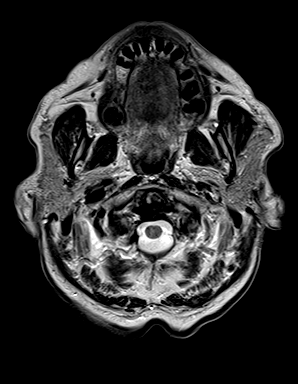
[im 22/22]
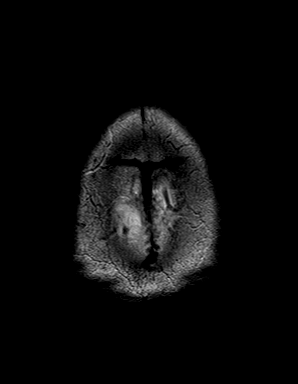

[Series 8: FLAIR · axial · 3.0mm · 0.45mm/px · z∈[-49,+99]mm · 3 of 33 slices shown]
[im 1/33]
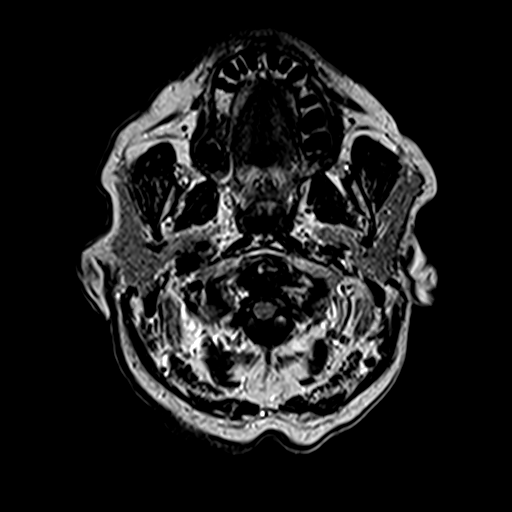
[im 17/33]
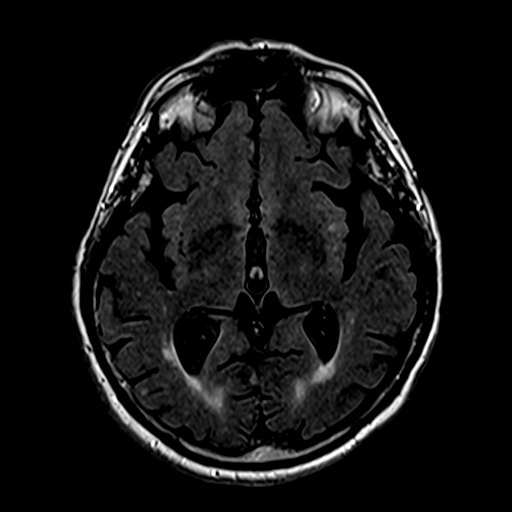
[im 33/33]
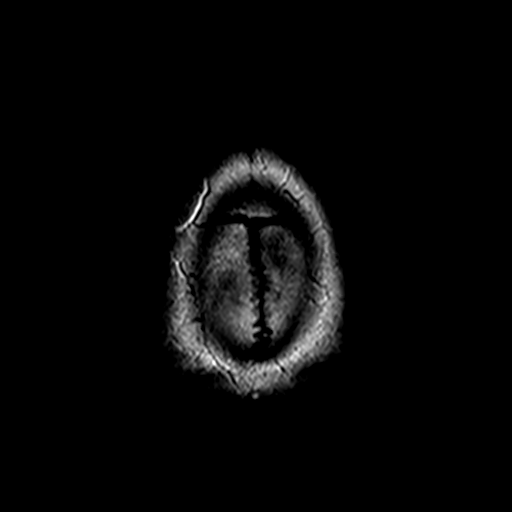

[Series 10: swi_images · axial · 4.0mm · 0.90mm/px · z∈[-45,+95]mm · 3 of 36 slices shown]
[im 1/36]
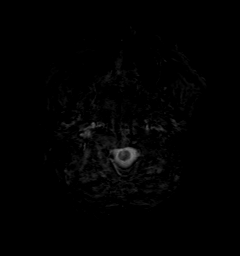
[im 18/36]
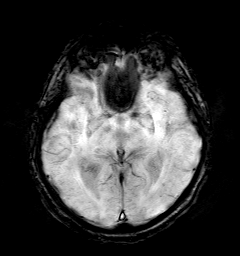
[im 36/36]
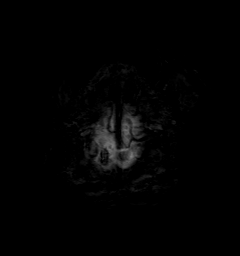

[Series 11: t1_mpr_tra · axial · 1.0mm · 0.71mm/px · z∈[-45,+98]mm · 13 of 144 slices shown]
[im 1/144]
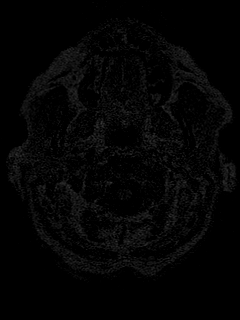
[im 12/144]
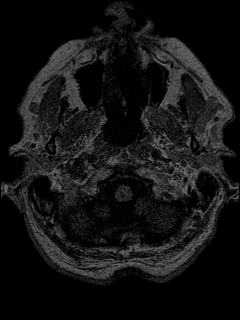
[im 24/144]
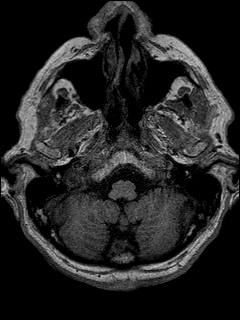
[im 36/144]
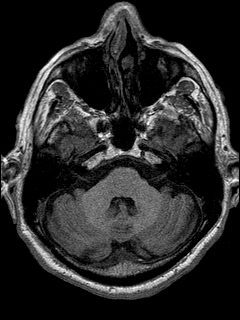
[im 48/144]
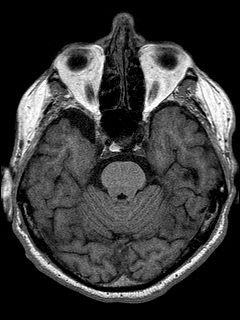
[im 60/144]
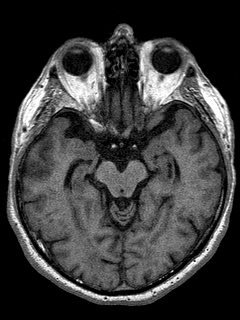
[im 72/144]
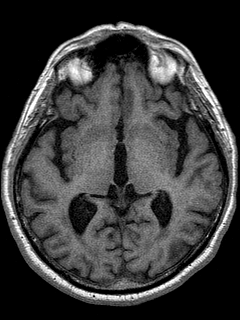
[im 84/144]
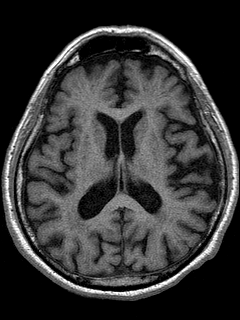
[im 96/144]
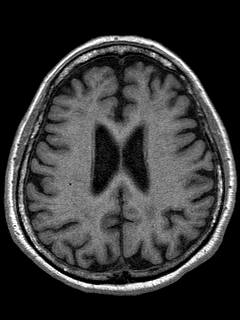
[im 108/144]
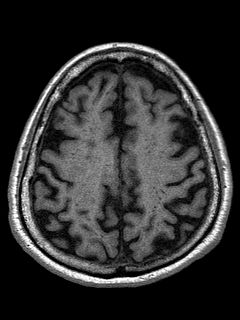
[im 120/144]
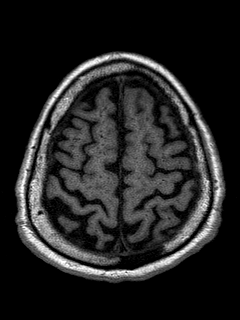
[im 132/144]
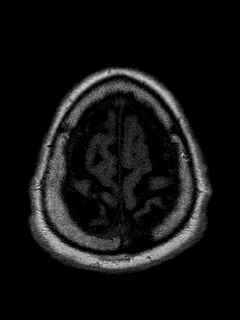
[im 144/144]
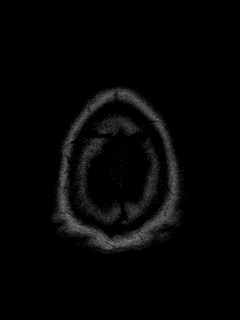

[Series 12: T2 · coronal · 5.0mm · 0.45mm/px · 2 of 25 slices shown (2 of 2)]
[im 1/25]
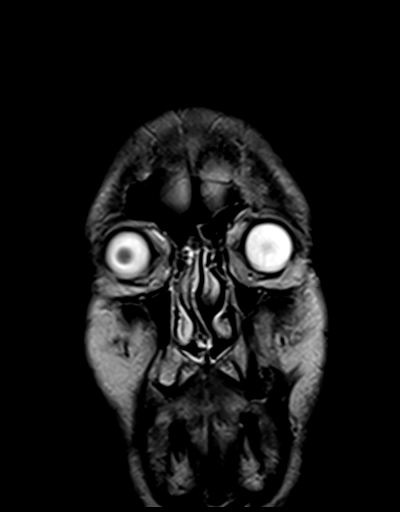
[im 25/25]
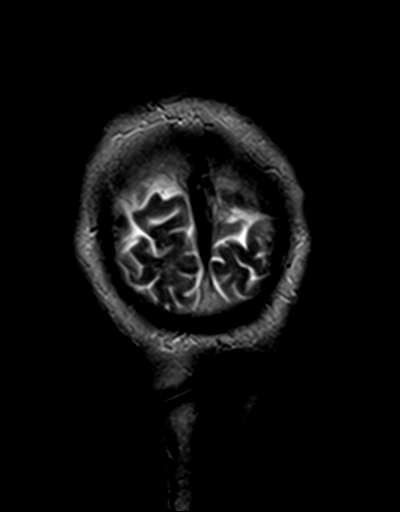

[48 of 48 positions shown; findings below may reference images not displayed]

FINDINGS: Brain: No acute infarct, mass, midline shift, or extra-axial fluid
collection is identified. Numerous chronic microhemorrhages in both
cerebral hemispheres are similar to the prior study and without
significant deep gray nuclei involvement. A few chronic
microhemorrhages are again noted in the posterior fossa as well.
Small foci of T2 hyperintensity in the cerebral white matter
bilaterally are unchanged and nonspecific but compatible with
mild-to-moderate chronic small vessel ischemic disease. There are
unchanged chronic lacunar infarcts in the left caudate nucleus.
There is mild generalized cerebral atrophy.

Vascular: Major intracranial vascular flow voids are preserved.

Skull and upper cervical spine: Unremarkable bone marrow signal.

Sinuses/Orbits: Unremarkable orbits. Paranasal sinuses and mastoid
air cells are clear.

Other: None.
IMPRESSION: 1. No acute intracranial abnormality.
2. Unchanged mild-to-moderate chronic small vessel ischemic disease
and numerous chronic microhemorrhages.

## 2020-03-23 ENCOUNTER — Other Ambulatory Visit: Payer: Self-pay

## 2020-03-23 ENCOUNTER — Ambulatory Visit (INDEPENDENT_AMBULATORY_CARE_PROVIDER_SITE_OTHER): Payer: Medicare Other | Admitting: General Practice

## 2020-03-23 DIAGNOSIS — Z7901 Long term (current) use of anticoagulants: Secondary | ICD-10-CM | POA: Diagnosis not present

## 2020-03-23 LAB — POCT INR: INR: 2.7 (ref 2.0–3.0)

## 2020-03-23 NOTE — Patient Instructions (Addendum)
Pre visit review using our clinic review tool, if applicable. No additional management support is needed unless otherwise documented below in the visit note.  Continue to take 1/2 tablet all day except 1 tablet on Sunday Tuesdays and Thursdays.  Re-check in 4 weeks.

## 2020-03-23 NOTE — Progress Notes (Signed)
I have reviewed and agree with note, evaluation, plan.   Zykeem Bauserman, MD  

## 2020-04-20 ENCOUNTER — Ambulatory Visit (INDEPENDENT_AMBULATORY_CARE_PROVIDER_SITE_OTHER): Payer: Medicare Other | Admitting: General Practice

## 2020-04-20 ENCOUNTER — Other Ambulatory Visit: Payer: Self-pay

## 2020-04-20 DIAGNOSIS — Z7901 Long term (current) use of anticoagulants: Secondary | ICD-10-CM | POA: Diagnosis not present

## 2020-04-20 LAB — POCT INR: INR: 1.9 — AB (ref 2.0–3.0)

## 2020-04-20 NOTE — Progress Notes (Signed)
I have reviewed the patient's encounter and agree with the documentation.  Marcus Galloway. Jerline Pain, MD 04/20/2020 11:27 AM

## 2020-04-20 NOTE — Patient Instructions (Signed)
Pre visit review using our clinic review tool, if applicable. No additional management support is needed unless otherwise documented below in the visit note.  Take 1 tablet today and then continue to take 1/2 tablet all day except 1 tablet on Sunday Tuesdays and Thursdays.  Re-check in 4 weeks.

## 2020-04-28 ENCOUNTER — Other Ambulatory Visit: Payer: Self-pay | Admitting: Cardiovascular Disease

## 2020-05-10 ENCOUNTER — Telehealth: Payer: Self-pay | Admitting: Neurology

## 2020-05-10 NOTE — Telephone Encounter (Signed)
Patient has had brain bleeds in the past and has been having bad headaches lately. He is concerned and would like to talk with someone for advice.

## 2020-05-10 NOTE — Telephone Encounter (Signed)
They may just be headaches but he would have to make an appointment for further evaluation.

## 2020-05-10 NOTE — Telephone Encounter (Signed)
Spoke with pt who states he has a history of brain bleeds. For the last 2-3 days he's had a knife-like stabbing pain intermittently at the back of his skull, will occur every few minutes for several hours at a time, no nausea/photophobia. Takes a daily ES Tylenol for arthritis but has taken no other pain medication. He is wanting to know if he should be seen by someone d/t history of brain bleeds or if he should be concerned. Told him I would pass information on to Dr Tomi Likens and return call with updates, he verbalized understanding.

## 2020-05-10 NOTE — Telephone Encounter (Signed)
Spoke with pt and informed of what Dr Tomi Likens said re headaches -   They may just be headaches but he would have to make an appointment for further evaluation.  Pt states he will contact his PCP and if she suggests he see Dr Tomi Likens for headache evaluation he will call for an appointment, he understands he can call back for any further concerns.

## 2020-05-25 ENCOUNTER — Ambulatory Visit (INDEPENDENT_AMBULATORY_CARE_PROVIDER_SITE_OTHER): Payer: Medicare Other | Admitting: General Practice

## 2020-05-25 ENCOUNTER — Other Ambulatory Visit: Payer: Self-pay

## 2020-05-25 DIAGNOSIS — Z7901 Long term (current) use of anticoagulants: Secondary | ICD-10-CM | POA: Diagnosis not present

## 2020-05-25 LAB — POCT INR: INR: 1.3 — AB (ref 2.0–3.0)

## 2020-05-25 NOTE — Progress Notes (Signed)
I have reviewed and agree with note, evaluation, plan.   Trezure Cronk, MD  

## 2020-05-25 NOTE — Patient Instructions (Addendum)
Pre visit review using our clinic review tool, if applicable. No additional management support is needed unless otherwise documented below in the visit note.  Take 1 tablet today (7/28) and take 1 1/2 tablets tomorrow (7/29) and take 1 tablet on Friday.  On Saturday change dosage and take 1 tablet daily except 1/2 tablet on Mon Wed and Fridays.  Re-check in 2 weeks.

## 2020-06-08 ENCOUNTER — Other Ambulatory Visit: Payer: Self-pay

## 2020-06-08 ENCOUNTER — Ambulatory Visit (INDEPENDENT_AMBULATORY_CARE_PROVIDER_SITE_OTHER): Payer: Medicare Other | Admitting: General Practice

## 2020-06-08 DIAGNOSIS — Z7901 Long term (current) use of anticoagulants: Secondary | ICD-10-CM | POA: Diagnosis not present

## 2020-06-08 LAB — POCT INR: INR: 1.6 — AB (ref 2.0–3.0)

## 2020-06-08 NOTE — Patient Instructions (Signed)
Pre visit review using our clinic review tool, if applicable. No additional management support is needed unless otherwise documented below in the visit note.  Take 1 tablet today (8/11) and take 1 1/2 tablets tomorrow (8/12).  Change dosage and take 1 tablet daily except 1/2 tablet on Mondays and Fridays.  Re-check in 2 weeks.

## 2020-06-20 ENCOUNTER — Encounter: Payer: Self-pay | Admitting: Neurology

## 2020-06-22 ENCOUNTER — Ambulatory Visit: Payer: Medicare Other

## 2020-06-22 NOTE — Progress Notes (Signed)
I have reviewed and agree with note, evaluation, plan.   Libbi Towner, MD  

## 2020-06-23 ENCOUNTER — Other Ambulatory Visit: Payer: Self-pay | Admitting: Internal Medicine

## 2020-06-24 ENCOUNTER — Other Ambulatory Visit: Payer: Self-pay | Admitting: Internal Medicine

## 2020-06-29 ENCOUNTER — Other Ambulatory Visit: Payer: Self-pay

## 2020-06-29 ENCOUNTER — Ambulatory Visit (INDEPENDENT_AMBULATORY_CARE_PROVIDER_SITE_OTHER): Payer: Medicare Other | Admitting: General Practice

## 2020-06-29 DIAGNOSIS — Z7901 Long term (current) use of anticoagulants: Secondary | ICD-10-CM | POA: Diagnosis not present

## 2020-06-29 LAB — POCT INR: INR: 3.2 — AB (ref 2.0–3.0)

## 2020-06-29 NOTE — Patient Instructions (Signed)
Pre visit review using our clinic review tool, if applicable. No additional management support is needed unless otherwise documented below in the visit note.  Skip dosage today and then continue to take 1 tablet daily except 1/2 tablet on Mondays and Fridays.  Re-check in 3 to 4 weeks.

## 2020-06-29 NOTE — Progress Notes (Signed)
I have reviewed and agree with note, evaluation, plan.   Cary Wilford, MD  

## 2020-06-30 ENCOUNTER — Other Ambulatory Visit (HOSPITAL_COMMUNITY): Payer: Medicare Other

## 2020-07-14 ENCOUNTER — Other Ambulatory Visit: Payer: Self-pay | Admitting: Internal Medicine

## 2020-07-14 DIAGNOSIS — Z7901 Long term (current) use of anticoagulants: Secondary | ICD-10-CM

## 2020-07-15 NOTE — Telephone Encounter (Signed)
Pt compliant with coumadin management. Sent in script 

## 2020-07-21 ENCOUNTER — Other Ambulatory Visit (HOSPITAL_COMMUNITY): Payer: Medicare Other

## 2020-07-24 ENCOUNTER — Other Ambulatory Visit: Payer: Self-pay | Admitting: Cardiovascular Disease

## 2020-07-27 ENCOUNTER — Ambulatory Visit: Payer: Medicare Other

## 2020-08-03 ENCOUNTER — Other Ambulatory Visit: Payer: Self-pay

## 2020-08-03 ENCOUNTER — Ambulatory Visit (INDEPENDENT_AMBULATORY_CARE_PROVIDER_SITE_OTHER): Payer: Medicare Other | Admitting: General Practice

## 2020-08-03 ENCOUNTER — Encounter: Payer: Self-pay | Admitting: Internal Medicine

## 2020-08-03 ENCOUNTER — Ambulatory Visit (INDEPENDENT_AMBULATORY_CARE_PROVIDER_SITE_OTHER): Payer: Medicare Other

## 2020-08-03 DIAGNOSIS — Z23 Encounter for immunization: Secondary | ICD-10-CM

## 2020-08-03 DIAGNOSIS — Z7901 Long term (current) use of anticoagulants: Secondary | ICD-10-CM | POA: Diagnosis not present

## 2020-08-03 LAB — POCT INR: INR: 1.9 — AB (ref 2.0–3.0)

## 2020-08-03 NOTE — Progress Notes (Signed)
I have reviewed and agree with note, evaluation, plan.   Chasitee Zenker, MD  

## 2020-08-03 NOTE — Patient Instructions (Signed)
Pre visit review using our clinic review tool, if applicable. No additional management support is needed unless otherwise documented below in the visit note.  Take 1 1/2 tablets today as a boost and the continue to take 1 tablet daily except 1/2 tablet on Mondays and Fridays.  Re-check in 3 to 4 weeks.

## 2020-08-04 ENCOUNTER — Ambulatory Visit (HOSPITAL_COMMUNITY): Payer: Medicare Other | Attending: Internal Medicine

## 2020-08-04 DIAGNOSIS — I4821 Permanent atrial fibrillation: Secondary | ICD-10-CM

## 2020-08-04 LAB — ECHOCARDIOGRAM COMPLETE
AR max vel: 1.01 cm2
AV Area VTI: 1.21 cm2
AV Area mean vel: 1.05 cm2
AV Mean grad: 10 mmHg
AV Peak grad: 21.3 mmHg
Ao pk vel: 2.31 m/s
Area-P 1/2: 5.02 cm2
S' Lateral: 3.3 cm

## 2020-08-17 ENCOUNTER — Other Ambulatory Visit: Payer: Self-pay | Admitting: Internal Medicine

## 2020-08-17 DIAGNOSIS — E781 Pure hyperglyceridemia: Secondary | ICD-10-CM

## 2020-08-31 ENCOUNTER — Ambulatory Visit (INDEPENDENT_AMBULATORY_CARE_PROVIDER_SITE_OTHER): Payer: Medicare Other | Admitting: General Practice

## 2020-08-31 ENCOUNTER — Other Ambulatory Visit: Payer: Self-pay

## 2020-08-31 DIAGNOSIS — Z7901 Long term (current) use of anticoagulants: Secondary | ICD-10-CM

## 2020-08-31 LAB — POCT INR: INR: 2.1 (ref 2.0–3.0)

## 2020-08-31 NOTE — Patient Instructions (Signed)
Pre visit review using our clinic review tool, if applicable. No additional management support is needed unless otherwise documented below in the visit note.  Continue to take 1 tablet daily except 1/2 tablet on Mondays and Fridays.  Re-check in 4 weeks.

## 2020-08-31 NOTE — Progress Notes (Signed)
I have reviewed and agree with note, evaluation, plan.   Lavance Beazer, MD  

## 2020-09-19 ENCOUNTER — Other Ambulatory Visit: Payer: Self-pay | Admitting: Internal Medicine

## 2020-09-28 ENCOUNTER — Other Ambulatory Visit: Payer: Self-pay

## 2020-09-28 ENCOUNTER — Ambulatory Visit (INDEPENDENT_AMBULATORY_CARE_PROVIDER_SITE_OTHER): Payer: Medicare Other | Admitting: General Practice

## 2020-09-28 DIAGNOSIS — Z7901 Long term (current) use of anticoagulants: Secondary | ICD-10-CM

## 2020-09-28 LAB — POCT INR: INR: 1.6 — AB (ref 2.0–3.0)

## 2020-09-28 NOTE — Progress Notes (Signed)
I have reviewed and agree with note, evaluation, plan.   Britian Jentz, MD  

## 2020-09-28 NOTE — Patient Instructions (Signed)
Pre visit review using our clinic review tool, if applicable. No additional management support is needed unless otherwise documented below in the visit note.  Take 1 1/2 tablets today and tomorrow and then continue to take 1 tablet daily except 1/2 tablet on Mondays and Fridays.  Re-check in 3 to 4 weeks.

## 2020-10-11 ENCOUNTER — Other Ambulatory Visit: Payer: Self-pay | Admitting: Internal Medicine

## 2020-10-11 DIAGNOSIS — Z7901 Long term (current) use of anticoagulants: Secondary | ICD-10-CM

## 2020-10-17 ENCOUNTER — Other Ambulatory Visit: Payer: Self-pay | Admitting: Internal Medicine

## 2020-10-19 ENCOUNTER — Ambulatory Visit (INDEPENDENT_AMBULATORY_CARE_PROVIDER_SITE_OTHER): Payer: Medicare Other | Admitting: General Practice

## 2020-10-19 ENCOUNTER — Other Ambulatory Visit: Payer: Self-pay

## 2020-10-19 DIAGNOSIS — Z7901 Long term (current) use of anticoagulants: Secondary | ICD-10-CM

## 2020-10-19 LAB — POCT INR: INR: 2 (ref 2.0–3.0)

## 2020-10-19 NOTE — Patient Instructions (Signed)
Pre visit review using our clinic review tool, if applicable. No additional management support is needed unless otherwise documented below in the visit note.  Continue to take 1 tablet daily except 1/2 tablet on Mondays and Fridays.  Re-check in 4 weeks.  

## 2020-10-19 NOTE — Progress Notes (Signed)
I have reviewed and agree with note, evaluation, plan.   Reyna Lorenzi, MD  

## 2020-10-20 ENCOUNTER — Other Ambulatory Visit: Payer: Self-pay | Admitting: Cardiovascular Disease

## 2020-11-09 ENCOUNTER — Telehealth: Payer: Self-pay | Admitting: Neurology

## 2020-11-09 ENCOUNTER — Ambulatory Visit (INDEPENDENT_AMBULATORY_CARE_PROVIDER_SITE_OTHER): Payer: Medicare Other | Admitting: General Practice

## 2020-11-09 ENCOUNTER — Other Ambulatory Visit: Payer: Self-pay

## 2020-11-09 DIAGNOSIS — Z7901 Long term (current) use of anticoagulants: Secondary | ICD-10-CM | POA: Diagnosis not present

## 2020-11-09 LAB — POCT INR: INR: 1.8 — AB (ref 2.0–3.0)

## 2020-11-09 NOTE — Patient Instructions (Addendum)
Pre visit review using our clinic review tool, if applicable. No additional management support is needed unless otherwise documented below in the visit note.  Take 1 1/2 tablets today (1/12) and then continue to take 1 tablet daily except 1/2 tablet on Mondays and Fridays.  Re-check in 4 weeks.  Please follow dosing on the calendar to assure you get the right dosage on a daily basis.

## 2020-11-09 NOTE — Telephone Encounter (Signed)
Please advise 

## 2020-11-09 NOTE — Telephone Encounter (Signed)
Patient called and said, "My daughter told me to call and report a bad fall I had last Wednesday (I think), I was brushing my teeth and then I fell. I could not get up and I laid there for 3 hours bleeding before I could get up. My daughter is mad because I didn't tell anyone until today. I'm just so embarrassed and I'm all bruised up. I may have broken a rib. I need to know what I should do at this point."

## 2020-11-09 NOTE — Progress Notes (Signed)
I have reviewed and agree with note, evaluation, plan.   Indiana Pechacek, MD  

## 2020-11-10 NOTE — Telephone Encounter (Signed)
Patient's daughter Tressie Ellis called in wanting to make sure Dr. Tomi Likens is aware that other than what he told us yesterday that his memory "is really getting bad". He had a stomach bug the week of Christmas that was so bad that he couldn't make it to the toilet. He kept trying to clean it up before it was over and kept going on himself. This lasted 3 weeks and refused to go to the doctor and even tell a doctor. He is compulsive about cleaning and it seems to be "out of control". He still drives even though they state not to. She stated he is very stubborn and does not like to accept help. 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 She worries he will not tell Dr. Tomi Likens the correct information at his appointment on 11/16/20 since she is unable to come with him. She just wants Dr. Tomi Likens to be aware of this. She does not need a call back unless Dr. Tomi Likens feels he needs to speak with her.

## 2020-11-10 NOTE — Telephone Encounter (Signed)
He should follow up with his PCP

## 2020-11-12 ENCOUNTER — Other Ambulatory Visit: Payer: Self-pay | Admitting: Internal Medicine

## 2020-11-12 DIAGNOSIS — E781 Pure hyperglyceridemia: Secondary | ICD-10-CM
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
## 2020-11-16 ENCOUNTER — Other Ambulatory Visit: Payer: Self-pay

## 2020-11-16 ENCOUNTER — Ambulatory Visit: Payer: Medicare Other | Admitting: Neurology

## 2020-11-16 ENCOUNTER — Encounter: Payer: Self-pay | Admitting: Neurology

## 2020-11-16 VITALS — BP 111/71 | HR 62 | Ht 70.0 in | Wt 181.2 lb

## 2020-11-16 DIAGNOSIS — I680 Cerebral amyloid angiopathy: Secondary | ICD-10-CM | POA: Diagnosis not present

## 2020-11-16 DIAGNOSIS — F01A0 Vascular dementia, mild, without behavioral disturbance, psychotic disturbance, mood disturbance, and anxiety: Secondary | ICD-10-CM

## 2020-11-16 DIAGNOSIS — F1010 Alcohol abuse, uncomplicated: Secondary | ICD-10-CM

## 2020-11-16 DIAGNOSIS — F0150 Vascular dementia without behavioral disturbance: Secondary | ICD-10-CM

## 2020-11-16 DIAGNOSIS — I999 Unspecified disorder of circulatory system: Secondary | ICD-10-CM

## 2020-11-16 DIAGNOSIS — R55 Syncope and collapse: Secondary | ICD-10-CM

## 2020-11-16 NOTE — Patient Instructions (Addendum)
1.  Check CT head. We have sent a referral to [facility] for your CT of the head and they will call you directly to schedule your appointment. They are located at [place]. If you need to contact them directly please call [phone].  2.  Will check routine EEG.  If unremarkable, will order 48 hour ambulatory EEG 3.  No driving 4.  Follow up in 9 months or sooner if needed.

## 2020-11-18 ENCOUNTER — Ambulatory Visit: Payer: Medicare Other | Admitting: Neurology

## 2020-11-28 ENCOUNTER — Other Ambulatory Visit: Payer: Medicare Other

## 2020-11-28 ENCOUNTER — Telehealth: Payer: Self-pay | Admitting: Neurology

## 2020-11-28 NOTE — Telephone Encounter (Signed)
We can do that

## 2020-11-28 NOTE — Telephone Encounter (Signed)
Patient's daughter, [name], called and wanted to know if Dr. Tomi Likens can send something to the [facility] specifically stating the patient should not be driving. He is still driving and she is concerned about safety to him and others.

## 2020-11-28 NOTE — Telephone Encounter (Signed)
Please advise thanks.

## 2020-11-30 NOTE — Telephone Encounter (Signed)
Tried to contact daughter to come by office and fill out form to mail to [facility]. No answer at [phone]

## 2020-12-01 NOTE — Telephone Encounter (Signed)
Patients daughter is coming by to sign her part, then we will mail to [facility].Sunday Corn has form on her desk.

## 2020-12-07 ENCOUNTER — Ambulatory Visit (INDEPENDENT_AMBULATORY_CARE_PROVIDER_SITE_OTHER): Payer: Medicare Other | Admitting: General Practice

## 2020-12-07 ENCOUNTER — Other Ambulatory Visit: Payer: Self-pay

## 2020-12-07 DIAGNOSIS — Z7901 Long term (current) use of anticoagulants: Secondary | ICD-10-CM

## 2020-12-07 LAB — POCT INR: INR: 1.8 — AB (ref 2.0–3.0)

## 2020-12-07 NOTE — Progress Notes (Signed)
I have reviewed and agree with note, evaluation, plan.   Stephen Hunter, MD  

## 2020-12-07 NOTE — Patient Instructions (Addendum)
Pre visit review using our clinic review tool, if applicable. No additional management support is needed unless otherwise documented below in the visit note.  Take 1 1/2 tablets today (2/9) and then change dosage and take 1 tablet daily except 1/2 tablet on Mondays.   Re-check in 4 weeks.  Please follow dosing on the calendar to assure you get the right dosage on a daily basis.

## 2020-12-07 NOTE — Telephone Encounter (Signed)
Patient's daughter called in wanting [place] to go ahead and send the [facility] paperwork without her signature.

## 2020-12-07 NOTE — Telephone Encounter (Signed)
Marcus Galloway will fax it off to [facility]

## 2020-12-12 ENCOUNTER — Other Ambulatory Visit: Payer: Self-pay | Admitting: Internal Medicine

## 2020-12-12 DIAGNOSIS — E781 Pure hyperglyceridemia: Secondary | ICD-10-CM

## 2020-12-14 ENCOUNTER — Other Ambulatory Visit: Payer: Self-pay | Admitting: Internal Medicine

## 2020-12-14 DIAGNOSIS — E781 Pure hyperglyceridemia: Secondary | ICD-10-CM

## 2020-12-15 ENCOUNTER — Ambulatory Visit: Payer: Medicare Other | Admitting: Cardiovascular Disease

## 2020-12-15 ENCOUNTER — Encounter: Payer: Self-pay | Admitting: Cardiovascular Disease

## 2020-12-15 ENCOUNTER — Other Ambulatory Visit: Payer: Self-pay

## 2020-12-15 DIAGNOSIS — Z952 Presence of prosthetic heart valve: Secondary | ICD-10-CM | POA: Diagnosis not present

## 2020-12-15 DIAGNOSIS — Z79899 Other long term (current) drug therapy: Secondary | ICD-10-CM | POA: Diagnosis not present

## 2020-12-15 DIAGNOSIS — Z9181 History of falling: Secondary | ICD-10-CM

## 2020-12-15 DIAGNOSIS — I10 Essential (primary) hypertension: Secondary | ICD-10-CM | POA: Diagnosis not present

## 2020-12-15 DIAGNOSIS — Z7901 Long term (current) use of anticoagulants: Secondary | ICD-10-CM | POA: Diagnosis not present

## 2020-12-15 DIAGNOSIS — I4821 Permanent atrial fibrillation: Secondary | ICD-10-CM

## 2020-12-15 DIAGNOSIS — E785 Hyperlipidemia, unspecified: Secondary | ICD-10-CM | POA: Diagnosis not present

## 2020-12-15 MED ORDER — DILTIAZEM HCL ER COATED BEADS 120 MG PO CP24: 120.0000 mg | ORAL_CAPSULE | Freq: Every day | ORAL | 1 refills | Status: DC

## 2020-12-15 NOTE — Patient Instructions (Signed)
Medication Instructions:  Decrease Cardizem to 120 mg   *If you need a refill on your cardiac medications before your next appointment, please call your pharmacy*    Follow-Up: At [facility], you and your health needs are our priority.  As part of our continuing mission to provide you with exceptional heart care, we have created designated Provider Care Teams.  These Care Teams include your primary Cardiologist (physician) and Advanced Practice Providers (APPs -  Physician Assistants and Nurse Practitioners) who all work together to provide you with the care you need, when you need it.  We recommend signing up for the patient portal called "MyChart".  Sign up information is provided on this After Visit Summary.  MyChart is used to connect with patients for Virtual Visits (Telemedicine).  Patients are able to view lab/test results, encounter notes, upcoming appointments, etc.  Non-urgent messages can be sent to your provider as well.   To learn more about what you can do with MyChart, go to [email or web address].    Your next appointment:   12 month(s)  The format for your next appointment:   In Person  Provider:   Shelva Majestic, MD   Other Instructions Follow up with primary care provider in 6-8 weeks for blood pressure check.  Call if any dizziness or concerns.

## 2020-12-15 NOTE — Progress Notes (Signed)
Cardiology Office Note    Date:  12/17/2020   ID:  Marcus Galloway, DOB 1941/03/21, MRN [id number]  PCP:  Isaac Bliss, Rayford Halsted, MD  Cardiologist:  Shelva Majestic, MD   No chief complaint on file.   History of Present Illness:  Marcus Galloway is a 80 y.o. male Caucasian male who moved to [place] from [place].  He established cardiology care with me in March 2018. I last saw him in March 2021.  He presents for an 11 month follow-up evaluation.  Mr. 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  He was found to be in atrial fibrillation on routine office visit in November 2017.  Rate control and anticoagulation were initiated at that time and ultimately underwent cardioversion.  He develop recurrent atrial fibrillation and underwent sotalol loading with repeat cardioversion.  An echo Doppler study had shown normal LV size and function with an EF of 50-55%, biatrial enlargement, mild-to-moderate MR, moderate TR, and a normal prosthetic aortic valve.  When last seen by his cardiologist, Dr. Kellie Simmering in  [facility] at [place] on 12/10/2016 , he was back in atrial fibrillation.  At that time, eliquis was discontinued as was sotalol and he was started on metoprolol tartrate 75 mg twice a day and Coumadin for anticoagulation.  He has subsequently moved to [place] and establish care with me.    His initial evaluation with me he denied any episodes of chest pain.  He reportedly had undergone cardiac catheterization prior to his valve replacement and his coronary arteries were clean.  Recently, he had noticed exertional dyspnea.  He is unaware of rapid heartbeats on his current dose of metoprolol.  He admits to adequate sleep.  He is unaware of awakening gasping for breath, and he believes his sleep is restorative.  When I initially saw him his  ECG revealed atrial fibrillation with ventricular rate in the 80s.  I recommended discontinuance of amlodipine and change to diltiazem CD 180 mg for improved rate control as well.  Blood pressure control.  I recommended a follow-up echo Doppler study, and this was done on 02/01/2017.  This revealed an ejection fraction of 60-65%.  He had normal wall motion without regional wall motion abnormalities.  His bioprosthetic aortic valve was well-seated, without obstruction and only with trivial regurgitation. He had severe biatrial enlargement.  There was mild pulmonary hypertension at 36 mm with mild TR.    When I saw him in September 2018 he had noticed some mild episodes of dizziness and also some vague chest wall discomfort.  He admitted to being sleepy during the day.  He typically goes to bed at 11 PM and wakes up at 7 AM.  Laboratory in May 2018 was stable.  His cholesterol was 151, LDL cholesterol 68, triglycerides 130, HDL 57.  He was taking atorvastatin 10 mg, metoprolol 75 mg twice a day, warfarin anticoagulation, Cartia XT 180 mg in addition to Flomax.  He was feeling significantly improved on diltiazem instead of amlodipine and I recommended slight reduction of metoprolol due to some transient episodes of mild dizziness.  I saw him for 2-year follow-up evaluation in September 2020.  Over the past several years he felt that he was been doing fairly well.  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  He remained free of chest pain.  He denies PND orthopnea.  He continues to be on warfarin without bleeding.  He continues to be on diltiazem 180 mg, metoprolol 50 mg twice a day and is on atorvastatin at 10 mg daily for hyperlipidemia.    I recommended he undergo a follow-up echo Doppler study prior to 48-monthfollow-up office visit.  He never the echocardiographic  evaluation done.  I last saw him in March 2021 and over the prior 6 months  continued to be stable.  He denied chest pain PND orthopnea, palpitations, presyncope or syncope.  Denies difficulty with sleep.  He continued to be on his medical therapy and had undergone follow-up laboratory by his primary physician in February.   He underwent a follow-up echo Doppler study in August 04, 2020 which showed normal LV function with EF of 55 to 60% with LVH.  His 25 mm bioprosthetic aortic valve was well-seated.  There was no aortic regurgitation.  He had severe biatrial enlargement.  His only, he feels well.  He had a fall approximately 1 month ago.  He was seen by neurology and EEG and CTs were negative.  Is only he denies any chest pain or shortness of breath.  He presents for evaluation.  Past Medical History:  Diagnosis Date  . Aortic valve disease   . Atrial fibrillation ([facility]   . BPH (benign prostatic hyperplasia)   . Chronic neck pain   . GERD (gastroesophageal reflux disease)   . History of colonic polyps   . Hyperlipidemia   . Hypertension   . Osteoarthritis   . Persistent atrial fibrillation ([facility]   . Polio   . Prostate cancer ([facility]     Past Surgical History:  Procedure Laterality Date  . 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  . CARDIOVERSION  09/2016  . HERNIA REPAIR  1990  . INSERTION PROSTATE RADIATION SEED  2002    Current Medications: Outpatient Medications Prior to Visit  Medication Sig Dispense Refill  . ascorbic acid (VITAMIN C) 500 MG tablet Take by mouth.    .Marland Kitchenatorvastatin (LIPITOR) 10 MG tablet TAKE ONE TABLET BY MOUTH DAILY 30 tablet 0  . metoprolol tartrate (LOPRESSOR) 50 MG tablet TAKE ONE TABLET BY MOUTH TWICE A DAY 180 tablet 0  . OMEPRAZOLE PO Take 20 mg by mouth daily.    . tamsulosin (FLOMAX) 0.4 MG CAPS capsule TAKE ONE CAPSULE BY MOUTH DAILY 90 capsule 0  . warfarin (COUMADIN) 5 MG tablet TAKE ONE TABLET BY MOUTH DAILY EXCEPT TAKE 1/2  TABLET ON MONDAY AND FRIDAY OR AS DIRECTED 90 tablet 1  . CARTIA XT 180 MG 24 hr capsule TAKE ONE CAPSULE BY MOUTH DAILY 90 capsule 0   No facility-administered medications prior to visit.     Allergies:   Patient has no known allergies.   Social History   Socioeconomic History  . Marital status: Widowed    Spouse name: Not on file  . Number of children: 4  . Years of education: 190 . Highest education level: Not on file  Occupational History  . Occupation: Retired  Tobacco Use  . Smoking status: Former Smoker    Packs/day: 3.00    Years: 30.00    Pack years: 90.00  . Smokeless tobacco: Never Used  . Tobacco comment: quit smoking 1980  Vaping Use  . Vaping Use: Never used  Substance and Sexual Activity  . Alcohol use: Yes    Alcohol/week: 1.0 standard drink    Types: 1 Glasses of wine per week    Comment: occasion  . Drug use: No  . Sexual activity: Not Currently  Other Topics Concern  . Not on file  Social History Narrative   Lives alone, recently moved back to [place] from [facility] to be closer to his children   Fist floor of apartment complex   Right-handed   Caffeine: 3 cups each morning   Social Determinants of Health   Financial Resource Strain: Not on file  Food Insecurity: Not on file  Transportation Needs: Not on file  Physical Activity: Not on file  Stress: Not on file  Social Connections: Not on file    Social history is notable in that he is widowed.  He is moved back here to be close to his children.  He had smoked remotely from age 73 until age 28 but quit in 61.  He has 4 children.  One daughter had undergone aortic valve replacement.  He has a half-sister but her whereabouts is unknown.  He drinks occasional alcohol with wine or scotch.   Family History:  He was adopted. Family history is unknown by patient.  However, his biological parents are deceased and he was able to find through records that his mother died at age 51 and his father died at age  38.  ROS General: Negative; No fevers, chills, or night sweats;  HEENT: Negative; No changes in vision or hearing, sinus congestion, difficulty swallowing Pulmonary: Negative; No cough, wheezing, shortness of breath, hemoptysis Cardiovascular: see HPI GI: History of GERD on omeprazole GU: He has a history of prostate CA and underwent radiation seeds in 2001. Musculoskeletal: Negative; no myalgias, joint pain , or weakness/  he had polio at age 17-6 and was in the hospital for 5 months. Hematologic/Oncology: Negative; no easy bruising, bleeding Endocrine: Negative; no heat/cold intolerance; no diabetes Neuro: Negative; no changes in balance, headaches Skin: Negative; No rashes or skin lesions Psychiatric: Negative; No behavioral problems, depression Sleep: Negative; No snoring, daytime sleepiness, hypersomnolence, bruxism, restless legs, hypnogognic hallucinations, no cataplexy Other comprehensive 14 point system review is negative.   PHYSICAL EXAM:   VS:  BP 124/76   Pulse 61   Ht '5\' 9"'  (1.753 m)   Wt 177 lb (80.3 kg)   SpO2 97%   BMI 26.14 kg/m     Repeat blood pressure by me was 102/70 supine and 100/70 standing  Wt Readings from Last 3 Encounters:  12/15/20 177 lb (80.3 kg)  11/16/20 181 lb 3.2 oz (82.2 kg)  02/16/20 182 lb 12.8 oz (82.9 kg)    General: Alert, oriented, no distress.  Skin: normal turgor, no rashes, warm and dry HEENT: Normocephalic, atraumatic. Pupils equal round and reactive to light; sclera anicteric; extraocular muscles intact;  Nose without nasal septal hypertrophy Mouth/Parynx benign; Mallinpatti scale 3 Neck: No JVD, no carotid bruits; normal carotid upstroke Lungs: clear to ausculatation and percussion; no wheezing or rales Chest wall: without tenderness to palpitation Heart: PMI not displaced, irregularly irregular consistent with atrial fibrillation with a controlled ventricular response, s1 s2 normal, 1-9/1 systolic murmur, no diastolic murmur,  no rubs, gallops, thrills, or heaves Abdomen: soft, nontender; no hepatosplenomehaly, BS+; abdominal aorta nontender and not dilated by palpation. Back: no CVA tenderness Pulses 2+ Musculoskeletal: full range of motion, normal strength, no joint deformities Extremities: no clubbing cyanosis or edema, Homan's sign negative  Neurologic: grossly nonfocal; Cranial nerves grossly wnl Psychologic: Normal mood and affect   Studies/Labs Reviewed:   ECG (independently read by me):Atrial fibrillation at 75; LAHB, QS V1-30 December 2019 ECG (independently read by me): Atrial fibrillation with controlled ventricular rate at 66 bpm.  QS complex V1 through V3.  QTc interval 419 ms.     September 2020 ECG (independently read by me): Atrial fibrillation at 73 bpm.  Nonspecific ST changes.  QS complex V1 through V3.  07/23/2017 ECG (independently read by me): Atrial fibrillation at 73 bpm.  Left axis deviation.  September 2018 EKG:  EKG is ordered today. Atrial fibrillation with a slow ventricular response with ventricular rate at 47 bpm.  QRS complex V1 to V3.  Left axis deviation.  QTc interval 35 ms.  May 2018 ECG (independently read by me): Atrial fibrillation at 74 bpm.  Left anterior hemiblock.  QRS complex V1 and V2.  Nonspecific ST changes.  QTc interval 461 ms.  01/22/2017 ECG (independently read by me): Atrial fibrillation at a rate of 84 bpm.  Left anterior hemiblock.  Poor progression V1 through V3.  No significant ST segment changes.  Recent Labs: BMP Latest Ref Rng & Units 09/08/2019 07/28/2018 02/26/2017  Glucose 70 - 99 mg/dL 104(H) 93 101(H)  BUN 6 - 23 mg/dL '12 12 8  ' Creatinine 0.40 - 1.50 mg/dL 0.63 0.81 0.78  Sodium 135 - 145 mEq/L 140 141 142  Potassium 3.5 - 5.1 mEq/L 4.5 4.2 4.4  Chloride 96 - 112 mEq/L 103 104 104  CO2 19 - 32 mEq/L '30 30 26  ' Calcium 8.4 - 10.5 mg/dL 9.2 9.1 9.1     Hepatic Function Latest Ref Rng & Units 09/08/2019 07/28/2018 02/26/2017  Total Protein 6.0 - 8.3  g/dL 6.5 6.2 6.4  Albumin 3.5 - 5.2 g/dL 4.3 4.0 4.1  AST 0 - 37 U/L '29 21 25  ' ALT 0 - 53 U/L '27 20 21  ' Alk Phosphatase 39 - 117 U/L 100 90 90  Total Bilirubin 0.2 - 1.2 mg/dL 2.0(H) 0.9 0.8    CBC Latest Ref Rng & Units 09/08/2019 07/28/2018 02/26/2017  WBC 4.0 - 10.5 K/uL 6.7 6.9 7.2  Hemoglobin 13.0 - 17.0 g/dL 14.1 13.5 14.5  Hematocrit 39.0 - 52.0 % 42.1 39.1 43.7  Platelets 150.0 - 400.0 K/uL 210.0 194.0 211   Lab Results  Component Value Date   MCV 96.4 09/08/2019   MCV 88.7 07/28/2018   MCV 94.0 02/26/2017   Lab Results  Component Value Date   TSH 2.08 09/08/2019   Lab Results  Component Value Date   HGBA1C 5.4 09/08/2019     BNP No results found for: BNP  ProBNP No results found for: PROBNP   Lipid Panel     Component Value Date/Time   CHOL 146 09/08/2019 1122   TRIG 82.0 09/08/2019 1122   HDL 66.00 09/08/2019 1122   CHOLHDL 2 09/08/2019 1122   VLDL 16.4 09/08/2019 1122   LDLCALC 63 09/08/2019 1122     RADIOLOGY: No results found.   Additional studies/ records that were reviewed today include:  I reviewed the office records from Dr. Bunnie Philips at  [facility] in [place], [place].  The echo was reviewed    ASSESSMENT:    1. Essential hypertension   2. S/P aortic valve replacement   3. Permanent atrial fibrillation ([place])   4. Anticoagulation adequate   5. History of recent fall   6. Medication management  7. Hyperlipidemia, unspecified hyperlipidemia type     PLAN:  Mr. Ismaeel Arvelo is an 80 year old gentleman who is unaware of his family history as he was abandoned by his biologic parents and sent to a foster home.  He underwent aortic valve replacement surgery in December 2013 for which I presume was aortic stenosis and had a 25 mm Edwards pericardial valve inserted.  At the time of his surgery, he had normal coronary arteries.  He is now in permanent atrial fibrillation having undergone 4 attempts at  cardioversion with recurrent atrial fibrillation and is on warfarin anticoagulation.  His 2-D echo Doppler study from 02/01/2017 demonstrated mild LVH with normal systolic function.  His bioprosthetic aortic valve was well-seated without stenosis and with only trivial AR. There severe biatrial enlargement undoubtedly contributing to his recurrent atrial fibrillation.  There is mild pulmonary  hypertension.  Last saw him, he underwent a 3-year follow-up echo Doppler study in August 04, 2020.  I reviewed this with him in detail today.  He continues to show normal LV function with EF at 55 to 60%, moderate LVH, needed 25 mm bioprosthetic aortic valve.  There is no aortic regurgitation.  He continues to have severe biatrial enlargement.  He had normal pulmonary artery systolic pressure.  Recently had experienced a fall.  He has been on Cartia XT 180 mg daily in addition to metoprolol tartrate 50 mg twice a day for rate control of his atrial fibrillation.  His atrial fibrillation rate is well controlled; however, his blood pressure is low.  I have recommended reducing his Cartia XT dose from 180 mg down to 120 mg daily.  Continues to be on warfarin for anticoagulation.  He continues to be on atorvastatin for hyperlipidemia.  He will be following up with his primary physician Dr. Narda Bonds in 6 to 8 weeks at which time laboratory will be rechecked.  Additional medication adjustment may be necessary depending upon his blood pressure with orthostatic assessment.   Medication Adjustments/Labs and Tests Ordered: Current medicines are reviewed at length with the patient today.  Concerns regarding medicines are outlined above.  Medication changes, Labs and Tests ordered today are listed in the Patient Instructions below.  Patient Instructions  Medication Instructions:  Decrease Cardizem to 120 mg   *If you need a refill on your cardiac medications before your next appointment, please call your  pharmacy*    Follow-Up: At [facility], you and your health needs are our priority.  As part of our continuing mission to provide you with exceptional heart care, we have created designated Provider Care Teams.  These Care Teams include your primary Cardiologist (physician) and Advanced Practice Providers (APPs -  Physician Assistants and Nurse Practitioners) who all work together to provide you with the care you need, when you need it.  We recommend signing up for the patient portal called "MyChart".  Sign up information is provided on this After Visit Summary.  MyChart is used to connect with patients for Virtual Visits (Telemedicine).  Patients are able to view lab/test results, encounter notes, upcoming appointments, etc.  Non-urgent messages can be sent to your provider as well.   To learn more about what you can do with MyChart, go to [email or web address].    Your next appointment:   12 month(s)  The format for your next appointment:   In Person  Provider:   Shelva Majestic, MD   Other Instructions Follow up with primary care provider in 6-8 weeks for blood pressure check.  Call if any dizziness or concerns.    Signed, Shelva Majestic, MD, [name]  12/17/2020 10:41 AM    [place] [address], [place], [place], [place]  [address] Phone: [phone]

## 2020-12-17 ENCOUNTER — Encounter: Payer: Self-pay | Admitting: Cardiovascular Disease

## 2021-01-04 ENCOUNTER — Other Ambulatory Visit: Payer: Self-pay

## 2021-01-04 ENCOUNTER — Ambulatory Visit (INDEPENDENT_AMBULATORY_CARE_PROVIDER_SITE_OTHER): Payer: Medicare Other | Admitting: General Practice

## 2021-01-04 DIAGNOSIS — Z7901 Long term (current) use of anticoagulants: Secondary | ICD-10-CM

## 2021-01-04 LAB — POCT INR: INR: 5.1 — AB (ref 2.0–3.0)

## 2021-01-04 NOTE — Progress Notes (Signed)
I have reviewed and agree with note, evaluation, plan.   Pravin Perezperez, MD  

## 2021-01-04 NOTE — Patient Instructions (Addendum)
Pre visit review using our clinic review tool, if applicable. No additional management support is needed unless otherwise documented below in the visit note.  Skip dosage today and tomorrow and Friday.   Saturday and Sunday take 1 tablet Monday take 1/2 tablet Tuesday take 1 tablet Re-check INR on Wednesday

## 2021-01-08 ENCOUNTER — Other Ambulatory Visit: Payer: Self-pay | Admitting: Internal Medicine

## 2021-01-08 DIAGNOSIS — E781 Pure hyperglyceridemia: Secondary | ICD-10-CM

## 2021-01-09 ENCOUNTER — Other Ambulatory Visit: Payer: Self-pay | Admitting: Family Medicine

## 2021-01-11 ENCOUNTER — Ambulatory Visit (INDEPENDENT_AMBULATORY_CARE_PROVIDER_SITE_OTHER): Payer: Medicare Other | Admitting: General Practice

## 2021-01-11 ENCOUNTER — Other Ambulatory Visit: Payer: Self-pay

## 2021-01-11 ENCOUNTER — Other Ambulatory Visit: Payer: Self-pay | Admitting: Internal Medicine

## 2021-01-11 DIAGNOSIS — Z7901 Long term (current) use of anticoagulants: Secondary | ICD-10-CM

## 2021-01-11 LAB — POCT INR: INR: 2.7 (ref 2.0–3.0)

## 2021-01-11 NOTE — Patient Instructions (Signed)
Pre visit review using our clinic review tool, if applicable. No additional management support is needed unless otherwise documented below in the visit note.  Please take 1 tablet daily except 1/2 tablet on Monday and Friday.  Re-check 1 week.  **Wednesday and Thursday - 1 tablet Friday - 1/2 tablet Saturday and Sunday - 1 tablet Monday- 1/2 tablet

## 2021-01-11 NOTE — Progress Notes (Signed)
I have reviewed and agree with note, evaluation, plan.   Shloima Clinch, MD  

## 2021-01-18 ENCOUNTER — Ambulatory Visit (INDEPENDENT_AMBULATORY_CARE_PROVIDER_SITE_OTHER): Payer: Medicare Other | Admitting: General Practice

## 2021-01-18 ENCOUNTER — Other Ambulatory Visit: Payer: Self-pay

## 2021-01-18 DIAGNOSIS — Z7901 Long term (current) use of anticoagulants: Secondary | ICD-10-CM | POA: Diagnosis not present

## 2021-01-18 LAB — POCT INR: INR: 3.3 — AB (ref 2.0–3.0)

## 2021-01-18 NOTE — Progress Notes (Signed)
I have reviewed and agree with note, evaluation, plan.   Zyan Coby, MD  

## 2021-01-18 NOTE — Patient Instructions (Addendum)
Pre visit review using our clinic review tool, if applicable. No additional management support is needed unless otherwise documented below in the visit note.  Skip dosage today and then change dosage and take 1 tablet daily except 1/2 tablet on Mon Wed and Friday.  Re-check in 1 week.

## 2021-01-19 ENCOUNTER — Other Ambulatory Visit: Payer: Self-pay | Admitting: Internal Medicine

## 2021-01-25 ENCOUNTER — Telehealth: Payer: Self-pay | Admitting: Internal Medicine

## 2021-01-25 ENCOUNTER — Ambulatory Visit (INDEPENDENT_AMBULATORY_CARE_PROVIDER_SITE_OTHER): Payer: Medicare Other | Admitting: General Practice

## 2021-01-25 ENCOUNTER — Other Ambulatory Visit: Payer: Self-pay

## 2021-01-25 DIAGNOSIS — Z7901 Long term (current) use of anticoagulants: Secondary | ICD-10-CM | POA: Diagnosis not present

## 2021-01-25 DIAGNOSIS — E781 Pure hyperglyceridemia: Secondary | ICD-10-CM

## 2021-01-25 LAB — POCT INR: INR: 1.7 — AB (ref 2.0–3.0)

## 2021-01-25 MED ORDER — ATORVASTATIN CALCIUM 10 MG PO TABS: 10.0000 mg | ORAL_TABLET | Freq: Every day | ORAL | 0 refills | Status: DC

## 2021-01-25 NOTE — Patient Instructions (Addendum)
Pre visit review using our clinic review tool, if applicable. No additional management support is needed unless otherwise documented below in the visit note.  Change dosage and take 1 tablet daily except 1/2 tablet on Monday and Friday.  Re-check in 3 weeks.

## 2021-01-25 NOTE — Telephone Encounter (Signed)
Patient is calling and is requesting a refill for atorvastatin (LIPITOR) 10 MG tablet to be sent to   [place] [facility], [place]  [address] [place] [address]  Phone:  [phone] Fax:  [phone]   Pt has an appointment on 4/14 for a physical, please advise, CB is [phone]

## 2021-01-25 NOTE — Progress Notes (Signed)
I have reviewed and agree with note, evaluation, plan.   Jamarr Treinen, MD  

## 2021-02-09 ENCOUNTER — Other Ambulatory Visit: Payer: Self-pay

## 2021-02-09 ENCOUNTER — Ambulatory Visit (INDEPENDENT_AMBULATORY_CARE_PROVIDER_SITE_OTHER): Payer: Medicare Other | Admitting: Internal Medicine

## 2021-02-09 VITALS — BP 110/80 | HR 90 | Temp 98.0°F | Ht 66.5 in | Wt 176.1 lb

## 2021-02-09 DIAGNOSIS — I509 Heart failure, unspecified: Secondary | ICD-10-CM

## 2021-02-09 DIAGNOSIS — Z952 Presence of prosthetic heart valve: Secondary | ICD-10-CM

## 2021-02-09 DIAGNOSIS — I10 Essential (primary) hypertension: Secondary | ICD-10-CM | POA: Diagnosis not present

## 2021-02-09 DIAGNOSIS — E785 Hyperlipidemia, unspecified: Secondary | ICD-10-CM

## 2021-02-09 DIAGNOSIS — E781 Pure hyperglyceridemia: Secondary | ICD-10-CM | POA: Diagnosis not present

## 2021-02-09 DIAGNOSIS — R419 Unspecified symptoms and signs involving cognitive functions and awareness: Secondary | ICD-10-CM

## 2021-02-09 DIAGNOSIS — Z0000 Encounter for general adult medical examination without abnormal findings: Secondary | ICD-10-CM | POA: Diagnosis not present

## 2021-02-09 DIAGNOSIS — I4819 Other persistent atrial fibrillation: Secondary | ICD-10-CM

## 2021-02-09 DIAGNOSIS — Z8612 Personal history of poliomyelitis: Secondary | ICD-10-CM

## 2021-02-09 LAB — VITAMIN B12: Vitamin B-12: 105 pg/mL — ABNORMAL LOW (ref 211–911)

## 2021-02-09 LAB — COMPREHENSIVE METABOLIC PANEL
ALT: 18 U/L (ref 0–53)
AST: 22 U/L (ref 0–37)
Albumin: 3.8 g/dL (ref 3.5–5.2)
Alkaline Phosphatase: 66 U/L (ref 39–117)
BUN: 10 mg/dL (ref 6–23)
CO2: 30 mEq/L (ref 19–32)
Calcium: 9.1 mg/dL (ref 8.4–10.5)
Chloride: 102 mEq/L (ref 96–112)
Creatinine, Ser: 0.79 mg/dL (ref 0.40–1.50)
GFR: 83.78 mL/min (ref >60.00)
Glucose, Bld: 86 mg/dL (ref 70–99)
Potassium: 4.6 mEq/L (ref 3.5–5.1)
Sodium: 139 mEq/L (ref 135–145)
Total Bilirubin: 1.6 mg/dL — ABNORMAL HIGH (ref 0.2–1.2)
Total Protein: 6 g/dL (ref 6.0–8.3)

## 2021-02-09 LAB — LIPID PANEL
Cholesterol: 121 mg/dL (ref 0–200)
HDL: 60.7 mg/dL (ref >39.00)
LDL Cholesterol: 47 mg/dL (ref 0–99)
NonHDL: 60.35
Total CHOL/HDL Ratio: 2
Triglycerides: 68 mg/dL (ref 0.0–149.0)
VLDL: 13.6 mg/dL (ref 0.0–40.0)

## 2021-02-09 LAB — CBC WITH DIFFERENTIAL/PLATELET
Basophils Absolute: 0.1 10*3/uL (ref 0.0–0.1)
Basophils Relative: 1.1 % (ref 0.0–3.0)
Eosinophils Absolute: 0 10*3/uL (ref 0.0–0.7)
Eosinophils Relative: 0.7 % (ref 0.0–5.0)
HCT: 42.9 % (ref 39.0–52.0)
Hemoglobin: 14.7 g/dL (ref 13.0–17.0)
Lymphocytes Relative: 22.9 % (ref 12.0–46.0)
Lymphs Abs: 1.2 10*3/uL (ref 0.7–4.0)
MCHC: 34.2 g/dL (ref 30.0–36.0)
MCV: 96 fl (ref 78.0–100.0)
Monocytes Absolute: 0.4 10*3/uL (ref 0.1–1.0)
Monocytes Relative: 6.6 % (ref 3.0–12.0)
Neutro Abs: 3.7 10*3/uL (ref 1.4–7.7)
Neutrophils Relative %: 68.7 % (ref 43.0–77.0)
Platelets: 165 10*3/uL (ref 150.0–400.0)
RBC: 4.47 Mil/uL (ref 4.22–5.81)
RDW: 13.4 % (ref 11.5–15.5)
WBC: 5.3 10*3/uL (ref 4.0–10.5)

## 2021-02-09 LAB — TSH: TSH: 1.91 u[IU]/mL (ref 0.35–4.50)

## 2021-02-09 LAB — HEMOGLOBIN A1C: Hgb A1c MFr Bld: 5.4 % (ref 4.6–6.5)

## 2021-02-09 LAB — VITAMIN D 25 HYDROXY (VIT D DEFICIENCY, FRACTURES): VITD: 34.63 ng/mL (ref 30.00–100.00)

## 2021-02-09 MED ORDER — ATORVASTATIN CALCIUM 10 MG PO TABS: 10.0000 mg | ORAL_TABLET | Freq: Every day | ORAL | 1 refills | Status: DC

## 2021-02-09 NOTE — Patient Instructions (Signed)
-Nice seeing you today!!  -Lab work today; will notify you once results are available.  -Remember: tetanus, shingles and 4th COVID vaccine.  -Schedule follow up in 1 year or sooner as needed.   Preventive Care 81 Years and Older, Male Preventive care refers to lifestyle choices and visits with your health care provider that can promote health and wellness. This includes:  A yearly physical exam. This is also called an annual wellness visit.  Regular dental and eye exams.  Immunizations.  Screening for certain conditions.  Healthy lifestyle choices, such as: ? Eating a healthy diet. ? Getting regular exercise. ? Not using drugs or products that contain nicotine and tobacco. ? Limiting alcohol use. What can I expect for my preventive care visit? Physical exam Your health care provider will check your:  Height and weight. These may be used to calculate your BMI (body mass index). BMI is a measurement that tells if you are at a healthy weight.  Heart rate and blood pressure.  Body temperature.  Skin for abnormal spots. Counseling Your health care provider may ask you questions about your:  Past medical problems.  Family's medical history.  Alcohol, tobacco, and drug use.  Emotional well-being.  Home life and relationship well-being.  Sexual activity.  Diet, exercise, and sleep habits.  History of falls.  Memory and ability to understand (cognition).  Work and work Statistician.  Access to firearms. What immunizations do I need? Vaccines are usually given at various ages, according to a schedule. Your health care provider will recommend vaccines for you based on your age, medical history, and lifestyle or other factors, such as travel or where you work.   What tests do I need? Blood tests  Lipid and cholesterol levels. These may be checked every 5 years, or more often depending on your overall health.  Hepatitis C test.  Hepatitis B  test. Screening  Lung cancer screening. You may have this screening every year starting at age 15 if you have a 30-pack-year history of smoking and currently smoke or have quit within the past 15 years.  Colorectal cancer screening. ? All adults should have this screening starting at age 51 and continuing until age 18. ? Your health care provider may recommend screening at age 92 if you are at increased risk. ? You will have tests every 1-10 years, depending on your results and the type of screening test.  Prostate cancer screening. Recommendations will vary depending on your family history and other risks.  Genital exam to check for testicular cancer or hernias.  Diabetes screening. ? This is done by checking your blood sugar (glucose) after you have not eaten for a while (fasting). ? You may have this done every 1-3 years.  Abdominal aortic aneurysm (AAA) screening. You may need this if you are a current or former smoker.  STD (sexually transmitted disease) testing, if you are at risk. Follow these instructions at home: Eating and drinking  Eat a diet that includes fresh fruits and vegetables, whole grains, lean protein, and low-fat dairy products. Limit your intake of foods with high amounts of sugar, saturated fats, and salt.  Take vitamin and mineral supplements as recommended by your health care provider.  Do not drink alcohol if your health care provider tells you not to drink.  If you drink alcohol: ? Limit how much you have to 0-2 drinks a day. ? Be aware of how much alcohol is in your drink. In the [place] one drink equals  one 12 oz bottle of beer (355 mL), one 5 oz glass of wine (148 mL), or one 1 oz glass of hard liquor (44 mL).   Lifestyle  Take daily care of your teeth and gums. Brush your teeth every morning and night with fluoride toothpaste. Floss one time each day.  Stay active. Exercise for at least 30 minutes 5 or more days each week.  Do not use any products  that contain nicotine or tobacco, such as cigarettes, e-cigarettes, and chewing tobacco. If you need help quitting, ask your health care provider.  Do not use drugs.  If you are sexually active, practice safe sex. Use a condom or other form of protection to prevent STIs (sexually transmitted infections).  Talk with your health care provider about taking a low-dose aspirin or statin.  Find healthy ways to cope with stress, such as: ? Meditation, yoga, or listening to music. ? Journaling. ? Talking to a trusted person. ? Spending time with friends and family. Safety  Always wear your seat belt while driving or riding in a vehicle.  Do not drive: ? If you have been drinking alcohol. Do not ride with someone who has been drinking. ? When you are tired or distracted. ? While texting.  Wear a helmet and other protective equipment during sports activities.  If you have firearms in your house, make sure you follow all gun safety procedures. What's next?  Visit your health care provider once a year for an annual wellness visit.  Ask your health care provider how often you should have your eyes and teeth checked.  Stay up to date on all vaccines. This information is not intended to replace advice given to you by your health care provider. Make sure you discuss any questions you have with your health care provider. Document Revised: 07/14/2019 Document Reviewed: 10/09/2018 Elsevier Patient Education  2021 [employer].

## 2021-02-09 NOTE — Progress Notes (Signed)
Established Patient Office Visit     This visit occurred during the SARS-CoV-2 public health emergency.  Safety protocols were in place, including screening questions prior to the visit, additional usage of staff PPE, and extensive cleaning of exam room while observing appropriate contact time as indicated for disinfecting solutions.    CC/Reason for Visit: Annual preventive exam and subsequent Medicare wellness visit  HPI: Marcus Galloway is a 80 y.o. male who is coming in today for the above mentioned reasons. 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  I have not seen him since November 2020.  He is here with his daughter today.  He is not interested in making appointments to see other doctors including for cancer screening, eyes, dental.  He states there is no reason to go as he has no issues.  He is due for his fourth COVID vaccine, his shingles and his tetanus but he refuses today.  Daughter will see if she can get these scheduled at pharmacy.  He says "I am sick and tired of being sick and tired" he saw his neurologist in January who recommended a CT scan of the head due to progressive memory concerns, however he declined to have this done as well.   Past Medical/Surgical History: Past Medical History:  Diagnosis Date  . Aortic valve disease   . Atrial fibrillation ([place])   . BPH (benign prostatic hyperplasia)   . Chronic neck pain   . GERD (gastroesophageal reflux disease)   . History of colonic polyps   . Hyperlipidemia   . Hypertension   . Osteoarthritis   . Persistent atrial fibrillation ([place])   .  Polio   . Prostate cancer [facility])     Past Surgical History:  Procedure Laterality Date  . 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  . CARDIOVERSION  09/2016  . HERNIA REPAIR  1990  . INSERTION PROSTATE RADIATION SEED  2002    Social History:  reports that he has quit smoking. He has a 90.00 pack-year smoking history. He has never used smokeless tobacco. He reports current alcohol use of about 1.0 standard drink of alcohol per week. He reports that he does not use drugs.  Allergies: No Known Allergies  Family History:  Family History  Adopted: Yes  Family history unknown: Yes     Current Outpatient Medications:  .  ascorbic acid (VITAMIN C) 500 MG tablet, Take by mouth., Disp: , Rfl:  .  diltiazem (CARTIA XT) 120 MG 24 hr capsule, Take 1 capsule (120 mg total) by mouth daily., Disp: 90 capsule, Rfl: 1 .  metoprolol tartrate (LOPRESSOR) 50 MG tablet, TAKE ONE TABLET BY MOUTH TWICE A DAY, Disp: 180 tablet, Rfl: 0 .  OMEPRAZOLE PO, Take 20 mg by mouth daily., Disp: , Rfl:  .  tamsulosin (FLOMAX) 0.4 MG CAPS capsule, TAKE ONE CAPSULE BY MOUTH DAILY, Disp: 30 capsule, Rfl: 0 .  warfarin (COUMADIN) 5 MG tablet, TAKE ONE TABLET BY MOUTH DAILY EXCEPT TAKE 1/2 TABLET ON MONDAY AND FRIDAY OR AS DIRECTED, Disp: 90 tablet, Rfl: 1 .  atorvastatin (LIPITOR) 10 MG tablet, Take 1 tablet (10 mg total) by mouth daily., Disp: 90 tablet, Rfl: 1  Review of Systems:  Constitutional: Denies fever, chills, diaphoresis, appetite change. HEENT: Denies photophobia, eye pain, redness,  ear pain, congestion, sore throat, rhinorrhea, sneezing, mouth sores, trouble swallowing, neck pain, neck stiffness and tinnitus.   Respiratory: Denies SOB, DOE, cough, chest tightness,  and wheezing.   Cardiovascular: Denies chest pain, palpitations and leg swelling.  Gastrointestinal: Denies nausea, vomiting, abdominal pain, diarrhea, constipation, blood in stool and abdominal distention.  Genitourinary:  Denies dysuria, urgency, frequency, hematuria, flank pain and difficulty urinating.  Endocrine: Denies: hot or cold intolerance, sweats, changes in hair or nails, polyuria, polydipsia. Musculoskeletal: Denies myalgias, back pain, joint swelling, arthralgias and gait problem.  Skin: Denies pallor, rash and wound.  Neurological: Denies dizziness, seizures, syncope, weakness, light-headedness, numbness and headaches.  Hematological: Denies adenopathy. Easy bruising, personal or family bleeding history  Psychiatric/Behavioral: Denies suicidal ideation, mood changes, confusion, nervousness, sleep disturbance and agitation    Physical Exam: Vitals:   02/09/21 1106  BP: 110/80  Pulse: 90  Temp: 98 F (36.7 C)  TempSrc: Oral  SpO2: 97%  Weight: 176 lb 1.6 oz (79.9 kg)  Height: 5' 6.5" (1.689 m)    Body mass index is 28 kg/m.    Constitutional: NAD, calm, comfortable Eyes: PERRL, lids and conjunctivae normal ENMT: Mucous membranes are moist. Posterior pharynx clear of any exudate or lesions. Normal dentition. Tympanic membrane is pearly white, no erythema or bulging. Neck: normal, supple, no masses, no thyromegaly Respiratory: clear to auscultation bilaterally, no wheezing, no crackles. Normal respiratory effort. No accessory muscle use.  Cardiovascular: Regular rate and rhythm, no murmurs / rubs / gallops. No extremity edema. 2+ pedal pulses. No carotid bruits.  Abdomen: no tenderness, no masses palpated. No hepatosplenomegaly. Bowel sounds positive.  Musculoskeletal: no clubbing / cyanosis. No joint deformity upper and lower extremities. Good ROM, no contractures. Normal muscle tone.  Skin: no rashes, lesions, ulcers. No induration Neurologic: CN 2-12 grossly intact. Sensation intact, DTR normal. Strength 5/5 in all 4.  Psychiatric: Normal judgment and insight. Alert and oriented x 3. Normal mood.    Subsequent Medicare wellness visit   1. Risk factors, based on past  M,S,F  -cardiovascular disease risk factors include age, gender, history of coronary artery disease, history of hyperlipidemia, history of hypertension   2.  Physical activities: Sedentary   3.  Depression/mood:  He is depressed   4.  Hearing:  Significant issues, declines audiology referral   5.  ADL's: Independent in most ADLs   6.  Fall risk:  High fall risk due to history of falls and recurrent syncope   7.  Home safety: No problems identified   8.  Height weight, and visual acuity: height and weight as above, vision:   Visual Acuity Screening   Right eye Left eye Both eyes  Without correction:     With correction: 20/80 20/25 20/25      9.  Counseling:  I have recommended that he make all of his age-appropriate preventive appointments, he declines.daughter will see about getting vaccinations updated at pharmacy.   10. Lab orders based on risk factors: Laboratory update will be reviewed   11. Referral :  None today   12. Care plan:  Follow-up with me in 6 months   13. Cognitive assessment:  Significant cognitive impairment/memory loss   14. Screening: Patient provided with a written and personalized 5-10 year screening schedule in the AVS.   yes   15. Provider List Update:   PCP, cardiology Dr. Claiborne Billings  16.  Advance Directives: Full code   17. Opioids: Patient is not on any opioid prescriptions and has no risk factors for a substance use disorder.   [place] Office Visit from 09/08/2019 in [place] at [place]  PHQ-9 Total Score 1      Fall Risk  02/09/2021 11/16/2020 02/16/2020 09/08/2019 07/23/2019  Falls in the past year? 1 1 0 0 0  Number falls in past yr: 0 1 0 0 0  Injury with Fall? 1 1 0 0 0  Risk for fall due to : - - No Fall Risks - -  Follow up - - - - -      Impression and Plan:  Encounter for preventive health examination  -Advised routine eye and dental care.  He is due for Tdap, shingles and his fourth COVID vaccine, declines all today  despite counseling. -Screening labs today. -Healthy lifestyle discussed in detail. -He has elected to defer all cancer screening due to his age, I agree.   Persistent atrial fibrillation (HCC), on Coumadin S/P aortic valve replacement Congestive heart failure, NYHA class 2, unspecified congestive heart failure type ([place]) -Followed by cardiology  Hyperlipidemia, unspecified hyperlipidemia type -Last LDL was 63 in 2020, he is on atorvastatin 10 mg daily, check lipids today.  Primary hypertension  - Plan: CBC with Differential/Platelet, Comprehensive metabolic panel -Well-controlled, he is on metoprolol and diltiazem.  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    Patient Instructions   -Nice seeing you today!!  -Lab work today; will notify you once results are available.  -Remember: tetanus, shingles and 4th COVID vaccine.  -Schedule follow up in 1 year or sooner as needed.   Preventive Care 70 Years and Older, Male Preventive care refers to lifestyle choices and visits with your health care provider that can promote health and wellness. This includes:  A yearly physical exam. This is also called an annual wellness visit.  Regular dental and eye exams.  Immunizations.  Screening for certain conditions.  Healthy lifestyle choices, such as: ? Eating a healthy diet. ? Getting regular exercise. ? Not using drugs or products that contain nicotine and tobacco. ? Limiting alcohol use. What can I expect for my preventive care visit? Physical exam Your health care provider will check your:  Height and weight. These may be used to calculate your BMI (body mass index). BMI is a measurement that tells if you are at a healthy weight.  Heart rate and  blood pressure.  Body temperature.  Skin for abnormal spots. Counseling Your health care provider may ask you questions about your:  Past medical problems.  Family's medical history.  Alcohol, tobacco, and drug use.  Emotional well-being.  Home life and relationship well-being.  Sexual activity.  Diet, exercise, and sleep habits.  History of falls.  Memory and ability to understand (cognition).  Work and work Statistician.  Access to firearms. What immunizations do I need? Vaccines are usually given at various ages, according to a schedule. Your health care provider will recommend vaccines for you based on your age, medical history, and lifestyle or other factors, such as travel or where you work.   What tests do I need? Blood tests  Lipid and cholesterol levels. These may be checked every 5 years, or more often depending on your overall  health.  Hepatitis C test.  Hepatitis B test. Screening  Lung cancer screening. You may have this screening every year starting at age 86 if you have a 30-pack-year history of smoking and currently smoke or have quit within the past 15 years.  Colorectal cancer screening. ? All adults should have this screening starting at age 32 and continuing until age 52. ? Your health care provider may recommend screening at age 68 if you are at increased risk. ? You will have tests every 1-10 years, depending on your results and the type of screening test.  Prostate cancer screening. Recommendations will vary depending on your family history and other risks.  Genital exam to check for testicular cancer or hernias.  Diabetes screening. ? This is done by checking your blood sugar (glucose) after you have not eaten for a while (fasting). ? You may have this done every 1-3 years.  Abdominal aortic aneurysm (AAA) screening. You may need this if you are a current or former smoker.  STD (sexually transmitted disease) testing, if you are at  risk. Follow these instructions at home: Eating and drinking  Eat a diet that includes fresh fruits and vegetables, whole grains, lean protein, and low-fat dairy products. Limit your intake of foods with high amounts of sugar, saturated fats, and salt.  Take vitamin and mineral supplements as recommended by your health care provider.  Do not drink alcohol if your health care provider tells you not to drink.  If you drink alcohol: ? Limit how much you have to 0-2 drinks a day. ? Be aware of how much alcohol is in your drink. In the [place] one drink equals one 12 oz bottle of beer (355 mL), one 5 oz glass of wine (148 mL), or one 1 oz glass of hard liquor (44 mL).   Lifestyle  Take daily care of your teeth and gums. Brush your teeth every morning and night with fluoride toothpaste. Floss one time each day.  Stay active. Exercise for at least 30 minutes 5 or more days each week.  Do not use any products that contain nicotine or tobacco, such as cigarettes, e-cigarettes, and chewing tobacco. If you need help quitting, ask your health care provider.  Do not use drugs.  If you are sexually active, practice safe sex. Use a condom or other form of protection to prevent STIs (sexually transmitted infections).  Talk with your health care provider about taking a low-dose aspirin or statin.  Find healthy ways to cope with stress, such as: ? Meditation, yoga, or listening to music. ? Journaling. ? Talking to a trusted person. ? Spending time with friends and family. Safety  Always wear your seat belt while driving or riding in a vehicle.  Do not drive: ? If you have been drinking alcohol. Do not ride with someone who has been drinking. ? When you are tired or distracted. ? While texting.  Wear a helmet and other protective equipment during sports activities.  If you have firearms in your house, make sure you follow all gun safety procedures. What's next?  Visit your health care  provider once a year for an annual wellness visit.  Ask your health care provider how often you should have your eyes and teeth checked.  Stay up to date on all vaccines. This information is not intended to replace advice given to you by your health care provider. Make sure you discuss any questions you have with your health care provider. Document Revised: 07/14/2019  Document Reviewed: 10/09/2018 Elsevier Patient Education  2021 [place], MD [facility]

## 2021-02-15 ENCOUNTER — Encounter: Payer: Self-pay | Admitting: Internal Medicine

## 2021-02-15 ENCOUNTER — Other Ambulatory Visit: Payer: Self-pay | Admitting: Internal Medicine

## 2021-02-15 ENCOUNTER — Other Ambulatory Visit: Payer: Self-pay

## 2021-02-15 ENCOUNTER — Ambulatory Visit (INDEPENDENT_AMBULATORY_CARE_PROVIDER_SITE_OTHER): Payer: Medicare Other | Admitting: General Practice

## 2021-02-15 DIAGNOSIS — E538 Deficiency of other specified B group vitamins: Secondary | ICD-10-CM | POA: Insufficient documentation

## 2021-02-15 DIAGNOSIS — Z7901 Long term (current) use of anticoagulants: Secondary | ICD-10-CM | POA: Diagnosis not present

## 2021-02-15 DIAGNOSIS — E559 Vitamin D deficiency, unspecified: Secondary | ICD-10-CM

## 2021-02-15 LAB — POCT INR: INR: 2 (ref 2.0–3.0)

## 2021-02-15 MED ORDER — VITAMIN D (ERGOCALCIFEROL) 1.25 MG (50000 UNIT) PO CAPS: 50000.0000 [IU] | ORAL_CAPSULE | ORAL | 0 refills | Status: AC

## 2021-02-15 NOTE — Patient Instructions (Addendum)
Pre visit review using our clinic review tool, if applicable. No additional management support is needed unless otherwise documented below in the visit note.  Change dosage and take 1 tablet daily except 1/2 tablet on Mondays.  Re-check in 4 weeks.

## 2021-02-15 NOTE — Progress Notes (Signed)
I have reviewed and agree with note, evaluation, plan.   Linzi Ohlinger, MD  

## 2021-02-16 ENCOUNTER — Other Ambulatory Visit: Payer: Self-pay | Admitting: Internal Medicine

## 2021-02-16 DIAGNOSIS — E559 Vitamin D deficiency, unspecified: Secondary | ICD-10-CM

## 2021-02-19 ENCOUNTER — Other Ambulatory Visit: Payer: Self-pay | Admitting: Internal Medicine

## 2021-02-23 ENCOUNTER — Encounter: Payer: Self-pay | Admitting: Internal Medicine

## 2021-03-06 ENCOUNTER — Other Ambulatory Visit: Payer: Self-pay

## 2021-03-06 ENCOUNTER — Ambulatory Visit (INDEPENDENT_AMBULATORY_CARE_PROVIDER_SITE_OTHER): Payer: Medicare Other | Admitting: *Deleted

## 2021-03-06 DIAGNOSIS — E538 Deficiency of other specified B group vitamins: Secondary | ICD-10-CM | POA: Diagnosis not present

## 2021-03-06 MED ORDER — CYANOCOBALAMIN 1000 MCG/ML IJ SOLN
1000.0000 ug | Freq: Once | INTRAMUSCULAR | Status: AC
  Administered 2021-03-06: 1000 ug via INTRAMUSCULAR

## 2021-03-06 NOTE — Progress Notes (Signed)
Per orders of Dr. Burchette, injection of Cyanocobalamin 1000mcg given by Raesean Bartoletti A. Patient tolerated injection well.  

## 2021-03-07 ENCOUNTER — Ambulatory Visit (INDEPENDENT_AMBULATORY_CARE_PROVIDER_SITE_OTHER): Payer: Medicare Other

## 2021-03-07 DIAGNOSIS — E538 Deficiency of other specified B group vitamins: Secondary | ICD-10-CM | POA: Diagnosis not present

## 2021-03-07 MED ORDER — CYANOCOBALAMIN 1000 MCG/ML IJ SOLN
1000.0000 ug | Freq: Once | INTRAMUSCULAR | Status: AC
  Administered 2021-03-07: 1000 ug via INTRAMUSCULAR

## 2021-03-07 NOTE — Progress Notes (Signed)
Per orders from Dr Jerilee Hoh, patient given B12 injection by Madaline Guthrie, patient tolerated well.

## 2021-03-08 ENCOUNTER — Other Ambulatory Visit: Payer: Self-pay

## 2021-03-08 ENCOUNTER — Ambulatory Visit (INDEPENDENT_AMBULATORY_CARE_PROVIDER_SITE_OTHER): Payer: Medicare Other | Admitting: *Deleted

## 2021-03-08 DIAGNOSIS — E538 Deficiency of other specified B group vitamins: Secondary | ICD-10-CM | POA: Diagnosis not present

## 2021-03-08 MED ORDER — CYANOCOBALAMIN 1000 MCG/ML IJ SOLN
1000.0000 ug | Freq: Once | INTRAMUSCULAR | Status: AC
  Administered 2021-03-08: 1000 ug via INTRAMUSCULAR

## 2021-03-08 NOTE — Progress Notes (Signed)
Per orders of Dr. Hernandez, injection of B12 given by Veera Stapleton. Patient tolerated injection well.  

## 2021-03-09 ENCOUNTER — Ambulatory Visit (INDEPENDENT_AMBULATORY_CARE_PROVIDER_SITE_OTHER): Payer: Medicare Other | Admitting: *Deleted

## 2021-03-09 DIAGNOSIS — E538 Deficiency of other specified B group vitamins: Secondary | ICD-10-CM

## 2021-03-09 MED ORDER — CYANOCOBALAMIN 1000 MCG/ML IJ SOLN
1000.0000 ug | Freq: Once | INTRAMUSCULAR | Status: AC
  Administered 2021-03-09: 1000 ug via INTRAMUSCULAR

## 2021-03-09 NOTE — Progress Notes (Signed)
Per orders of Dr. Hernandez, injection of Cyanocobalamin 1000mcg given by Imaan Padgett A. Patient tolerated injection well. 

## 2021-03-10 ENCOUNTER — Other Ambulatory Visit: Payer: Self-pay

## 2021-03-10 ENCOUNTER — Ambulatory Visit (INDEPENDENT_AMBULATORY_CARE_PROVIDER_SITE_OTHER): Payer: Medicare Other | Admitting: *Deleted

## 2021-03-10 DIAGNOSIS — E538 Deficiency of other specified B group vitamins: Secondary | ICD-10-CM

## 2021-03-10 MED ORDER — CYANOCOBALAMIN 1000 MCG/ML IJ SOLN
1000.0000 ug | Freq: Once | INTRAMUSCULAR | Status: AC
  Administered 2021-03-10: 1000 ug via INTRAMUSCULAR

## 2021-03-10 NOTE — Progress Notes (Signed)
Per orders of Dr. Hernandez, injection of b12 given by Elleanna Melling. Patient tolerated injection well.  

## 2021-03-14 ENCOUNTER — Ambulatory Visit (INDEPENDENT_AMBULATORY_CARE_PROVIDER_SITE_OTHER): Payer: Medicare Other | Admitting: *Deleted

## 2021-03-14 ENCOUNTER — Other Ambulatory Visit: Payer: Self-pay

## 2021-03-14 DIAGNOSIS — E538 Deficiency of other specified B group vitamins: Secondary | ICD-10-CM

## 2021-03-14 MED ORDER — CYANOCOBALAMIN 1000 MCG/ML IJ SOLN
1000.0000 ug | Freq: Once | INTRAMUSCULAR | Status: AC
  Administered 2021-03-14: 1000 ug via INTRAMUSCULAR

## 2021-03-14 NOTE — Progress Notes (Signed)
Per orders of Dr. Hernandez, injection of Cyanocobalamin 1000mcg given by Fredrick Geoghegan A. Patient tolerated injection well. 

## 2021-03-15 ENCOUNTER — Ambulatory Visit (INDEPENDENT_AMBULATORY_CARE_PROVIDER_SITE_OTHER): Payer: Medicare Other | Admitting: General Practice

## 2021-03-15 ENCOUNTER — Telehealth: Payer: Self-pay

## 2021-03-15 DIAGNOSIS — Z7901 Long term (current) use of anticoagulants: Secondary | ICD-10-CM | POA: Diagnosis not present

## 2021-03-15 LAB — POCT INR: INR: 8 — AB (ref 2.0–3.0)

## 2021-03-15 NOTE — Patient Instructions (Addendum)
Pre visit review using our clinic review tool, if applicable. No additional management support is needed unless otherwise documented below in the visit note.  Stop coumadin!  Do not take coumadin through 5/24. Pt has an appointment on 5/24 at [facility] for a B-12.  Nana ([employer]ar[employer] manager) will check INR then and report it to Dr. Jerilee Hoh. Follow up appointment with Villa Herb, RN is scheduled for 6/1.   RN notified daughter, [name] (with patient's permission) of instructions.  **Go to ER or call 911 if you fall or if you experience unusual bleeding.  Patient voices understanding.

## 2021-03-15 NOTE — Telephone Encounter (Signed)
Adella Hare, RN spoke with patient's daughter and we discussed patient's coag visit this morning.  I did try to call [name] (patient's daughter this morning) but she didn't answer and her VM was full.  I reiterated to [name] the instructions I gave the patient this morning.  She did say that she could see the visit notes on my chart as well.  [name] verbalized understanding and thanked me for calling. (see anticoagulation note for 5/18).

## 2021-03-15 NOTE — Progress Notes (Signed)
I have reviewed and agree with note, evaluation, plan.   I am also forwarding to Dr.  Jerilee Hoh in case she has a different preferred approach.   Garret Reddish, MD

## 2021-03-15 NOTE — Telephone Encounter (Signed)
Patient's daughter is calling in stating that she was looking at her dad's Deloris Ping and seen he was seen today with [place]. When reviewing the notes it had said to stop coumadin appointment and that she was notified but never received a call from anyone and she has questions.

## 2021-03-21 ENCOUNTER — Ambulatory Visit (INDEPENDENT_AMBULATORY_CARE_PROVIDER_SITE_OTHER): Payer: Medicare Other

## 2021-03-21 ENCOUNTER — Other Ambulatory Visit: Payer: Self-pay

## 2021-03-21 ENCOUNTER — Telehealth: Payer: Self-pay

## 2021-03-21 DIAGNOSIS — Z7901 Long term (current) use of anticoagulants: Secondary | ICD-10-CM | POA: Diagnosis not present

## 2021-03-21 DIAGNOSIS — I4819 Other persistent atrial fibrillation: Secondary | ICD-10-CM

## 2021-03-21 DIAGNOSIS — E538 Deficiency of other specified B group vitamins: Secondary | ICD-10-CM

## 2021-03-21 LAB — POCT INR: INR: 1.1 — AB (ref 2.0–3.0)

## 2021-03-21 MED ORDER — CYANOCOBALAMIN 1000 MCG/ML IJ SOLN
1000.0000 ug | Freq: Once | INTRAMUSCULAR | Status: AC
  Administered 2021-03-21: 1000 ug via INTRAMUSCULAR

## 2021-03-21 MED ORDER — CYANOCOBALAMIN 1000 MCG/ML IJ SOLN: 1000.0000 ug | Freq: Once | INTRAMUSCULAR | Status: DC

## 2021-03-21 NOTE — Telephone Encounter (Signed)
Received [employer] Teams msg from Westley Hummer that Dr. Jerilee Hoh is requesting this coumadin nurse take care of the pts coumadin management while Villa Herb is out of the office today. Unice Cobble, CMA, that this nurse would contact pt or pt's daughter, [name] at [phone], with dosing instructions. Apolonio Schneiders verbalized understanding.  Coumadin encounter started and documentation for dosing is in that encounter  The msg is below. [2:27 PM] Westley Hummer Hi,  Im Marcus at [facility].  cindy asked me to reach out to you about a PT/INR  [2:28 PM] Marcus Galloway, Marcus Tarick Wellons65 year old male Mar 19, 1941  [2:28 PM] Westley Hummer He's PT/INR is 1.1  [2:29 PM] Randall An [facility], is Jenny Reichmann out of the office?  [2:29 PM] Westley Hummer INR is down to 1.0 I would re-start his prior dosage and re-check on 6-1. You can see his dosage in the coagulation notes. per Jenny Reichmann  [2:29 PM] Westley Hummer yes she is on vacation.    [2:30 PM] Westley Hummer she told me to text her, but I dont want to bother her

## 2021-03-21 NOTE — Patient Instructions (Addendum)
Pre visit review using our clinic review tool, if applicable. No additional management support is needed unless otherwise documented below in the visit note.  Contacted pt and advised of dosing. Take 10mg  today and 10 mg tomorrow and then continue 5mg  daily except take 2.5 mg on Mondays. Recheck on 6/1 with Cindy.  Pt said he would take the suggested dose but it was very frustrating that the dosing changes so much. He said he was not sure what to take so he only took 2.5 mg and this morning he only took 2.5mg . He repeated said he knows the problem with him understanding the medication is his problem and not the PCP or Cindy's or this nurses'. He reported he is trying to do the best he can with the medication but it is hard to help him because he doesn't understanding what is going on. He repeated the dosing back to verify the dosing given and repeated it back completely correct. Advised if he was drinking excessively or notice any signs of unusual bleeding or if he has a fall to contact his PCPs office immediately or take go to the ER. Advised pt of his apt on 6/1. Advised verbalized understanding.

## 2021-03-21 NOTE — Progress Notes (Signed)
Per orders of Dr. Jerilee Hoh, injection of 1000 mcg given by Nancyann Cotterman L Tamicka Shimon. Patient tolerated injection well.

## 2021-03-22 ENCOUNTER — Other Ambulatory Visit: Payer: Medicare Other

## 2021-03-22 NOTE — Progress Notes (Signed)
Agree.  Thanks.  Routed to PCP as FYI and for input.

## 2021-03-22 NOTE — Telephone Encounter (Signed)
Unable to reach pt or pt's daughter, Marcus Galloway, last night but left VM. Marcus Galloway returned call this morning. She reports pt was very confused the day he came in for INR check and she kept having to remind him why he was coming in. Concerning the cause of the elevated INR, she reports the pt does consume more alcohol than he tells anyone and at other times he does not drink at all and even at times he does not remember how much he drinks. She reports since his INR was 8.0 she does not think he has consumed any alcohol. She reports he also seems to be under the impression that he will only not need to take coumadin forever and that he is frustrated that he has to continue to take it and there are so many changes to his dosing. She reports in the past he has not taken it or changed the dosing because he thought he didn't need it or thought he was being prescribed too much. She reports pt also has expired medication, she is not sure if he has expired coumadin, but she is going to check, that he may be taking. She reports he keeps all medication even if he is not taking it any longer. She reports " he is just done, he just wants to go to sleep one night and not wake up. Like he is just tired of going to doctors and taking medications and he just wants to go quietly in his sleep." She said he is definitely not suicidal and that is not what she means but he is just tired of all the struggles.  She also reported he told her last Thurs, 5/19, that he has had blood in his stool for a few days and then changed it to maybe 4 days. He blamed this on eating [employer] peanut butter and the recall for salmonella. She advised him to contact his PCP but he would not and she was concerned to report it because it would strain their relationship and that would not help him in his future care. Advised this is very serious and should be reported immediately to the PCP. She said she would report it in the future.   She asked for the dosing  instructions for the pt but also that this nurse try to contact the pt also so he may listen and follow the instructions more if the call came from the nurse. Gave dosing instructions to increase dose today and tomorrow to 10 mg then continue taking 5mg  daily except take 2.5mg  on Mondays. Recheck on 6/1 apt with Cindy. Advised if she noticed pt drinking excessively or notice any signs of unusual bleeding or if he has a fall to contact his PCPs office immediately or take him to the ER. Marcus Galloway verbalized understanding.   Contacted pt and advised of dosing above. Pt said he would take the suggested dose but it was very frustrating that the dosing changes so much. He said he was not sure what to take so he only took 2.5 mg and this morning he only took 2.5mg . He repeated said he knows the problem with him understanding the medication is his problem and not the PCP or Cindy's or this nurses'. He reported he is trying to do the best he can with the medication but it is hard to help him because he doesn't understanding what is going on. He repeated the dosing back to verify the dosing given and repeated it back completely correct. Advised  if he was drinking excessively or notice any signs of unusual bleeding or if he has a fall to contact his PCPs office immediately or take go to the ER. Advised pt of his apt on 6/1. Advised verbalized understanding.

## 2021-03-28 ENCOUNTER — Ambulatory Visit: Payer: Medicare Other

## 2021-03-29 ENCOUNTER — Ambulatory Visit (INDEPENDENT_AMBULATORY_CARE_PROVIDER_SITE_OTHER): Payer: Medicare Other | Admitting: General Practice

## 2021-03-29 ENCOUNTER — Other Ambulatory Visit: Payer: Self-pay

## 2021-03-29 DIAGNOSIS — Z7901 Long term (current) use of anticoagulants: Secondary | ICD-10-CM | POA: Diagnosis not present

## 2021-03-29 LAB — POCT INR: INR: 1.7 — AB (ref 2.0–3.0)

## 2021-03-29 NOTE — Progress Notes (Signed)
I have reviewed and agree with note, evaluation, plan.   Rachelanne Whidby, MD  

## 2021-03-29 NOTE — Patient Instructions (Signed)
Pre visit review using our clinic review tool, if applicable. No additional management support is needed unless otherwise documented below in the visit note.  Take 1 1/2 tablets today (6/1) and then continue 5mg  daily except take 2.5mg  on Mondays. Recheck in 3 weeks.

## 2021-04-04 ENCOUNTER — Ambulatory Visit: Payer: Medicare Other

## 2021-04-05 ENCOUNTER — Other Ambulatory Visit: Payer: Self-pay | Admitting: Internal Medicine

## 2021-04-05 DIAGNOSIS — Z7901 Long term (current) use of anticoagulants: Secondary | ICD-10-CM

## 2021-04-09 ENCOUNTER — Other Ambulatory Visit: Payer: Self-pay | Admitting: Internal Medicine

## 2021-04-11 ENCOUNTER — Other Ambulatory Visit: Payer: Self-pay

## 2021-04-12 ENCOUNTER — Encounter: Payer: Self-pay | Admitting: Internal Medicine

## 2021-04-12 ENCOUNTER — Ambulatory Visit (INDEPENDENT_AMBULATORY_CARE_PROVIDER_SITE_OTHER): Payer: Medicare Other | Admitting: Internal Medicine

## 2021-04-12 VITALS — BP 120/82 | HR 52 | Temp 97.8°F | Ht 66.5 in | Wt 173.0 lb

## 2021-04-12 DIAGNOSIS — L821 Other seborrheic keratosis: Secondary | ICD-10-CM | POA: Diagnosis not present

## 2021-04-12 NOTE — Progress Notes (Signed)
Established Patient Office Visit     This visit occurred during the SARS-CoV-2 public health emergency.  Safety protocols were in place, including screening questions prior to the visit, additional usage of staff PPE, and extensive cleaning of exam room while observing appropriate contact time as indicated for disinfecting solutions.    CC/Reason for Visit: "Spot on back"  HPI: Marcus Galloway is a 80 y.o. male who is coming in today for the above mentioned reasons.  He is here with his daughter today.  They would like me to look at a spot on his back.  He tells me that it bleeds and then develops a scab but then he scratches it and it starts bleeding all over again.  He has multiple varicose looking moles over his back.  Past Medical/Surgical History: Past Medical History:  Diagnosis Date   Aortic valve disease    Atrial fibrillation (HCC)    BPH (benign prostatic hyperplasia)    Chronic neck pain    GERD (gastroesophageal reflux disease)    History of colonic polyps    Hyperlipidemia    Hypertension    Osteoarthritis    Persistent atrial fibrillation ([employer])    Polio    Prostate cancer [facility])     Past Surgical History:  Procedure Laterality Date   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   CARDIOVERSION  09/2016   HERNIA REPAIR  1990   INSERTION PROSTATE RADIATION SEED  2002    Social History:  reports that he has quit smoking. His smoking use included cigarettes. He has a 90.00 pack-year smoking history. He has never used smokeless tobacco. He reports current alcohol use of about 1.0 standard drink of alcohol per week. He reports that he does not use drugs.  Allergies: No Known Allergies  Family History:  Family History  Adopted: Yes  Family history unknown: Yes     Current Outpatient Medications:    ascorbic acid (VITAMIN C) 500 MG tablet, Take by mouth., Disp: , Rfl:    atorvastatin (LIPITOR) 10 MG tablet, Take 1 tablet (10 mg total) by  mouth daily., Disp: 90 tablet, Rfl: 1   diltiazem (CARTIA XT) 120 MG 24 hr capsule, Take 1 capsule (120 mg total) by mouth daily., Disp: 90 capsule, Rfl: 1   metoprolol tartrate (LOPRESSOR) 50 MG tablet, TAKE ONE TABLET BY MOUTH TWICE A DAY, Disp: 180 tablet, Rfl: 0   OMEPRAZOLE PO, Take 20 mg by mouth daily., Disp: , Rfl:    tamsulosin (FLOMAX) 0.4 MG CAPS capsule, TAKE ONE CAPSULE BY MOUTH DAILY, Disp: 90 capsule, Rfl: 1   Vitamin D, Ergocalciferol, (DRISDOL) 1.25 MG (50000 UNIT) CAPS capsule, Take 1 capsule (50,000 Units total) by mouth every 7 (seven) days for 12 doses., Disp: 12 capsule, Rfl: 0   warfarin (COUMADIN) 5 MG tablet, TAKE ONE TABLET BY MOUTH DAILY EXCEPT TAKE 1/2 TABLET ON MONDAY AND FRIDAY OR AS DIRECTED, Disp: 90 tablet, Rfl: 1  Review of Systems:  Constitutional: Denies fever, chills, diaphoresis, appetite change and fatigue.  HEENT: Denies photophobia, eye pain, redness, hearing loss, ear pain, congestion, sore throat, rhinorrhea, sneezing, mouth sores, trouble swallowing, neck pain, neck stiffness and tinnitus.   Respiratory: Denies SOB, DOE, cough, chest tightness,  and wheezing.   Cardiovascular: Denies chest pain, palpitations and leg swelling.  Gastrointestinal: Denies nausea, vomiting, abdominal pain, diarrhea, constipation, blood in stool and abdominal distention.  Genitourinary: Denies dysuria, urgency, frequency, hematuria, flank pain and difficulty  urinating.  Endocrine: Denies: hot or cold intolerance, sweats, changes in hair or nails, polyuria, polydipsia. Musculoskeletal: Denies myalgias, back pain, joint swelling, arthralgias and gait problem.  Skin: Denies pallor, rash. Neurological: Denies dizziness, seizures, syncope, weakness, light-headedness, numbness and headaches.  Hematological: Denies adenopathy. Easy bruising, personal or family bleeding history  Psychiatric/Behavioral: Denies suicidal ideation, mood changes, confusion, nervousness, sleep disturbance  and agitation    Physical Exam: Vitals:   04/12/21 0922  BP: 120/82  Pulse: (!) 52  Temp: 97.8 F (36.6 C)  TempSrc: Oral  SpO2: 97%  Weight: 173 lb (78.5 kg)  Height: 5' 6.5" (1.689 m)    Body mass index is 27.5 kg/m.   Constitutional: NAD, calm, comfortable Eyes: PERRL, lids and conjunctivae normal ENMT: Mucous membranes are moist.  Skin: About a 3 x 2 cm area over his left upper back that appears scarred over, he has multiple verrucous papules over his back.  Impression and Plan:  Verrucous papule - Plan: Ambulatory referral to Dermatology He has multiple verrucous papules over his back. I suspect this is one of them that he has scratched off the verrucous top causing bleeding as he is on Coumadin, then it scabs and then he scratches it off again.  I will refer him to dermatology, I do not believe this is malignant but believe evaluation is appropriate.  For now have advised that he bandage it to avoid irritation.       Lelon Frohlich, MD [facility]

## 2021-04-19 ENCOUNTER — Other Ambulatory Visit: Payer: Self-pay

## 2021-04-19 ENCOUNTER — Ambulatory Visit (INDEPENDENT_AMBULATORY_CARE_PROVIDER_SITE_OTHER): Payer: Medicare Other | Admitting: General Practice

## 2021-04-19 DIAGNOSIS — Z7901 Long term (current) use of anticoagulants: Secondary | ICD-10-CM | POA: Diagnosis not present

## 2021-04-19 LAB — POCT INR: INR: 1.7 — AB (ref 2.0–3.0)

## 2021-04-19 NOTE — Progress Notes (Signed)
I have reviewed and agree with note, evaluation, plan.   Sedra Morfin, MD  

## 2021-04-19 NOTE — Patient Instructions (Addendum)
Pre visit review using our clinic review tool, if applicable. No additional management support is needed unless otherwise documented below in the visit note.  Start taking 5 mg (1 tablet) every day.  Re-check in 3 weeks.

## 2021-04-20 ENCOUNTER — Ambulatory Visit (INDEPENDENT_AMBULATORY_CARE_PROVIDER_SITE_OTHER): Payer: Medicare Other | Admitting: *Deleted

## 2021-04-20 DIAGNOSIS — E538 Deficiency of other specified B group vitamins: Secondary | ICD-10-CM | POA: Diagnosis not present

## 2021-04-20 MED ORDER — CYANOCOBALAMIN 1000 MCG/ML IJ SOLN
1000.0000 ug | Freq: Once | INTRAMUSCULAR | Status: AC
  Administered 2021-04-20: 1000 ug via INTRAMUSCULAR

## 2021-04-20 NOTE — Progress Notes (Addendum)
Per orders of Dr. Hernandez, injection of B12 given by Jullianna Gabor. Patient tolerated injection well.  

## 2021-04-26 DIAGNOSIS — C44519 Basal cell carcinoma of skin of other part of trunk: Secondary | ICD-10-CM | POA: Diagnosis not present

## 2021-04-26 DIAGNOSIS — D485 Neoplasm of uncertain behavior of skin: Secondary | ICD-10-CM | POA: Diagnosis not present

## 2021-05-10 ENCOUNTER — Other Ambulatory Visit: Payer: Self-pay

## 2021-05-10 ENCOUNTER — Ambulatory Visit (INDEPENDENT_AMBULATORY_CARE_PROVIDER_SITE_OTHER): Payer: Medicare Other | Admitting: General Practice

## 2021-05-10 DIAGNOSIS — Z7901 Long term (current) use of anticoagulants: Secondary | ICD-10-CM

## 2021-05-10 LAB — POCT INR: INR: 2.7 (ref 2.0–3.0)

## 2021-05-10 NOTE — Patient Instructions (Addendum)
Pre visit review using our clinic review tool, if applicable. No additional management support is needed unless otherwise documented below in the visit note.  Continue to take 5 mg (1 tablet) every day.  Re-check in 4 weeks.

## 2021-05-10 NOTE — Progress Notes (Signed)
I have reviewed and agree with note, evaluation, plan.   Symon Norwood, MD  

## 2021-05-11 ENCOUNTER — Other Ambulatory Visit: Payer: Self-pay | Admitting: Internal Medicine

## 2021-05-11 DIAGNOSIS — E559 Vitamin D deficiency, unspecified: Secondary | ICD-10-CM

## 2021-05-23 ENCOUNTER — Ambulatory Visit (INDEPENDENT_AMBULATORY_CARE_PROVIDER_SITE_OTHER): Payer: Medicare Other | Admitting: *Deleted

## 2021-05-23 ENCOUNTER — Other Ambulatory Visit: Payer: Self-pay

## 2021-05-23 DIAGNOSIS — E538 Deficiency of other specified B group vitamins: Secondary | ICD-10-CM

## 2021-05-23 MED ORDER — CYANOCOBALAMIN 1000 MCG/ML IJ SOLN
1000.0000 ug | Freq: Once | INTRAMUSCULAR | Status: AC
  Administered 2021-05-23: 1000 ug via INTRAMUSCULAR

## 2021-05-23 NOTE — Progress Notes (Signed)
Per orders of Dr. Hernandez, injection of B12 given by Tomasita Beevers. Patient tolerated injection well.  

## 2021-06-07 ENCOUNTER — Ambulatory Visit (INDEPENDENT_AMBULATORY_CARE_PROVIDER_SITE_OTHER): Payer: Medicare Other

## 2021-06-07 ENCOUNTER — Other Ambulatory Visit: Payer: Self-pay

## 2021-06-07 DIAGNOSIS — Z7901 Long term (current) use of anticoagulants: Secondary | ICD-10-CM | POA: Diagnosis not present

## 2021-06-07 LAB — POCT INR: INR: 2 (ref 2.0–3.0)

## 2021-06-07 NOTE — Progress Notes (Signed)
I have reviewed and agree with note, evaluation, plan.   Kanani Mowbray, MD  

## 2021-06-07 NOTE — Patient Instructions (Addendum)
Pre visit review using our clinic review tool, if applicable. No additional management support is needed unless otherwise documented below in the visit note.  Continue to take 5 mg (1 tablet) every day.  Re-check in 4 weeks.

## 2021-06-12 ENCOUNTER — Other Ambulatory Visit: Payer: Self-pay | Admitting: Cardiovascular Disease

## 2021-06-20 ENCOUNTER — Other Ambulatory Visit: Payer: Self-pay

## 2021-06-20 ENCOUNTER — Ambulatory Visit (INDEPENDENT_AMBULATORY_CARE_PROVIDER_SITE_OTHER): Payer: Medicare Other | Admitting: *Deleted

## 2021-06-20 DIAGNOSIS — E538 Deficiency of other specified B group vitamins: Secondary | ICD-10-CM | POA: Diagnosis not present

## 2021-06-20 MED ORDER — CYANOCOBALAMIN 1000 MCG/ML IJ SOLN
1000.0000 ug | Freq: Once | INTRAMUSCULAR | Status: AC
Start: 2021-06-20 — End: 2021-06-20
  Administered 2021-06-20: 1000 ug via INTRAMUSCULAR

## 2021-06-20 NOTE — Progress Notes (Signed)
Per orders of Dr. Hernandez, injection of Cyanocobalamin 1000mcg given by Yamira Papa A. Patient tolerated injection well. 

## 2021-06-27 DIAGNOSIS — L821 Other seborrheic keratosis: Secondary | ICD-10-CM | POA: Diagnosis not present

## 2021-06-27 DIAGNOSIS — D485 Neoplasm of uncertain behavior of skin: Secondary | ICD-10-CM | POA: Diagnosis not present

## 2021-06-27 DIAGNOSIS — C44519 Basal cell carcinoma of skin of other part of trunk: Secondary | ICD-10-CM | POA: Diagnosis not present

## 2021-07-05 ENCOUNTER — Ambulatory Visit (INDEPENDENT_AMBULATORY_CARE_PROVIDER_SITE_OTHER): Payer: Medicare Other

## 2021-07-05 DIAGNOSIS — Z7901 Long term (current) use of anticoagulants: Secondary | ICD-10-CM | POA: Diagnosis not present

## 2021-07-05 LAB — POCT INR: INR: 2.9 (ref 2.0–3.0)

## 2021-07-05 NOTE — Patient Instructions (Addendum)
Pre visit review using our clinic review tool, if applicable. No additional management support is needed unless otherwise documented below in the visit note.  Continue to take 5 mg (1 tablet) every day. Re-check in 5 weeks due to no coumadin nurse available in 4 wks.

## 2021-07-05 NOTE — Progress Notes (Signed)
I have reviewed and agree with note, evaluation, plan.   Zylpha Poynor, MD  

## 2021-07-07 ENCOUNTER — Other Ambulatory Visit: Payer: Self-pay | Admitting: Internal Medicine

## 2021-07-10 ENCOUNTER — Telehealth: Payer: Self-pay | Admitting: Neurology

## 2021-07-10 NOTE — Telephone Encounter (Signed)
Called and spoke to patients daughter and informed her that Dr. Tomi Likens recommends he be seen at his PCP for possibility of UTI because of his confusion and hallucinations. Informed patients daughter that if that is negative and since these are new issues a follow up appointment is preferred. Patients daughter stated that she will reach out to patients PCP and let us know what the results.   Before hanging up patients daughter wanted to let Dr. Tomi Likens know that a few months ago her dad woke up and didn't remember anything. He couldn't remember where he was and didn't remember that he had 4 children. Patient did start to remember as the day went on. Patients daughter said it was the weirdest thing and came out of no where. Informed patients daughter I would relay this message to Dr. Tomi Likens.

## 2021-07-10 NOTE — Telephone Encounter (Signed)
Daughter called in regarding her dads health. Stated he seems to be going down faster. He said he thinks he is having brain bleeds again ( she said she doesn't understand that ) says his confusion has gotten worse, says he has shooting pain in the back of his head. He is seeing things that arent there. She would like to know if jaffe would like to see him sooner, of not, any advise would be appreciated

## 2021-07-21 ENCOUNTER — Encounter: Payer: Self-pay | Admitting: Neurology

## 2021-07-21 ENCOUNTER — Other Ambulatory Visit: Payer: Medicare Other

## 2021-07-21 ENCOUNTER — Ambulatory Visit: Payer: Medicare Other | Admitting: Neurology

## 2021-07-21 ENCOUNTER — Other Ambulatory Visit: Payer: Self-pay

## 2021-07-21 VITALS — BP 116/74 | HR 68 | Ht 70.0 in | Wt 170.0 lb

## 2021-07-21 DIAGNOSIS — F1021 Alcohol dependence, in remission: Secondary | ICD-10-CM | POA: Diagnosis not present

## 2021-07-21 DIAGNOSIS — F0390 Unspecified dementia without behavioral disturbance: Secondary | ICD-10-CM

## 2021-07-21 DIAGNOSIS — I680 Cerebral amyloid angiopathy: Secondary | ICD-10-CM

## 2021-07-21 DIAGNOSIS — R443 Hallucinations, unspecified: Secondary | ICD-10-CM | POA: Diagnosis not present

## 2021-07-21 MED ORDER — DONEPEZIL HCL 5 MG PO TABS
5.0000 mg | ORAL_TABLET | Freq: Every day | ORAL | 0 refills | Status: DC
Start: 2021-07-21 — End: 2022-03-22

## 2021-07-21 NOTE — Patient Instructions (Signed)
We will start donepezil (Aricept) 5mg  daily for four weeks.  If you are tolerating the medication, then after four weeks, we will increase the dose to 10mg  daily.  Side effects include nausea, vomiting, diarrhea, vivid dreams, and muscle cramps.  Please call the clinic if you experience any of these symptoms. Will check UA and urine culture Check CT head I would like for your daughter to monitor your bills and medications Follow up 6 months.
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
## 2021-07-22 LAB — UA/M W/RFLX CULTURE, ROUTINE

## 2021-07-22 LAB — SPECIMEN STATUS REPORT

## 2021-07-24 ENCOUNTER — Telehealth: Payer: Self-pay | Admitting: Neurology

## 2021-07-24 DIAGNOSIS — F0390 Unspecified dementia without behavioral disturbance: Secondary | ICD-10-CM

## 2021-07-24 NOTE — Telephone Encounter (Signed)
Patient's daughter called and said she looked for the lab results from Friday's urine test the patient completed at the [facility] but they show as cancelled.  She is needing guidance on what to do next.

## 2021-07-24 NOTE — Telephone Encounter (Signed)
Advised pt daughter [name] we will reorder the U/A.  Pt other daughter, will take pt to have B!@ injection at [facility] [place]. Lab added please advise Primary that the order is in the chart already.

## 2021-07-25 ENCOUNTER — Other Ambulatory Visit: Payer: Self-pay

## 2021-07-25 ENCOUNTER — Ambulatory Visit (INDEPENDENT_AMBULATORY_CARE_PROVIDER_SITE_OTHER): Payer: Medicare Other

## 2021-07-25 ENCOUNTER — Ambulatory Visit: Payer: Medicare Other | Admitting: Internal Medicine

## 2021-07-25 ENCOUNTER — Other Ambulatory Visit: Payer: Medicare Other

## 2021-07-25 DIAGNOSIS — F0390 Unspecified dementia without behavioral disturbance: Secondary | ICD-10-CM

## 2021-07-25 DIAGNOSIS — E538 Deficiency of other specified B group vitamins: Secondary | ICD-10-CM | POA: Diagnosis not present

## 2021-07-25 MED ORDER — CYANOCOBALAMIN 1000 MCG/ML IJ SOLN
1000.0000 ug | Freq: Once | INTRAMUSCULAR | Status: AC
  Administered 2021-07-25: 1000 ug via INTRAMUSCULAR

## 2021-07-25 NOTE — Progress Notes (Signed)
New order added. Pt daughter states the PCP office is having trouble with the U/A order.

## 2021-07-26 LAB — UA/M W/RFLX CULTURE, ROUTINE
Bilirubin, UA: NEGATIVE
Glucose, UA: NEGATIVE
Ketones, UA: NEGATIVE
Leukocytes,UA: NEGATIVE
Nitrite, UA: NEGATIVE
Protein,UA: NEGATIVE
RBC, UA: NEGATIVE
Specific Gravity, UA: 1.02 (ref 1.005–1.030)
Urobilinogen, Ur: 0.2 mg/dL (ref 0.2–1.0)
pH, UA: 5 (ref 5.0–7.5)

## 2021-07-26 LAB — MICROSCOPIC EXAMINATION
Bacteria, UA: NONE SEEN
Casts: NONE SEEN /lpf
RBC, Urine: NONE SEEN /hpf (ref 0–2)
WBC, UA: NONE SEEN /hpf (ref 0–5)

## 2021-07-26 LAB — SPECIMEN STATUS REPORT

## 2021-07-26 NOTE — Progress Notes (Signed)
Needed for cosign.

## 2021-08-09 ENCOUNTER — Other Ambulatory Visit: Payer: Medicare Other

## 2021-08-09 ENCOUNTER — Ambulatory Visit (INDEPENDENT_AMBULATORY_CARE_PROVIDER_SITE_OTHER): Payer: Medicare Other

## 2021-08-09 ENCOUNTER — Other Ambulatory Visit: Payer: Self-pay

## 2021-08-09 DIAGNOSIS — Z7901 Long term (current) use of anticoagulants: Secondary | ICD-10-CM

## 2021-08-09 LAB — POCT INR: INR: 3.4 — AB (ref 2.0–3.0)

## 2021-08-09 NOTE — Patient Instructions (Addendum)
Pre visit review using our clinic review tool, if applicable. No additional management support is needed unless otherwise documented below in the visit note.  Hold dose today and then change weekly dose to take 1 tablet daily except take 1/2 tablet on Wednesdays. Re-check in 3 wks.

## 2021-08-10 ENCOUNTER — Ambulatory Visit
Admission: RE | Admit: 2021-08-10 | Discharge: 2021-08-10 | Disposition: A | Discharge status: Home / Self Care | Payer: Medicare Other | Source: Ambulatory Visit | Attending: Neurology | Admitting: Neurology

## 2021-08-10 DIAGNOSIS — R443 Hallucinations, unspecified: Secondary | ICD-10-CM

## 2021-08-10 DIAGNOSIS — I680 Cerebral amyloid angiopathy: Secondary | ICD-10-CM

## 2021-08-10 DIAGNOSIS — F0390 Unspecified dementia without behavioral disturbance: Secondary | ICD-10-CM

## 2021-08-15 ENCOUNTER — Other Ambulatory Visit: Payer: Self-pay | Admitting: Internal Medicine

## 2021-08-15 DIAGNOSIS — E781 Pure hyperglyceridemia: Secondary | ICD-10-CM

## 2021-08-16 ENCOUNTER — Ambulatory Visit: Payer: Medicare Other | Admitting: Neurology

## 2021-08-16 ENCOUNTER — Other Ambulatory Visit: Payer: Self-pay | Admitting: Neurology

## 2021-08-17 ENCOUNTER — Ambulatory Visit: Payer: Medicare Other | Admitting: Neurology

## 2021-08-17 NOTE — Progress Notes (Signed)
Pt daughter advised of Pt CT.

## 2021-08-22 ENCOUNTER — Ambulatory Visit (INDEPENDENT_AMBULATORY_CARE_PROVIDER_SITE_OTHER): Payer: Medicare Other

## 2021-08-22 ENCOUNTER — Other Ambulatory Visit: Payer: Self-pay

## 2021-08-22 DIAGNOSIS — E538 Deficiency of other specified B group vitamins: Secondary | ICD-10-CM | POA: Diagnosis not present

## 2021-08-22 MED ORDER — CYANOCOBALAMIN 1000 MCG/ML IJ SOLN
1000.0000 ug | Freq: Once | INTRAMUSCULAR | Status: AC
  Administered 2021-08-22: 1000 ug via INTRAMUSCULAR

## 2021-08-22 NOTE — Progress Notes (Signed)
Per orders from Dr Jerilee Hoh, pt was given B12 injection by Madaline Guthrie, pt tolerated well.

## 2021-08-24 ENCOUNTER — Other Ambulatory Visit: Payer: Self-pay | Admitting: Internal Medicine

## 2021-08-30 ENCOUNTER — Other Ambulatory Visit: Payer: Self-pay

## 2021-08-30 ENCOUNTER — Ambulatory Visit (INDEPENDENT_AMBULATORY_CARE_PROVIDER_SITE_OTHER): Payer: Medicare Other

## 2021-08-30 DIAGNOSIS — Z7901 Long term (current) use of anticoagulants: Secondary | ICD-10-CM | POA: Diagnosis not present

## 2021-08-30 LAB — POCT INR: INR: 2.2 (ref 2.0–3.0)

## 2021-08-30 NOTE — Progress Notes (Signed)
Continue 1 tablet daily except take 1/2 tablet on Wednesdays.  Re-check in 4 wks.

## 2021-08-30 NOTE — Patient Instructions (Addendum)
Pre visit review using our clinic review tool, if applicable. No additional management support is needed unless otherwise documented below in the visit note.  Continue 1 tablet daily except take 1/2 tablet on Wednesdays. Re-check in 5 wks.

## 2021-08-30 NOTE — Progress Notes (Signed)
I have reviewed and agree with note, evaluation, plan.   Fawne Hughley, MD  

## 2021-09-19 ENCOUNTER — Ambulatory Visit (INDEPENDENT_AMBULATORY_CARE_PROVIDER_SITE_OTHER): Payer: Medicare Other

## 2021-09-19 DIAGNOSIS — E538 Deficiency of other specified B group vitamins: Secondary | ICD-10-CM

## 2021-09-19 MED ORDER — CYANOCOBALAMIN 1000 MCG/ML IJ SOLN
1000.0000 ug | Freq: Once | INTRAMUSCULAR | Status: AC
  Administered 2021-09-19: 1000 ug via INTRAMUSCULAR

## 2021-09-19 NOTE — Progress Notes (Signed)
Per orders of Dr. Hernandez , injection of Cyanocobalamin 1,000 mcg/mL given by Jette Lewan N Karyme Mcconathy. °Patient tolerated injection well. ° °

## 2021-10-04 ENCOUNTER — Other Ambulatory Visit: Payer: Self-pay

## 2021-10-04 ENCOUNTER — Telehealth: Payer: Self-pay

## 2021-10-04 ENCOUNTER — Ambulatory Visit (INDEPENDENT_AMBULATORY_CARE_PROVIDER_SITE_OTHER): Payer: Medicare Other

## 2021-10-04 DIAGNOSIS — Z7901 Long term (current) use of anticoagulants: Secondary | ICD-10-CM

## 2021-10-04 LAB — POCT INR: INR: 1.9 — AB (ref 2.0–3.0)

## 2021-10-04 NOTE — Telephone Encounter (Signed)
Pt was in coumadin clinic today and had a bad experience at the front desk. I discussed this with Tammy, front office supervisor, and she will contact pt about his experience. He was advised at the apt today that his PCP is leaving [facility] and he will need to see a different PCP. Advised pt this nurse recommends Dr. Yong Channel at the [place] [facility] since he lives in the apartments behind the clinic. Advised pt he may not be taking TOC pts but this nurse will send a msg to Dr. Yong Channel to request he take you as a pt. Advised this nurse will f/u with him to let him know what Dr. Yong Channel has decided about taking him as a pt. Pt very appreciative.

## 2021-10-04 NOTE — Patient Instructions (Addendum)
Pre visit review using our clinic review tool, if applicable. No additional management support is needed unless otherwise documented below in the visit note.  Increase dose today to take 1 tablet and then continue 1 tablet daily except take 1/2 tablet on Wednesdays. Re-check in 6 wks.

## 2021-10-04 NOTE — Progress Notes (Signed)
Increase dose today to take 1 tablet and then continue 1 tablet daily except take 1/2 tablet on Wednesdays. Re-check in 6 wks.

## 2021-10-04 NOTE — Progress Notes (Signed)
I have reviewed and agree with note, evaluation, plan.   Ramelo Oetken, MD  

## 2021-10-05 ENCOUNTER — Other Ambulatory Visit: Payer: Self-pay | Admitting: Internal Medicine

## 2021-10-05 DIAGNOSIS — Z7901 Long term (current) use of anticoagulants: Secondary | ICD-10-CM

## 2021-10-05 NOTE — Telephone Encounter (Signed)
Advised pt of Dr. Yong Channel taking him as a pt. Pt was very appreciative. Advised pt a msg would be sent to the front office supervisor that you will need to be scheduled with Dr. Yong Channel. Pt reported he is not in need of anything immediately and can wait for an apt if necessary. Advised if anything else is needed to contact this nurse. Pt appreciative and verbalized understanding.   Pt is due for his annual physical but currently not having any acute problems and can wait for scheduling of apt. And if first apt cannot provide the physical he will be ok with making two different apts.

## 2021-10-05 NOTE — Telephone Encounter (Signed)
Pt is compliant with warfarin management. Sent in refill.

## 2021-10-05 NOTE — Telephone Encounter (Signed)
Yes thanks but may take several months to get in

## 2021-10-10 NOTE — Telephone Encounter (Signed)
error 

## 2021-10-17 ENCOUNTER — Ambulatory Visit (INDEPENDENT_AMBULATORY_CARE_PROVIDER_SITE_OTHER): Payer: Medicare Other | Admitting: *Deleted

## 2021-10-17 DIAGNOSIS — E538 Deficiency of other specified B group vitamins: Secondary | ICD-10-CM | POA: Diagnosis not present

## 2021-10-17 MED ORDER — CYANOCOBALAMIN 1000 MCG/ML IJ SOLN
1000.0000 ug | Freq: Once | INTRAMUSCULAR | Status: AC
  Administered 2021-10-17: 14:00:00 1000 ug via INTRAMUSCULAR

## 2021-10-18 NOTE — Progress Notes (Signed)
Pt here for monthly B12 injection per    B12 1000mcg given IM, and pt tolerated injection well.   Next B12 injection scheduled for 1 month 

## 2021-10-24 ENCOUNTER — Ambulatory Visit: Payer: Medicare Other | Admitting: Neurology

## 2021-11-15 ENCOUNTER — Ambulatory Visit (INDEPENDENT_AMBULATORY_CARE_PROVIDER_SITE_OTHER): Payer: Medicare Other

## 2021-11-15 ENCOUNTER — Other Ambulatory Visit: Payer: Self-pay

## 2021-11-15 DIAGNOSIS — Z7901 Long term (current) use of anticoagulants: Secondary | ICD-10-CM

## 2021-11-15 LAB — POCT INR: INR: 1.9 — AB (ref 2.0–3.0)

## 2021-11-15 NOTE — Progress Notes (Signed)
Increase dose today to take 1 tablet and then change weekly dose to take 1 tablet daily. Re-check in 4 wks.

## 2021-11-15 NOTE — Patient Instructions (Addendum)
Pre visit review using our clinic review tool, if applicable. No additional management support is needed unless otherwise documented below in the visit note.  Increase dose today to take 1 tablet and then change weekly dose to take 1 tablet daily. Re-check in 4 wks.

## 2021-11-21 ENCOUNTER — Ambulatory Visit (INDEPENDENT_AMBULATORY_CARE_PROVIDER_SITE_OTHER): Payer: Medicare Other

## 2021-11-21 DIAGNOSIS — E538 Deficiency of other specified B group vitamins: Secondary | ICD-10-CM

## 2021-11-21 MED ORDER — CYANOCOBALAMIN 1000 MCG/ML IJ SOLN
1000.0000 ug | INTRAMUSCULAR | Status: AC
Start: 2021-11-21 — End: 2022-02-19
  Administered 2021-11-21: 14:00:00 1000 ug via INTRAMUSCULAR

## 2021-11-21 NOTE — Progress Notes (Signed)
Pt here for monthly B12 injection per Dr Jerilee Hoh.  B12 1027mcg given IM left deltoid and pt tolerated injection well.

## 2021-12-07 ENCOUNTER — Other Ambulatory Visit: Payer: Self-pay | Admitting: Cardiovascular Disease

## 2021-12-07 ENCOUNTER — Telehealth: Payer: Self-pay

## 2021-12-07 ENCOUNTER — Telehealth: Payer: Self-pay | Admitting: Internal Medicine

## 2021-12-07 NOTE — Telephone Encounter (Signed)
I left Marcus Galloway a voicemail to contact me at his convenience.

## 2021-12-07 NOTE — Telephone Encounter (Signed)
Daughter returned my call regarding her dad Marcus Galloway. [name] he gets confused with his [name] and sometime says things and makes up things that may not make sense or not intentional.  She is pleased with our office and he will continue coming here.

## 2021-12-07 NOTE — Telephone Encounter (Signed)
Patients daughter called back to speak with Jeannete.[name] provided updated phone number for herself- [phone].

## 2021-12-13 ENCOUNTER — Ambulatory Visit: Payer: Medicare Other

## 2021-12-20 ENCOUNTER — Other Ambulatory Visit: Payer: Self-pay

## 2021-12-20 ENCOUNTER — Ambulatory Visit (INDEPENDENT_AMBULATORY_CARE_PROVIDER_SITE_OTHER): Payer: Medicare Other

## 2021-12-20 DIAGNOSIS — Z7901 Long term (current) use of anticoagulants: Secondary | ICD-10-CM

## 2021-12-20 LAB — POCT INR: INR: 1.3 — AB (ref 2.0–3.0)

## 2021-12-20 NOTE — Progress Notes (Signed)
I have reviewed and agree with note, evaluation, plan.   Bob Eastwood, MD  

## 2021-12-20 NOTE — Patient Instructions (Addendum)
Pre visit review using our clinic review tool, if applicable. No additional management support is needed unless otherwise documented below in the visit note.  Increase dose today to take 1 1/2 tablets and take 1 1/2 tablets tomorrow and then change weekly dose to take 1 tablet daily. Re-check in 1 wks.

## 2021-12-20 NOTE — Progress Notes (Addendum)
Pt reports he has missed several doses of warfarin recently due to his dementia. He sets his night time medication in a cup to take but he falls asleep and forgets to take them. Due to this his warfarin will be boosted today and tomorrow and pt will go back to his prior dosing. Pt will return in 1 week. Increase dose today to take 1 1/2 tablets and take 1 1/2 tablets tomorrow and then change weekly dose to take 1 tablet daily. Re-check in 1 wks.

## 2021-12-26 ENCOUNTER — Ambulatory Visit (INDEPENDENT_AMBULATORY_CARE_PROVIDER_SITE_OTHER): Payer: Medicare Other

## 2021-12-26 DIAGNOSIS — E538 Deficiency of other specified B group vitamins: Secondary | ICD-10-CM | POA: Diagnosis not present

## 2021-12-26 MED ORDER — CYANOCOBALAMIN 1000 MCG/ML IJ SOLN
1000.0000 ug | Freq: Once | INTRAMUSCULAR | Status: AC
  Administered 2021-12-26: 1000 ug via INTRAMUSCULAR

## 2021-12-26 NOTE — Progress Notes (Addendum)
Pt here for monthly B12 injection per Dr Hernandez  B12 1000mcg given IM, and pt tolerated injection well.  Next B12 injection scheduled for next month.  

## 2021-12-27 ENCOUNTER — Other Ambulatory Visit: Payer: Self-pay

## 2021-12-27 ENCOUNTER — Ambulatory Visit (INDEPENDENT_AMBULATORY_CARE_PROVIDER_SITE_OTHER): Payer: Medicare Other

## 2021-12-27 DIAGNOSIS — Z7901 Long term (current) use of anticoagulants: Secondary | ICD-10-CM

## 2021-12-27 LAB — POCT INR: INR: 1.8 — AB (ref 2.0–3.0)

## 2021-12-27 NOTE — Patient Instructions (Addendum)
Pre visit review using our clinic review tool, if applicable. No additional management support is needed unless otherwise documented below in the visit note. ? ?Increase dose today to take 1 1/2 tablets and then change weekly dose to take 1 tablet daily except take 1 1/2 tablets on Mondays. Re-check in 2 wks. ?

## 2021-12-27 NOTE — Progress Notes (Signed)
I have reviewed and agree with note, evaluation, plan.   Rubye Strohmeyer, MD  

## 2021-12-27 NOTE — Progress Notes (Addendum)
Increase dose today to take 1 1/2 tablets and then change weekly dose to take 1 tablet daily except take 1 1/2 tablets on Mondays. Re-check in 2 wks. ?

## 2022-01-02 ENCOUNTER — Other Ambulatory Visit: Payer: Self-pay | Admitting: Internal Medicine

## 2022-01-07 ENCOUNTER — Other Ambulatory Visit: Payer: Self-pay | Admitting: Cardiovascular Disease

## 2022-01-10 ENCOUNTER — Telehealth: Payer: Self-pay

## 2022-01-10 ENCOUNTER — Ambulatory Visit (INDEPENDENT_AMBULATORY_CARE_PROVIDER_SITE_OTHER): Payer: Medicare Other

## 2022-01-10 ENCOUNTER — Other Ambulatory Visit: Payer: Self-pay | Admitting: Internal Medicine

## 2022-01-10 DIAGNOSIS — Z7901 Long term (current) use of anticoagulants: Secondary | ICD-10-CM | POA: Diagnosis not present

## 2022-01-10 DIAGNOSIS — R419 Unspecified symptoms and signs involving cognitive functions and awareness: Secondary | ICD-10-CM

## 2022-01-10 LAB — POCT INR: INR: 6 — AB (ref 2.0–3.0)

## 2022-01-10 LAB — PROTIME-INR
INR: 6.9 ratio (ref 0.8–1.0)
Prothrombin Time: 67.2 s (ref 9.6–13.1)

## 2022-01-10 NOTE — Telephone Encounter (Signed)
Per PCP, pt should make an apt to be assessed. PCP placed a referral for social worker.  ? ?Contacted pt's daughter, Marcus Galloway, and advised pt needs an apt with Dr. Jerilee Hoh to assess the pt. Advised PCP has an opening in her schedule tomorrow. Marcus Galloway reports pt already has a few apts tomorrow and he does not do well if he has to many apts in one day. Advised of next opening on Mon. Marcus Galloway requested a time on Tues, 3/21, because her sister, Marcus Galloway, can bring pt. Scheduled pt for 3/21 at 1:30. Advised pt is to hold warfarin until he is contacted by coumadin clinic on Friday, 3/17. Marcus Galloway verbalized understanding.  ? ? ?

## 2022-01-10 NOTE — Telephone Encounter (Signed)
Pt in coumadin clinic today for INR check and had a 6.0 for INR. Lab draw was completed STAT and results have been phoned to lab.  ?Lab staff brought critical INR value called in of 6.9 ? ?Will f/u with PCP and pt's daughter this afternoon.  ?

## 2022-01-10 NOTE — Progress Notes (Addendum)
Pt reports he has been taking 1 1/2 tablet 3 or 4 times per week. He was supposed to take a booster dose when he was in the office the last time and then change his weekly dose to take 1 tablet daily except take 1 1/2 on Monday. Pt reports he is very confused and tries to keep up with changes but is not sure if he has or not.  ?Sending pt to lab for INR draw stat and will have pt come back on 3/17 for lab draw to recheck INR.  ?Advised pt to hold all doses of warfarin until he hears from this nurse on Friday. Pt denies any signs or symptoms of bleeding but does report two new bruises on his arm. Advised if any signs or symptoms of bleeding to contact office or call 911. Pt verbalized understanding.  ?Advised pt this nurse will f/u with his daughter, [name]. Pt verbalized it is ok to contact [place].  ?Contacted [name] and advised of INR reading today. Advised the risk may outweigh the benefit of continuing warfarin. Pt is confused and not taking medication correctly due to his dementia. She reports that pt will not let any of the family help with even his pill box. She also reports that his alcohol consumption has increased quite a bit and he has drank all of his life but he is drinking more, to the state of almost passing out, or passing out every night. She reports she has talked to him about it but he gets angry. She has also talked to him about his medication but if anything is said he reports he will stop taking all of his meds. Pt is also in depression per [place].  ?Advised it may be better to switch his med to a DOAC but this may be expensive and may not be affordable. Advised this nurse will f/u with PCP, Dr. Deniece Ree this afternoon to inquire if she thinks a DOAC would be more beneficial for the pt. If she sends in a script then [name] can find out what it would cost and see if the pt is willing. She said she will try to pay for some of it but if he found out he would not be happy. She reports she is supposed  to be with him all day tomorrow and she is going to talk to him about changing from warfarin to a DOAC to see if he will be willing to change. Advised this nurse will f/u concerning what PCP thinks about his medication.  ?Advised pt had a lab draw today also for INR and has been scheduled for a lab draw in 23 days, 3/17 to make sure his INR has come down. Advised it is very important that he does not take any warfarin until he hears from the coumadin clinic on 3/17. She said she would f/u with him about holding warfarin. Advised if any concerns or questions to contact the coumadin clinic. Mardene Celeste verbalized understanding and was appreciative of the call.  ? ? ?

## 2022-01-10 NOTE — Patient Instructions (Addendum)
Pre visit review using our clinic review tool, if applicable. No additional management support is needed unless otherwise documented below in the visit note. ? ?Hold all warfarin until you hear from the coumadin nurse on Friday. You have an appointment for a lab draw on Friday, 3/17, at 9:30. If you see any signs or symptoms of bleeding call the coumadin clinic at [phone] or call 911.  ?

## 2022-01-10 NOTE — Progress Notes (Signed)
I have reviewed and agree with note, evaluation, plan.  ? ?I also agree with following up with PCP about possible change in anticoagulation due to dementia and patient's confusion around meds and resistance to help - forwarding at this time ? ?Garret Reddish, MD ?

## 2022-01-11 ENCOUNTER — Telehealth: Payer: Self-pay | Admitting: *Deleted

## 2022-01-11 NOTE — Chronic Care Management (AMB) (Signed)
?  Chronic Care Management  ? ?Outreach Note ? ?01/11/2022 ?Name: Marcus Galloway MRN: [id number] DOB: 1941-06-09 ? ?Marcus Galloway is a 81 y.o. year old male who is a primary care patient of Isaac Bliss, Rayford Halsted, MD. I reached out to [facility] by phone today in response to a referral sent by Mr. Marcus Galloway's primary care provider. ? ?An unsuccessful telephone outreach was attempted today. The patient was referred to the case management team for assistance with care management and care coordination.  ? ?Follow Up Plan: A HIPAA compliant phone message was left for the patient providing contact information and requesting a return call.  ?The care management team will reach out to the patient again over the next 7 days.  ?If patient returns call to provider office, please advise to call [place]* at [phone].* ? ?Laverda Sorenson  ?Care Guide, Embedded Care Coordination ?  Care Management  ?Direct Dial: [phone] ? ?

## 2022-01-11 NOTE — Chronic Care Management (AMB) (Signed)
?  Chronic Care Management  ? ?Note ? ?01/11/2022 ?Name: Marcus Galloway MRN: [id number] DOB: 1941/05/02 ? ?Kouper Spinella is a 81 y.o. year old male who is a primary care patient of Isaac Bliss, Rayford Halsted, MD. I reached out to [facility] by phone today in response to a referral sent by Mr. Marcia Brash PCP. ? ?Mr. Arenz was given information about Chronic Care Management services today including:  ?CCM service includes personalized support from designated clinical staff supervised by his physician, including individualized plan of care and coordination with other care providers ?24/7 contact phone numbers for assistance for urgent and routine care needs. ?Service will only be billed when office clinical staff spend 20 minutes or more in a month to coordinate care. ?Only one practitioner may furnish and bill the service in a calendar month. ?The patient may stop CCM services at any time (effective at the end of the month) by phone call to the office staff. ?The patient is responsible for co-pay (up to 20% after annual deductible is met) if co-pay is required by the individual health plan.  ? ?Daughter DPR on file Vickki Muff  verbally agreed to assistance and services provided by embedded care coordination/care management team today. ? ?Follow up plan: ?Telephone appointment with care management team member scheduled for:01/12/22 ? ?Laverda Sorenson  ?Care Guide, Embedded Care Coordination ?[place]  Care Management  ?Direct Dial: [phone] ? ?

## 2022-01-12 ENCOUNTER — Ambulatory Visit (INDEPENDENT_AMBULATORY_CARE_PROVIDER_SITE_OTHER): Payer: Medicare Other | Admitting: Licensed Clinical Social Worker

## 2022-01-12 ENCOUNTER — Other Ambulatory Visit (INDEPENDENT_AMBULATORY_CARE_PROVIDER_SITE_OTHER): Payer: Medicare Other

## 2022-01-12 ENCOUNTER — Ambulatory Visit (INDEPENDENT_AMBULATORY_CARE_PROVIDER_SITE_OTHER): Payer: Medicare Other

## 2022-01-12 DIAGNOSIS — Z7901 Long term (current) use of anticoagulants: Secondary | ICD-10-CM

## 2022-01-12 DIAGNOSIS — J439 Emphysema, unspecified: Secondary | ICD-10-CM

## 2022-01-12 DIAGNOSIS — I10 Essential (primary) hypertension: Secondary | ICD-10-CM

## 2022-01-12 DIAGNOSIS — I4819 Other persistent atrial fibrillation: Secondary | ICD-10-CM

## 2022-01-12 LAB — PROTIME-INR
INR: 3.1 ratio — ABNORMAL HIGH (ref 0.8–1.0)
Prothrombin Time: 31.7 s — ABNORMAL HIGH (ref 9.6–13.1)

## 2022-01-12 LAB — POCT INR: INR: 3.1 — AB (ref 2.0–3.0)

## 2022-01-12 NOTE — Progress Notes (Signed)
Last lab verification of INR was 6.9 on 3/15. Lab result today is 3.1. Pt has apt with PCP to discuss benefits vs risk and the best path forward with this medication.  ?Hold dose today and then take only 1 tablet daily until appointment with PCP on 3/21 at 1:30.  ?LVM for pt. Contacted pt's daughter, [name], and gave dosing instructions. [name] verbalized understanding and will f/u  to make sure pt has instructions.  ?

## 2022-01-12 NOTE — Patient Instructions (Addendum)
Pre visit review using our clinic review tool, if applicable. No additional management support is needed unless otherwise documented below in the visit note. ? ?Hold dose today and then take only 1 tablet daily until appointment with PCP on 3/21 at 1:30.  ?

## 2022-01-16 ENCOUNTER — Ambulatory Visit (INDEPENDENT_AMBULATORY_CARE_PROVIDER_SITE_OTHER): Payer: Medicare Other | Admitting: Internal Medicine

## 2022-01-16 VITALS — BP 110/70 | HR 59 | Temp 97.6°F | Wt 169.7 lb

## 2022-01-16 DIAGNOSIS — R791 Abnormal coagulation profile: Secondary | ICD-10-CM

## 2022-01-16 DIAGNOSIS — F1020 Alcohol dependence, uncomplicated: Secondary | ICD-10-CM

## 2022-01-16 DIAGNOSIS — I4819 Other persistent atrial fibrillation: Secondary | ICD-10-CM | POA: Diagnosis not present

## 2022-01-16 DIAGNOSIS — F321 Major depressive disorder, single episode, moderate: Secondary | ICD-10-CM | POA: Diagnosis not present

## 2022-01-16 MED ORDER — SERTRALINE HCL 25 MG PO TABS: 25.0000 mg | ORAL_TABLET | Freq: Every day | ORAL | 1 refills | Status: DC

## 2022-01-16 NOTE — Progress Notes (Signed)
? ? ? ?Established Patient Office Visit ? ? ? ? ?This visit occurred during the SARS-CoV-2 public health emergency.  Safety protocols were in place, including screening questions prior to the visit, additional usage of staff PPE, and extensive cleaning of exam room while observing appropriate contact time as indicated for disinfecting solutions.  ? ? ?CC/Reason for Visit: Discuss anticoagulation ? ?HPI: Marcus Galloway is a 81 y.o. male who is coming in today for the above mentioned reasons. 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  He also has a significant history of alcoholism.  Daughter states that he will frequently "pass out from drinking at nighttime".  He feels very unsteady on his feet.  He admits to significant memory deficit.  He states "I hate Coumadin, I hate that the dose changes all the time, I forget how to take it, when I drink too much alcohol I just do not take it at all".  This visit was scheduled by Coumadin clinic RN due to concerns with Coumadin dosing.  His INR was supratherapeutic at 6.9 as he is not taking Coumadin as directed, she was concerned about his alcoholism, falling history and memory issues as it pertains to Coumadin.  2 daughters are present today.  In addition to the above they are concerned about his depression and anxiety and are wondering if he can be medicated for this, he adamantly refuses CBT. ? ? ?Past Medical/Surgical History: ?Past Medical History:  ?Diagnosis Date  ? Aortic valve disease   ? Atrial fibrillation ([place])   ? BPH (benign prostatic  hyperplasia)   ? Chronic neck pain   ? GERD (gastroesophageal reflux disease)   ? History of colonic polyps   ? Hyperlipidemia   ? Hypertension   ? Osteoarthritis   ? Persistent atrial fibrillation ([place])   ? Polio   ? Prostate cancer ([place])   ? ? ?Past Surgical History:  ?Procedure Laterality Date  ? AORTIC VALVE REPLACEMENT  09/2012  ? w/ bioprosthetic valve  ? CARDIOVERSION  09/2016  ? HERNIA REPAIR  1990  ? INSERTION PROSTATE RADIATION SEED  2002  ? ? ?Social History: ? reports that he has quit smoking. His smoking use included cigarettes. He has a 90.00 pack-year smoking history. He has never used smokeless tobacco. He reports current alcohol use of about 1.0 standard drink per week. He reports that he does not use drugs. ? ?Allergies: ?No Known Allergies ? ?Family History:  ?Family History  ?Adopted: Yes  ?Family history unknown: Yes  ? ? ? ?Current Outpatient Medications:  ?  ascorbic acid (VITAMIN C) 500 MG tablet, Take by mouth., Disp: , Rfl:  ?  atorvastatin (LIPITOR) 10 MG tablet, TAKE ONE TABLET BY MOUTH DAILY, Disp: 90 tablet, Rfl: 1 ?  Cyanocobalamin (B-12 COMPLIANCE INJECTION IJ), Inject as directed., Disp: , Rfl:  ?  diltiazem (CARDIZEM CD) 120 MG 24 hr capsule, Take 1 capsule (120 mg total) by mouth daily. Please schedule an appointment with Dr. Claiborne Billings for further refills. 2ND ATTEMPT., Disp: 15 capsule, Rfl: 0 ?  donepezil (ARICEPT) 5 MG tablet, Take 1 tablet (5 mg total) by mouth at bedtime., Disp: 30 tablet, Rfl: 0 ?  metoprolol tartrate (  LOPRESSOR) 50 MG tablet, TAKE ONE TABLET BY MOUTH TWICE A DAY, Disp: 180 tablet, Rfl: 1 ?  OMEPRAZOLE PO, Take 20 mg by mouth daily., Disp: , Rfl:  ?  tamsulosin (FLOMAX) 0.4 MG CAPS capsule, TAKE ONE CAPSULE BY MOUTH DAILY, Disp: 90 capsule, Rfl: 1 ?  sertraline (ZOLOFT) 25 MG tablet, Take 1 tablet (25 mg total) by mouth daily., Disp: 90 tablet, Rfl: 1 ? ?Current Facility-Administered Medications:  ?  cyanocobalamin ((VITAMIN B-12)) injection 1,000 mcg, 1,000 mcg,  Intramuscular, Q30 days, Isaac Bliss, Rayford Halsted, MD, 1,000 mcg at 11/21/21 1410 ? ?Review of Systems:  ?Constitutional: Denies fever, chills, diaphoresis, appetite change and fatigue.  ?HEENT: Denies photophobia, eye pain, redness, hearing loss, ear pain, congestion, sore throat, rhinorrhea, sneezing, mouth sores, trouble swallowing, neck pain, neck stiffness and tinnitus.   ?Respiratory: Denies SOB, DOE, cough, chest tightness,  and wheezing.   ?Cardiovascular: Denies chest pain, palpitations and leg swelling.  ?Gastrointestinal: Denies nausea, vomiting, abdominal pain, diarrhea, constipation, blood in stool and abdominal distention.  ?Genitourinary: Denies dysuria, urgency, frequency, hematuria, flank pain and difficulty urinating.  ?Endocrine: Denies: hot or cold intolerance, sweats, changes in hair or nails, polyuria, polydipsia. ?Musculoskeletal: Denies myalgias, back pain, joint swelling, arthralgias and gait problem.  ?Skin: Denies pallor, rash and wound.  ?Neurological: Denies dizziness, seizures, syncope, weakness, light-headedness, numbness and headaches.  ?Hematological: Denies adenopathy. Easy bruising, personal or family bleeding history  ?Psychiatric/Behavioral: Denies suicidal ideation, mood changes, confusion, nervousness, sleep disturbance and agitation ? ? ? ?Physical Exam: ?Vitals:  ? 01/16/22 1336  ?BP: 110/70  ?Pulse: (!) 59  ?Temp: 97.6 ?F (36.4 ?C)  ?TempSrc: Oral  ?SpO2: 97%  ?Weight: 169 lb 11.2 oz (77 kg)  ? ? ?Body mass index is 24.35 kg/m?. ? ? ?Constitutional: NAD, calm, comfortable ?Eyes: PERRL, lids and conjunctivae normal, wears corrective lenses ?ENMT: Mucous membranes are moist.  ?Respiratory: clear to auscultation bilaterally, no wheezing, no crackles. Normal respiratory effort. No accessory muscle use.  ?Cardiovascular: Regular rate and rhythm, no murmurs / rubs / gallops. No extremity edema.   ?Psychiatric: Normal judgment and insight. Alert and oriented x 3. Normal mood.   ? ? ?Impression and Plan: ? ?Persistent atrial fibrillation ([place]), on Coumadin ? ?Supratherapeutic INR ? ?Alcoholism ([place]) ? ?Depression, major, single episode, moderate ([facility]) - Plan: sertraline (ZOLOFT) 25 MG tablet ? ?-Despite his neurocognitive degenerative disorder, he does appear alert and oriented today. ?-We have spent a significant amount of time discussing risk/benefit of being on Coumadin.  They understand that the benefit is stroke prevention and that the risk is bleeding.  Patient would like to discontinue Coumadin altogether.  I think that he is a high risk for bleeding given his falls, memory loss and unsteady gait and alcoholism.  We discussed possibly transitioning over to a DOAC but we agree that this does not negate his bleeding risk.  Ultimately patient, daughters and I have agreed to discontinue all forms of anticoagulation.  I have urged patient to make an appointment with Dr. Claiborne Billings, his cardiologist, to discuss this as well. ? ?Time spent: 36 minutes reviewing chart, interviewing and examining patient, discussing with daughters present and formulating plan of care. ? ? ?Patient Instructions  ?-Nice seeing you today!! ? ?-STOP COUMADIN. ? ?-Start sertraline 25 mg at bedtime. ? ? ? ?Lelon Frohlich, MD ?[facility] ? ? ?

## 2022-01-16 NOTE — Patient Instructions (Signed)
Visit Information ? ?Thank you for taking time to visit with me today. Please don't hesitate to contact me if I can be of assistance to you before our next scheduled telephone appointment. ? ?Following are the goals we discussed today:  ?Patient Goals/Self-Care Activities: Over the next 120 days ?Attend all scheduled medical appointments ?Utilize healthy coping skills and/or supportive resources discussed ?Contact PCP office with any questions or concerns ? ?Our next appointment is by telephone on 02/23/22 at 10:00 AM ? ?Please call the care guide team at [phone] if you need to cancel or reschedule your appointment.  ? ?If you are experiencing a Mental Health or [place] or need someone to talk to, please call the Suicide and Crisis Lifeline: 988 ?call 911  ? ?Following is a copy of your full plan of care:  ?Care Plan : [place]  ?Updates made by Marcus Chesterfield, LCSW since 01/16/2022 12:00 AM  ?  ? ?Problem: Coping Skills (General Plan of Care)   ?  ? ?Long-Range Goal: Coping Skills Enhanced   ?Start Date: 01/12/2022  ?This Visit's Progress: On track  ?Priority: High  ?Note:   ?Current barriers:   ?Severe Persistent Mental Health needs related to Dementia and Alcohol Dependence ?Mental Health Concerns , Memory Deficits, Inability to perform ADL's independently, and Inability to perform IADL's independently ?Needs Support, Education, and Care Coordination in order to meet unmet mental health needs. ?Clinical Goal(s): verbalize understanding of plan for management of HTN, COPD, and Dementia   ?Clinical Interventions:  ?Assessed patient's previous and current treatment, coping skills, support system and barriers to care  ?Solution-Focused Strategies employed:  ?Active listening / Reflection utilized  ?Emotional Support Provided ?Provided psychoeducation for mental health needs  ?Caregiver stress acknowledged  ?Consideration of in-home help encouraged : options discussed ?Verbalization of  feelings encouraged  ?Discussed caregiver resources and support: LCSW will provide Industrial/product designer  ; ?Review resources, discussed options and provided patient information about  ?[place]  ?Dementia resources and support  ?1:1 collaboration with primary care provider regarding development and update of comprehensive plan of care as evidenced by provider attestation and co-signature ?Inter-disciplinary care team collaboration (Galloway longitudinal plan of care) ?Patient Goals/Self-Care Activities: Over the next 120 days ?Attend all scheduled medical appointments ?Utilize healthy coping skills and/or supportive resources discussed ?Contact PCP office with any questions or concerns ?  ?  ? ? ?Marcus Galloway was given information about Care Management services by the embedded care coordination team including:  ?Care Management services include personalized support from designated clinical staff supervised by his physician, including individualized plan of care and coordination with other care providers ?24/7 contact phone numbers for assistance for urgent and routine care needs. ?The patient may stop CCM services at any time (effective at the end of the month) by phone call to the office staff. ? ?Patient agreed to services and verbal consent obtained.  ? ?Patient verbalizes understanding of instructions and care plan provided today and agrees to view in [place]. Active MyChart status confirmed with patient.   ? ?Marcus Galloway, MSW, LCSW ?[facility] ?[facility] ?[email or web address] ?Phone [phone] ?5:01 PM ? ? ?  ?

## 2022-01-16 NOTE — Patient Instructions (Signed)
-  Nice seeing you today!! ? ?-STOP COUMADIN. ? ?-Start sertraline 25 mg at bedtime. ?

## 2022-01-16 NOTE — Chronic Care Management (AMB) (Signed)
?Chronic Care Management  ? ? Clinical Social Work Note ? ?01/16/2022 ?Name: Marcus Galloway MRN: [id number] DOB: 1940-12-09 ? ?Marcus Galloway is a 81 y.o. year old male who is a primary care patient of Isaac Bliss, Rayford Halsted, MD. The CCM team was consulted to assist the patient with chronic disease management and/or care coordination needs related to: Mental Health Counseling and Resources and Caregiver Stress.  ? ?Engaged with patient's daughter by telephone for initial visit in response to provider referral for social work chronic care management and care coordination services.  ? ?Consent to Services:  ?The patient was given the following information about Chronic Care Management services today, agreed to services, and gave verbal consent: 1. CCM service includes personalized support from designated clinical staff supervised by the primary care provider, including individualized plan of care and coordination with other care providers 2. 24/7 contact phone numbers for assistance for urgent and routine care needs. 3. Service will only be billed when office clinical staff spend 20 minutes or more in a month to coordinate care. 4. Only one practitioner may furnish and bill the service in a calendar month. 5.The patient may stop CCM services at any time (effective at the end of the month) by phone call to the office staff. 6. The patient will be responsible for cost sharing (co-pay) of up to 20% of the service fee (after annual deductible is met). Patient agreed to services and consent obtained. ? ?Patient agreed to services and consent obtained.  ? ?Assessment: Review of patient past medical history, allergies, medications, and health status, including review of relevant consultants reports was performed today as part of a comprehensive evaluation and provision of chronic care management and care coordination services.    ? ?SDOH (Social Determinants of Health) assessments and interventions performed:   ? ?Advanced  Directives Status: Not addressed in this encounter. ? ?CCM Care Plan ? ?No Known Allergies ? ?Outpatient Encounter Medications as of 01/12/2022  ?Medication Sig  ? ascorbic acid (VITAMIN C) 500 MG tablet Take by mouth.  ? atorvastatin (LIPITOR) 10 MG tablet TAKE ONE TABLET BY MOUTH DAILY  ? Cyanocobalamin (B-12 COMPLIANCE INJECTION IJ) Inject as directed.  ? diltiazem (CARDIZEM CD) 120 MG 24 hr capsule Take 1 capsule (120 mg total) by mouth daily. Please schedule an appointment with Dr. Claiborne Billings for further refills. 2ND ATTEMPT.  ? donepezil (ARICEPT) 5 MG tablet Take 1 tablet (5 mg total) by mouth at bedtime.  ? metoprolol tartrate (LOPRESSOR) 50 MG tablet TAKE ONE TABLET BY MOUTH TWICE A DAY  ? OMEPRAZOLE PO Take 20 mg by mouth daily.  ? tamsulosin (FLOMAX) 0.4 MG CAPS capsule TAKE ONE CAPSULE BY MOUTH DAILY  ? [DISCONTINUED] warfarin (COUMADIN) 5 MG tablet TAKE ONE TABLET BY MOUTH DAILY EXCEPT TAKE 1/2 TABLET ON WEDNESDAYS OR AS DIRECTED BY ANTICOAGULATION CLINIC  ? ?Facility-Administered Encounter Medications as of 01/12/2022  ?Medication  ? cyanocobalamin ((VITAMIN B-12)) injection 1,000 mcg  ? ? ?Patient Active Problem List  ? Diagnosis Date Noted  ? Vitamin B12 deficiency 02/15/2021  ? Vitamin D deficiency 09/08/2019  ? Cerebellar ataxia in diseases classified elsewhere [facility]), refuses Neurology PT 12/28/2017  ? Uncomplicated alcohol dependence ([place]), cessation 10/2017 12/28/2017  ? Former smoker, stopped smoking in distant past, quit 1980 12/28/2017  ? 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 Hypertension 12/28/2017  ? Mild pulmonary hypertension ([place]), last ECHO 2018 12/28/2017  ? Long term (current) use of anticoagulants [Z79.01], for persistent atrial fibrillation and  valve replacement 07/19/2017  ? 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 Degenerative arthritis of lumbar spine 05/16/2017  ? BPH with obstruction/lower urinary tract symptoms, on Flomax  01/15/2017  ? Arthralgia of multiple joints, controlled with 1.5 Percocet daily 01/14/2017  ? GERD (gastroesophageal reflux disease), on Omeprazole 01/14/2017  ? Prostate cancer ([place]) 01/14/2017  ? Persistent atrial fibrillation ([place]), on Coumadin 01/14/2017  ? Rosacea, controlled with daily Doxycycline 01/14/2017  ? Degenerative arthritis of cervical spine 01/14/2017  ? Hyperlipidemia, on Lipitor   ? S/P aortic valve replacement 10/17/2012  ? CHF, NYHA class II ([place]), EF 60-65% 10/14/2012  ? ? ?Conditions to be addressed/monitored: HTN and COPD; Level of care concerns, Inability to perform ADL's independently, and Inability to perform IADL's independently ? ?Care Plan : LCSW Plan of Care  ?Updates made by Rebekah Chesterfield, LCSW since 01/16/2022 12:00 AM  ?  ? ?Problem: Coping Skills (General Plan of Care)   ?  ? ?Long-Range Goal: Coping Skills Enhanced   ?Start Date: 01/12/2022  ?This Visit's Progress: On track  ?Priority: High  ?Note:   ?Current barriers:   ?Severe Persistent Mental Health needs related to Dementia and Alcohol Dependence ?Mental Health Concerns , Memory Deficits, Inability to perform ADL's independently, and Inability to perform IADL's independently ?Needs Support, Education, and Care Coordination in order to meet unmet mental health needs. ?Clinical Goal(s): verbalize understanding of plan for management of HTN, COPD, and Dementia   ?Clinical Interventions:  ?Assessed patient's previous and current treatment, coping skills, support system and barriers to care  ?Solution-Focused Strategies employed:  ?Active listening / Reflection utilized  ?Emotional Support Provided ?Provided psychoeducation for mental health needs  ?Caregiver stress acknowledged  ?Consideration of in-home help encouraged : options discussed ?Verbalization of feelings encouraged  ?Discussed caregiver resources and support: LCSW will provide Industrial/product designer  ; ?Review resources, discussed options and provided patient  information about  ?[place]  ?Dementia resources and support  ?1:1 collaboration with primary care provider regarding development and update of comprehensive plan of care as evidenced by provider attestation and co-signature ?Inter-disciplinary care team collaboration (see longitudinal plan of care) ?Patient Goals/Self-Care Activities: Over the next 120 days ?Attend all scheduled medical appointments ?Utilize healthy coping skills and/or supportive resources discussed ?Contact PCP office with any questions or concerns ?  ?  ?  ? ?[name], MSW, LCSW ?[facility] ?[facility] ?[email or web address] ?Phone [phone] ?5:00 PM ? ? ? ?

## 2022-01-17 ENCOUNTER — Ambulatory Visit: Payer: Self-pay

## 2022-01-17 NOTE — Progress Notes (Signed)
Pt had OV with PCP yesterday to discuss warfarin and risk vs benefit for him with several comorbidities, including memory loss. This places pt at high risk for bleeding event if pt is not taking warfarin as directed, which has been the case at the last visit. Pt cannot remember to take warfarin and also cannot remember what dose he is to be taking.  ?Pt, pt's daughters, and PCP discussed placing pt on DOAC but this does not remove the bleeding risk. Unsure if the pt will be able to keep up with this medication either. It was decided at the visit yesterday with PCP that warfarin would be stopped and no further anticoagulants would be prescribed. Pt was in agreement with this decision.  ?Resolving coumadin management episodes.  ?

## 2022-01-22 ENCOUNTER — Other Ambulatory Visit: Payer: Self-pay | Admitting: Cardiovascular Disease

## 2022-01-23 ENCOUNTER — Ambulatory Visit (INDEPENDENT_AMBULATORY_CARE_PROVIDER_SITE_OTHER): Payer: Medicare Other

## 2022-01-23 DIAGNOSIS — E538 Deficiency of other specified B group vitamins: Secondary | ICD-10-CM

## 2022-01-23 MED ORDER — CYANOCOBALAMIN 1000 MCG/ML IJ SOLN
1000.0000 ug | Freq: Once | INTRAMUSCULAR | Status: AC
  Administered 2022-01-23: 1000 ug via INTRAMUSCULAR

## 2022-01-23 NOTE — Progress Notes (Signed)
Pt here for monthly B12 injection per Dr Jerilee Hoh ? ?B12 1044mg given IM on Right Deltoid and pt tolerated injection well. ? ?Next B12 injection scheduled for/ pt will call to schedule ? ? ? ? ?

## 2022-01-26 DIAGNOSIS — I4819 Other persistent atrial fibrillation: Secondary | ICD-10-CM

## 2022-01-26 DIAGNOSIS — J439 Emphysema, unspecified: Secondary | ICD-10-CM

## 2022-01-26 DIAGNOSIS — I10 Essential (primary) hypertension: Secondary | ICD-10-CM
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
## 2022-01-29 ENCOUNTER — Ambulatory Visit: Payer: Medicare Other | Admitting: Neurology

## 2022-01-29 ENCOUNTER — Encounter: Payer: Self-pay | Admitting: Neurology

## 2022-01-29 ENCOUNTER — Telehealth: Payer: Self-pay

## 2022-01-29 VITALS — BP 110/61 | HR 85 | Ht 68.0 in | Wt 170.0 lb

## 2022-01-29 DIAGNOSIS — I680 Cerebral amyloid angiopathy: Secondary | ICD-10-CM | POA: Diagnosis not present

## 2022-01-29 DIAGNOSIS — F0390 Unspecified dementia without behavioral disturbance: Secondary | ICD-10-CM

## 2022-01-29 MED ORDER — MEMANTINE HCL 10 MG PO TABS: ORAL_TABLET | ORAL | 0 refills | Status: DC

## 2022-01-29 NOTE — Patient Instructions (Signed)
Start memantine as directed ?Continue donepezil '10mg'$  at bedtime ?Follow up 9 months. ?

## 2022-01-29 NOTE — Telephone Encounter (Signed)
Telephone call to pt, Per Dr.jaffe please stop by the lab to have labs drawn for B1. ?Lab closed today, office hours given on VM.  ?

## 2022-01-30 ENCOUNTER — Other Ambulatory Visit (INDEPENDENT_AMBULATORY_CARE_PROVIDER_SITE_OTHER): Payer: Medicare Other

## 2022-01-30 DIAGNOSIS — I680 Cerebral amyloid angiopathy: Secondary | ICD-10-CM

## 2022-02-05 LAB — VITAMIN B1: Vitamin B1 (Thiamine): 12 nmol/L (ref 8–30)

## 2022-02-15 ENCOUNTER — Other Ambulatory Visit: Payer: Self-pay | Admitting: Internal Medicine

## 2022-02-15 DIAGNOSIS — E781 Pure hyperglyceridemia: Secondary | ICD-10-CM

## 2022-02-20 ENCOUNTER — Telehealth: Payer: Self-pay

## 2022-02-20 ENCOUNTER — Ambulatory Visit (INDEPENDENT_AMBULATORY_CARE_PROVIDER_SITE_OTHER): Payer: Medicare Other

## 2022-02-20 DIAGNOSIS — E538 Deficiency of other specified B group vitamins: Secondary | ICD-10-CM

## 2022-02-20 MED ORDER — CYANOCOBALAMIN 1000 MCG/ML IJ SOLN
1000.0000 ug | Freq: Once | INTRAMUSCULAR | Status: AC
  Administered 2022-02-20: 1000 ug via INTRAMUSCULAR

## 2022-02-20 NOTE — Progress Notes (Signed)
Pt here for monthly B12 injection per Dr. Jerilee Hoh. ? ?B12 1038mg given IM left deltiod and pt tolerated injection well. ? ?Next B12 injection scheduled for 03/22/22 with CPE. ? ?

## 2022-02-20 NOTE — Telephone Encounter (Signed)
Last B12 lab & order on 02/09/21. ?Pt needs CPE & updated B12 monthly b12 injection order.  ?

## 2022-02-21 ENCOUNTER — Other Ambulatory Visit: Payer: Self-pay | Admitting: Internal Medicine

## 2022-02-23 ENCOUNTER — Ambulatory Visit (INDEPENDENT_AMBULATORY_CARE_PROVIDER_SITE_OTHER): Payer: Medicare Other | Admitting: Licensed Clinical Social Worker

## 2022-02-23 DIAGNOSIS — I10 Essential (primary) hypertension: Secondary | ICD-10-CM

## 2022-02-23 DIAGNOSIS — F1020 Alcohol dependence, uncomplicated: Secondary | ICD-10-CM

## 2022-02-23 DIAGNOSIS — J439 Emphysema, unspecified: Secondary | ICD-10-CM

## 2022-02-24 ENCOUNTER — Other Ambulatory Visit: Payer: Self-pay | Admitting: Neurology

## 2022-02-25 DIAGNOSIS — I10 Essential (primary) hypertension: Secondary | ICD-10-CM | POA: Diagnosis not present

## 2022-02-25 DIAGNOSIS — J439 Emphysema, unspecified: Secondary | ICD-10-CM

## 2022-02-26 NOTE — Patient Instructions (Signed)
Visit Information ? ?Thank you for taking time to visit with me today. Please don't hesitate to contact me if I can be of assistance to you before our next scheduled telephone appointment. ? ?Following are the goals we discussed today:  ?Patient Goals/Self-Care Activities: Over the next 120 days ?Attend all scheduled medical appointments ?Utilize healthy coping skills and/or supportive resources discussed ?Contact PCP office with any questions or concerns ? ?Our next appointment is by telephone on 05/17/22 at 10:45 AM ? ?Please call the care guide team at [phone] if you need to cancel or reschedule your appointment.  ? ?If you are experiencing a Mental Health or [place] or need someone to talk to, please call the Suicide and Crisis Lifeline: 988 ?call 911  ? ?Patient verbalizes understanding of instructions and care plan provided today and agrees to view in [place]. Active MyChart status confirmed with patient.   ? ?[name], MSW, LCSW ?[facility] ?[facility] ?[email or web address] ?Phone [phone] ?9:34 AM ? ?

## 2022-02-26 NOTE — Chronic Care Management (AMB) (Signed)
?Chronic Care Management  ? ? Clinical Social Work Note ? ?02/26/2022 ?Name: Marcus Galloway MRN: [id number] DOB: September 27, 1941 ? ?Chevis Weisensel is a 81 y.o. year old male who is a primary care patient of Isaac Bliss, Rayford Halsted, MD. The CCM team was consulted to assist the patient with chronic disease management and/or care coordination needs related to: Mental Health Counseling and Resources and Caregiver Stress.  ? ?Engaged with patient's daughter, [name], by telephone for follow up visit in response to provider referral for social work chronic care management and care coordination services.  ? ?Consent to Services:  ?The patient was given information about Chronic Care Management services, agreed to services, and gave verbal consent prior to initiation of services.  Please see initial visit note for detailed documentation.  ? ?Patient agreed to services and consent obtained.  ? ?Assessment: Review of patient past medical history, allergies, medications, and health status, including review of relevant consultants reports was performed today as part of a comprehensive evaluation and provision of chronic care management and care coordination services.    ? ?SDOH (Social Determinants of Health) assessments and interventions performed:   ? ?Advanced Directives Status: Not addressed in this encounter. ? ?CCM Care Plan ? ?No Known Allergies ? ?Outpatient Encounter Medications as of 02/23/2022  ?Medication Sig  ? atorvastatin (LIPITOR) 10 MG tablet TAKE ONE TABLET BY MOUTH DAILY  ? Cyanocobalamin (B-12 COMPLIANCE INJECTION IJ) Inject as directed.  ? diltiazem (CARDIZEM CD) 120 MG 24 hr capsule Take 1 capsule (120 mg total) by mouth daily. Keep the appointment with Dr. Claiborne Billings for future refills.  ? donepezil (ARICEPT) 5 MG tablet Take 1 tablet (5 mg total) by mouth at bedtime.  ? memantine (NAMENDA) 10 MG tablet Take 1/2 tablet daily for one week, then 1/2 tablet twice daily for one week, then 1 tablet twice daily  ? metoprolol  tartrate (LOPRESSOR) 50 MG tablet TAKE ONE TABLET BY MOUTH TWICE A DAY  ? OMEPRAZOLE PO Take 20 mg by mouth daily.  ? sertraline (ZOLOFT) 25 MG tablet Take 1 tablet (25 mg total) by mouth daily.  ? tamsulosin (FLOMAX) 0.4 MG CAPS capsule TAKE ONE CAPSULE BY MOUTH DAILY  ? ?No facility-administered encounter medications on file as of 02/23/2022.  ? ? ?Patient Active Problem List  ? Diagnosis Date Noted  ? Vitamin B12 deficiency 02/15/2021  ? Vitamin D deficiency 09/08/2019  ? Cerebellar ataxia in diseases classified elsewhere [facility]), refuses Neurology PT 12/28/2017  ? Uncomplicated alcohol dependence ([place]), cessation 10/2017 12/28/2017  ? Former smoker, stopped smoking in distant past, quit 1980 12/28/2017  ? 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 Hypertension 12/28/2017  ? Mild pulmonary hypertension ([place]), last ECHO 2018 12/28/2017  ? 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 Degenerative arthritis of lumbar spine 05/16/2017  ? BPH with obstruction/lower urinary tract symptoms, on Flomax 01/15/2017  ? Arthralgia of multiple joints, controlled with 1.5 Percocet daily 01/14/2017  ? GERD (gastroesophageal reflux disease), on Omeprazole 01/14/2017  ? Prostate cancer ([place]) 01/14/2017  ? Persistent atrial fibrillation ([place]), on Coumadin 01/14/2017  ? Rosacea, controlled with daily Doxycycline 01/14/2017  ? Degenerative arthritis of cervical spine 01/14/2017  ? Hyperlipidemia, on Lipitor   ? S/P aortic valve replacement 10/17/2012  ? CHF, NYHA class II ([place]), EF 60-65% 10/14/2012  ? ? ?Conditions to be addressed/monitored: HTN, COPD, and Alcohol Dependence ; Level of care concerns and Memory Deficits ? ?Care Plan : LCSW Plan of Care  ?  Updates made by Rebekah Chesterfield, LCSW since 02/26/2022 12:00 AM  ?  ? ?Problem: Coping Skills (General Plan of Care)   ?  ? ?Long-Range Goal: Coping Skills Enhanced   ?Start Date: 01/12/2022  ?Expected End Date: 05/28/2022  ?This Visit's  Progress: On track  ?Recent Progress: On track  ?Priority: High  ?Note:   ?Current barriers:   ?Severe Persistent Mental Health needs related to Dementia and Alcohol Dependence ?Mental Health Concerns , Memory Deficits, Inability to perform ADL's independently, and Inability to perform IADL's independently ?Needs Support, Education, and Care Coordination in order to meet unmet mental health needs. ?Clinical Goal(s): verbalize understanding of plan for management of HTN, COPD, and Dementia   ?Clinical Interventions:  ?Assessed patient's previous and current treatment, coping skills, support system and barriers to care. Pt's daughter, [name], provided all information during encounter ?Patient is doing well with complying with medication management. No reported side effects from Zoloft noting that family has identified pt to have more motivation/focus with decreased irritability. Denies SI/HI ?Patient continues to drink alcohol; however, [name] and siblings are monitoring his intake ?Solution-Focused Strategies employed: LCSW discussed strategies to assist pt with identifying protective factors ?Active listening / Reflection utilized  ?Emotional Support Provided ?Provided psychoeducation for mental health needs  ?Caregiver stress acknowledged  ?Consideration of in-home help encouraged : options discussed ?Verbalization of feelings encouraged  ?Discussed caregiver resources and support: LCSW will provide Caregiver Resource Guide  ?Review resources, discussed options and provided patient information about  ?Dementia resources and support-LCSW discussed local programs and centers that promote socialization and activity for seniors ?[facility] spoke with PACE representative and has declined services due to associated costs ?Inter-disciplinary care team collaboration (see longitudinal plan of care) ?Patient Goals/Self-Care Activities: Over the next 120 days ?Attend all scheduled medical appointments ?Utilize healthy coping  skills and/or supportive resources discussed ?Contact PCP office with any questions or concerns ?  ?  ?  ? ?Follow Up Plan: Appointment scheduled for SW follow up with client by phone on: 05/17/22 ?     ?[name], MSW, LCSW ?[facility] ?[facility] ?[email or web address] ?Phone [phone] ?9:33 AM ? ? ? ?

## 2022-03-21 ENCOUNTER — Other Ambulatory Visit: Payer: Self-pay | Admitting: Internal Medicine

## 2022-03-21 NOTE — Telephone Encounter (Signed)
Received fax also from [facility] requesting refill of warfarin. Contacted pharmacy and spoke with pharmacist and advised pt has been taken off of warfarin. She verbalized understanding.

## 2022-03-22 ENCOUNTER — Ambulatory Visit (INDEPENDENT_AMBULATORY_CARE_PROVIDER_SITE_OTHER): Payer: Medicare Other | Admitting: Internal Medicine

## 2022-03-22 ENCOUNTER — Telehealth: Payer: Self-pay | Admitting: Neurology

## 2022-03-22 ENCOUNTER — Encounter: Payer: Self-pay | Admitting: Internal Medicine

## 2022-03-22 ENCOUNTER — Other Ambulatory Visit: Payer: Self-pay | Admitting: Internal Medicine

## 2022-03-22 VITALS — BP 94/70 | HR 95 | Temp 97.6°F | Ht 65.25 in | Wt 169.5 lb

## 2022-03-22 DIAGNOSIS — I4819 Other persistent atrial fibrillation: Secondary | ICD-10-CM | POA: Diagnosis not present

## 2022-03-22 DIAGNOSIS — E538 Deficiency of other specified B group vitamins: Secondary | ICD-10-CM | POA: Diagnosis not present

## 2022-03-22 DIAGNOSIS — I10 Essential (primary) hypertension: Secondary | ICD-10-CM | POA: Diagnosis not present

## 2022-03-22 DIAGNOSIS — Z0000 Encounter for general adult medical examination without abnormal findings: Secondary | ICD-10-CM | POA: Diagnosis not present

## 2022-03-22 DIAGNOSIS — R42 Dizziness and giddiness: Secondary | ICD-10-CM | POA: Diagnosis not present

## 2022-03-22 DIAGNOSIS — E559 Vitamin D deficiency, unspecified: Secondary | ICD-10-CM

## 2022-03-22 DIAGNOSIS — E785 Hyperlipidemia, unspecified: Secondary | ICD-10-CM | POA: Diagnosis not present

## 2022-03-22 DIAGNOSIS — H539 Unspecified visual disturbance: Secondary | ICD-10-CM

## 2022-03-22 DIAGNOSIS — G3281 Cerebellar ataxia in diseases classified elsewhere: Secondary | ICD-10-CM

## 2022-03-22 DIAGNOSIS — F1020 Alcohol dependence, uncomplicated: Secondary | ICD-10-CM

## 2022-03-22 LAB — CBC WITH DIFFERENTIAL/PLATELET
Basophils Absolute: 0.1 10*3/uL (ref 0.0–0.1)
Basophils Relative: 1.2 % (ref 0.0–3.0)
Eosinophils Absolute: 0 10*3/uL (ref 0.0–0.7)
Eosinophils Relative: 0.7 % (ref 0.0–5.0)
HCT: 42.6 % (ref 39.0–52.0)
Hemoglobin: 14.4 g/dL (ref 13.0–17.0)
Lymphocytes Relative: 21.6 % (ref 12.0–46.0)
Lymphs Abs: 1.4 10*3/uL (ref 0.7–4.0)
MCHC: 33.7 g/dL (ref 30.0–36.0)
MCV: 96 fl (ref 78.0–100.0)
Monocytes Absolute: 0.6 10*3/uL (ref 0.1–1.0)
Monocytes Relative: 8.6 % (ref 3.0–12.0)
Neutro Abs: 4.5 10*3/uL (ref 1.4–7.7)
Neutrophils Relative %: 67.9 % (ref 43.0–77.0)
Platelets: 171 10*3/uL (ref 150.0–400.0)
RBC: 4.44 Mil/uL (ref 4.22–5.81)
RDW: 14 % (ref 11.5–15.5)
WBC: 6.7 10*3/uL (ref 4.0–10.5)

## 2022-03-22 LAB — COMPREHENSIVE METABOLIC PANEL
ALT: 15 U/L (ref 0–53)
AST: 20 U/L (ref 0–37)
Albumin: 4.3 g/dL (ref 3.5–5.2)
Alkaline Phosphatase: 60 U/L (ref 39–117)
BUN: 15 mg/dL (ref 6–23)
CO2: 32 mEq/L (ref 19–32)
Calcium: 9.4 mg/dL (ref 8.4–10.5)
Chloride: 102 mEq/L (ref 96–112)
Creatinine, Ser: 0.81 mg/dL (ref 0.40–1.50)
GFR: 82.5 mL/min (ref >60.00)
Glucose, Bld: 97 mg/dL (ref 70–99)
Potassium: 4.9 mEq/L (ref 3.5–5.1)
Sodium: 138 mEq/L (ref 135–145)
Total Bilirubin: 1.3 mg/dL — ABNORMAL HIGH (ref 0.2–1.2)
Total Protein: 6.6 g/dL (ref 6.0–8.3)

## 2022-03-22 LAB — VITAMIN B12: Vitamin B-12: >1504 pg/mL — ABNORMAL HIGH (ref 211–911)

## 2022-03-22 LAB — LIPID PANEL
Cholesterol: 139 mg/dL (ref 0–200)
HDL: 56.9 mg/dL (ref >39.00)
LDL Cholesterol: 67 mg/dL (ref 0–99)
NonHDL: 82.3
Total CHOL/HDL Ratio: 2
Triglycerides: 75 mg/dL (ref 0.0–149.0)
VLDL: 15 mg/dL (ref 0.0–40.0)

## 2022-03-22 LAB — TSH: TSH: 1.87 u[IU]/mL (ref 0.35–5.50)

## 2022-03-22 LAB — VITAMIN D 25 HYDROXY (VIT D DEFICIENCY, FRACTURES): VITD: 26.98 ng/mL — ABNORMAL LOW (ref 30.00–100.00)

## 2022-03-22 LAB — HEMOGLOBIN A1C: Hgb A1c MFr Bld: 5.3 % (ref 4.6–6.5)

## 2022-03-22 MED ORDER — DONEPEZIL HCL 5 MG PO TABS: 5.0000 mg | ORAL_TABLET | Freq: Every day | ORAL | 5 refills | Status: DC

## 2022-03-22 MED ORDER — VITAMIN D (ERGOCALCIFEROL) 1.25 MG (50000 UNIT) PO CAPS: 50000.0000 [IU] | ORAL_CAPSULE | ORAL | 0 refills | Status: AC

## 2022-03-22 MED ORDER — CYANOCOBALAMIN 1000 MCG/ML IJ SOLN
1000.0000 ug | Freq: Once | INTRAMUSCULAR | Status: AC
  Administered 2022-03-22: 1000 ug via INTRAMUSCULAR

## 2022-03-22 NOTE — Telephone Encounter (Signed)
Patients daughter called and stated had questions about [place] medication.  She wanted to know if he is still supposed to be taking the Aricept.  Shes cleaning and found an old pill bottle.

## 2022-03-22 NOTE — Progress Notes (Signed)
Established Patient Office Visit     CC/Reason for Visit: Annual preventive exam and subsequent Medicare wellness  HPI: Marcus Galloway is a 81 y.o. male who is coming in today for the above mentioned reasons. 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  He also has a significant history of alcoholism.  He is overdue for dental care.  He is requesting an ophthalmology referral.  He is due for flu, shingles and Tdap vaccines.  He elects to defer all cancer screening due to age and comorbidities.  He has been having what sounds like orthostatic hypotension.  He gets dizzy when he stands up all of a sudden.  Is afraid to leave the house by himself.  He still drinks heavily.   Past Medical/Surgical History: Past Medical History:  Diagnosis Date   Aortic valve disease    Atrial fibrillation (HCC)    BPH (benign prostatic hyperplasia)    Chronic neck pain    GERD (gastroesophageal reflux disease)    History of colonic polyps    Hyperlipidemia    Hypertension    Osteoarthritis    Persistent atrial fibrillation ([place])    Polio    Prostate cancer [facility])     Past Surgical History:  Procedure Laterality Date   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   CARDIOVERSION  09/2016   HERNIA REPAIR  1990   INSERTION PROSTATE RADIATION SEED  2002    Social History:  reports that he has quit smoking. His smoking use included cigarettes. He has a 90.00 pack-year smoking history. He has never used smokeless tobacco. He reports current alcohol use of about  1.0 standard drink per week. He reports that he does not use drugs.  Allergies: No Known Allergies  Family History:  Family History  Adopted: Yes  Family history unknown: Yes     Current Outpatient Medications:    atorvastatin (LIPITOR) 10 MG tablet, TAKE ONE TABLET BY MOUTH DAILY, Disp: 90 tablet, Rfl: 0   Cyanocobalamin (B-12 COMPLIANCE INJECTION IJ), Inject as directed., Disp: , Rfl:    donepezil (ARICEPT) 5 MG tablet, Take 1 tablet (5 mg total) by mouth at bedtime., Disp: 30 tablet, Rfl: 0   memantine (NAMENDA) 10 MG tablet, ONE TABLET BY MOUTH TWICE A DAY, Disp: 60 tablet, Rfl: 5   metoprolol tartrate (LOPRESSOR) 50 MG tablet, TAKE ONE TABLET BY MOUTH TWICE A DAY, Disp: 180 tablet, Rfl: 1   OMEPRAZOLE PO, Take 20 mg by mouth daily., Disp: , Rfl:    sertraline (ZOLOFT) 25 MG tablet, Take 1 tablet (25 mg total) by mouth daily., Disp: 90 tablet, Rfl: 1   tamsulosin (FLOMAX) 0.4 MG CAPS capsule, TAKE ONE CAPSULE BY MOUTH DAILY, Disp: 90 capsule, Rfl: 0  Review of Systems:  Constitutional: Denies fever, chills, diaphoresis, appetite change and fatigue.  HEENT: Denies photophobia, eye pain, redness, hearing loss, ear pain, congestion, sore throat, rhinorrhea, sneezing, mouth sores, trouble swallowing, neck pain, neck stiffness and tinnitus.   Respiratory: Denies SOB, DOE, cough, chest tightness,  and wheezing.   Cardiovascular: Denies chest pain, palpitations and leg swelling.  Gastrointestinal: Denies nausea, vomiting, abdominal pain, diarrhea, constipation,  blood in stool and abdominal distention.  Genitourinary: Denies dysuria, urgency, frequency, hematuria, flank pain and difficulty urinating.  Endocrine: Denies: hot or cold intolerance, sweats, changes in hair or nails, polyuria, polydipsia. Musculoskeletal: Denies myalgias, back pain, joint swelling, arthralgias and gait problem.  Skin: Denies pallor, rash and wound.  Neurological: Denies dizziness, seizures, syncope, weakness,  light-headedness, numbness and headaches.  Hematological: Denies adenopathy. Easy bruising, personal or family bleeding history  Psychiatric/Behavioral: Denies suicidal ideation, mood changes, confusion, nervousness, sleep disturbance and agitation    Physical Exam: Vitals:   03/22/22 1107  BP: 94/70  Pulse: 95  Temp: 97.6 F (36.4 C)  TempSrc: Oral  SpO2: 97%  Weight: 169 lb 8 oz (76.9 kg)  Height: 5' 5.25" (1.657 m)    Body mass index is 27.99 kg/m.   Constitutional: NAD, calm, comfortable, ambulates with a cane Eyes: PERRL, lids and conjunctivae normal, wears corrective lenses ENMT: Mucous membranes are moist. Posterior pharynx clear of any exudate or lesions. Normal dentition. Tympanic membrane is pearly white, no erythema or bulging. Neck: normal, supple, no masses, no thyromegaly Respiratory: clear to auscultation bilaterally, no wheezing, no crackles. Normal respiratory effort. No accessory muscle use.  Cardiovascular: Regular rate and rhythm, no murmurs / rubs / gallops. No extremity edema. 2+ pedal pulses. No carotid bruits.  Abdomen: no tenderness, no masses palpated. No hepatosplenomegaly. Bowel sounds positive.  Musculoskeletal: no clubbing / cyanosis. No joint deformity upper and lower extremities. Good ROM, no contractures. Normal muscle tone.  Skin: no rashes, lesions, ulcers. No induration Neurologic: CN 2-12 grossly intact. Sensation intact, DTR normal. Strength 5/5 in all 4.  Psychiatric: Normal judgment and insight. Alert and oriented x 3. Normal mood.   Subsequent Medicare wellness visit   1. Risk factors, based on past  M,S,F -cardiovascular disease risk factors include age, gender, history of hypertension and hyperlipidemia   2.  Physical activities: Sedentary   3.  Depression/mood: Moderate depression   4.  Hearing: Significant hearing issues bilaterally but refuses audiology referral   5.  ADL's: Mostly independent in ADLs   6.  Fall risk:  Moderate fall risk   7.  Home safety: No problems identified   8.  Height weight, and visual acuity: height and weight as above, vision:  Vision Screening   Right eye Left eye Both eyes  Without correction 20/63 20/40-1 20/32-1  With correction        9.  Counseling: Advised to update vaccination status as well as eye and dental care   10. Lab orders based on risk factors: Laboratory update will be reviewed   11. Referral : Ophthalmology   12. Care plan: Follow-up with me in 2 to 3 months   13. Cognitive assessment: Mild to moderate cognitive impairment   14. Screening: Patient provided with a written and personalized 5-10 year screening schedule in the AVS. yes   15. Provider List Update: PCP, cardiology Dr. Claiborne Billings, neurology Dr. Tomi Likens  16. Advance Directives: Full code   17. Opioids: Patient is not on any opioid prescriptions and has no risk factors for a substance use disorder.   [place] Office Visit from 03/22/2022 in [place] at [facility]  PHQ-9 Total Score 12          11/16/2020   10:15 AM 02/09/2021   11:10 AM 01/16/2022    1:44 PM 01/29/2022   10:55 AM 03/22/2022   11:36 AM  Fall Risk  Falls in the past year? '1 1 1 '$ 0  0  Was there an injury with Fall? 1 1 0 0 0  Fall Risk Category Calculator '3 2 1 '$ 0 0  Fall Risk Category High Moderate Low Low Low  Patient Fall Risk Level High fall risk  Moderate fall risk Low fall risk Low fall risk  Patient at Risk for Falls Due to   Impaired balance/gait  No Fall Risks  Fall risk Follow up   Falls evaluation completed  Falls evaluation completed     Impression and Plan:  Vision changes  - Plan: Ambulatory referral to Ophthalmology  Encounter for preventive health examination -Recommend routine eye and dental care. -Immunizations: Tdap, Shingrix, flu at pharmacy -Healthy lifestyle discussed in detail. -Labs to be updated today. -Colon cancer screening: 10/2013 -Breast cancer screening: Not  applicable -Cervical cancer screening: Not applicable -Lung cancer screening: Not applicable -Prostate cancer screening: Defers due to age -DEXA: Not applicable  Dizziness Primary hypertension -His blood pressure today is low at 94/70, suspect his dizziness is related to orthostatic hypotension.  He is on diltiazem 120 and metoprolol.  I will discontinue diltiazem and keep him on metoprolol only.  He will return in 8 to 12 weeks for follow-up.  Vitamin B12 deficiency  - Plan: Vitamin B12  Vitamin D deficiency  - Plan: VITAMIN D 25 Hydroxy (Vit-D Deficiency, Fractures)  Persistent atrial fibrillation (HCC)  - Plan: CBC with Differential/Platelet, Comprehensive metabolic panel, Hemoglobin A1c, TSH -Now off anticoagulation due to alcoholism and history of falls.  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  -Lab work today; will notify you once results are available.  -STOP diltiazem.  -Remember tdap and shingles vaccines. Check on bivalent COVID vaccine.  -Schedule follow up in 8-12 weeks for blood pressure.        Lelon Frohlich, MD [facility]

## 2022-03-22 NOTE — Telephone Encounter (Signed)
Advised patient daughter [name]  on 01-29-22 Dr.Jaffe added Memantine to the Donepezil . Patient should be taken both. Per daughter request Donepezil sent it.  Vita Barley

## 2022-03-22 NOTE — Patient Instructions (Signed)
-  Nice seeing you today!!  -Lab work today; will notify you once results are available.  -STOP diltiazem.  -Remember tdap and shingles vaccines. Check on bivalent COVID vaccine.  -Schedule follow up in 8-12 weeks for blood pressure.

## 2022-04-19 ENCOUNTER — Other Ambulatory Visit: Payer: Self-pay | Admitting: Cardiovascular Disease

## 2022-04-24 ENCOUNTER — Ambulatory Visit: Payer: Medicare Other

## 2022-04-24 ENCOUNTER — Ambulatory Visit: Payer: Medicare Other | Admitting: Cardiovascular Disease

## 2022-04-24 ENCOUNTER — Encounter: Payer: Self-pay | Admitting: Cardiovascular Disease

## 2022-04-24 ENCOUNTER — Ambulatory Visit (INDEPENDENT_AMBULATORY_CARE_PROVIDER_SITE_OTHER): Payer: Medicare Other

## 2022-04-24 VITALS — BP 117/64 | HR 63 | Ht 65.25 in | Wt 163.6 lb

## 2022-04-24 DIAGNOSIS — E538 Deficiency of other specified B group vitamins: Secondary | ICD-10-CM | POA: Diagnosis not present

## 2022-04-24 DIAGNOSIS — F02B0 Dementia in other diseases classified elsewhere, moderate, without behavioral disturbance, psychotic disturbance, mood disturbance, and anxiety: Secondary | ICD-10-CM

## 2022-04-24 DIAGNOSIS — I4821 Permanent atrial fibrillation: Secondary | ICD-10-CM | POA: Diagnosis not present

## 2022-04-24 DIAGNOSIS — Z952 Presence of prosthetic heart valve: Secondary | ICD-10-CM | POA: Diagnosis not present

## 2022-04-24 DIAGNOSIS — G309 Alzheimer's disease, unspecified: Secondary | ICD-10-CM | POA: Diagnosis not present

## 2022-04-24 DIAGNOSIS — E785 Hyperlipidemia, unspecified: Secondary | ICD-10-CM | POA: Diagnosis not present

## 2022-04-24 DIAGNOSIS — Z9181 History of falling: Secondary | ICD-10-CM

## 2022-04-24 MED ORDER — CYANOCOBALAMIN 1000 MCG/ML IJ SOLN
1000.0000 ug | Freq: Once | INTRAMUSCULAR | Status: AC
  Administered 2022-04-24: 1000 ug via INTRAMUSCULAR

## 2022-04-24 MED ORDER — ASPIRIN 81 MG PO TBEC: 81.0000 mg | DELAYED_RELEASE_TABLET | Freq: Every day | ORAL | 3 refills | Status: AC

## 2022-04-24 NOTE — Progress Notes (Cosign Needed Addendum)
Per orders of Dr. Jerilee Hoh, injection of Cyanocobalamin given by [facility]. Patient tolerated injection well.  Documenting late due to error yesterday 04/24/2022. A change had to be made to the appointment.

## 2022-04-24 NOTE — Progress Notes (Signed)
error 

## 2022-04-25 DIAGNOSIS — E538 Deficiency of other specified B group vitamins: Secondary | ICD-10-CM | POA: Diagnosis not present

## 2022-04-25 MED ORDER — CYANOCOBALAMIN 1000 MCG/ML IJ SOLN
1000.0000 ug | Freq: Once | INTRAMUSCULAR | Status: AC
  Administered 2022-04-25: 1000 ug via INTRAMUSCULAR

## 2022-04-25 NOTE — Addendum Note (Signed)
Addended byEncarnacion Slates on: 04/25/2022 10:12 AM   Modules accepted: Orders

## 2022-05-10 ENCOUNTER — Encounter: Payer: Medicare Other | Admitting: Family Medicine

## 2022-05-14 ENCOUNTER — Encounter: Payer: Self-pay | Admitting: Neurology

## 2022-05-17 ENCOUNTER — Ambulatory Visit: Payer: Medicare Other | Admitting: Licensed Clinical Social Worker

## 2022-05-17 ENCOUNTER — Other Ambulatory Visit: Payer: Self-pay | Admitting: Internal Medicine

## 2022-05-17 DIAGNOSIS — E781 Pure hyperglyceridemia: Secondary | ICD-10-CM

## 2022-05-17 NOTE — Chronic Care Management (AMB) (Signed)
Care Management Clinical Social Work Note  05/17/2022 Name: Marcus Galloway MRN: [id number] DOB: 1941/08/16  Marcus Galloway is a 81 y.o. year old male who is a primary care patient of Marcus Galloway, Marcus Halsted, MD.  The Care Management team was consulted for assistance with chronic disease management and coordination needs.  Engaged with patient's daughter by telephone for follow up visit in response to provider referral for social work chronic care management and care coordination services  Consent to Services:  Marcus Galloway was given information about Care Management services today including:  Care Management services includes personalized support from designated clinical staff supervised by his physician, including individualized plan of care and coordination with other care providers 24/7 contact phone numbers for assistance for urgent and routine care needs. The patient may stop case management services at any time by phone call to the office staff.  Patient agreed to services and consent obtained.   Assessment: Review of patient past medical history, allergies, medications, and health status, including review of relevant consultants reports was performed today as part of a comprehensive evaluation and provision of chronic care management and care coordination services.  SDOH (Social Determinants of Health) assessments and interventions performed:    Advanced Directives Status: Not addressed in this encounter.  Care Plan  No Known Allergies  Outpatient Encounter Medications as of 05/17/2022  Medication Sig   aspirin EC 81 MG tablet Take 1 tablet (81 mg total) by mouth daily. Swallow whole.   atorvastatin (LIPITOR) 10 MG tablet TAKE ONE TABLET BY MOUTH DAILY   Cyanocobalamin (B-12 COMPLIANCE INJECTION IJ) Inject as directed.   donepezil (ARICEPT) 5 MG tablet Take 1 tablet (5 mg total) by mouth at bedtime.   memantine (NAMENDA) 10 MG tablet ONE TABLET BY MOUTH TWICE A DAY   metoprolol  tartrate (LOPRESSOR) 50 MG tablet TAKE ONE TABLET BY MOUTH TWICE A DAY   OMEPRAZOLE PO Take 20 mg by mouth daily.   sertraline (ZOLOFT) 25 MG tablet Take 1 tablet (25 mg total) by mouth daily.   tamsulosin (FLOMAX) 0.4 MG CAPS capsule TAKE ONE CAPSULE BY MOUTH DAILY   Vitamin D, Ergocalciferol, (DRISDOL) 1.25 MG (50000 UNIT) CAPS capsule Take 1 capsule (50,000 Units total) by mouth every 7 (seven) days for 12 doses.   No facility-administered encounter medications on file as of 05/17/2022.    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[place]), emphysema 05/18/2017   Degenerative arthritis of lumbar spine 05/16/2017   BPH with obstruction/lower urinary tract symptoms, on Flomax 01/15/2017   Arthralgia of multiple joints, controlled with 1.5 Percocet daily 01/14/2017   GERD (gastroesophageal reflux disease), on Omeprazole 01/14/2017   Prostate cancer ([place]) 01/14/2017   Persistent atrial fibrillation ([place]), on Coumadin 01/14/2017   Rosacea, controlled with daily Doxycycline 01/14/2017   Degenerative arthritis of cervical spine 01/14/2017   Hyperlipidemia, on Lipitor    S/P aortic valve replacement 10/17/2012   CHF, NYHA class II ([place]), EF 60-65% 10/14/2012    Conditions to be addressed/monitored: HTN, COPD, and Alcohol Dependence ;  Memory Deficits  Care Plan : LCSW Plan of Care  Updates made by Rebekah Chesterfield, LCSW since 05/17/2022 12:00 AM     Problem: Coping Skills (General Plan of  Care)      Long-Range Goal: Coping Skills Enhanced Completed 05/17/2022  Start Date: 01/12/2022  Expected End Date: 05/28/2022  This Visit's Progress: On track  Recent Progress: On track  Priority: High  Note:   Current barriers:   Severe Persistent Mental Health needs related to Dementia and Alcohol Dependence Mental Health Concerns , Memory Deficits, Inability to perform ADL's independently, and Inability to perform IADL's independently Needs Support, Education, and Care Coordination in order to meet unmet mental health needs. Clinical Goal(s): verbalize understanding of plan for management of HTN, COPD, and Dementia   Clinical Interventions:  Assessed patient's previous and current treatment, coping skills, support system and barriers to care. Pt's daughter, Marcus Galloway, provided all information during encounter Pt has decided to maintain Marcus Galloway as PCP office Pt is establishing new friendships in complex and is increasing social activities outside of the home Pt continues compliance with meds Memory continues to decline noting both long-term and short-term; however, he continues to receive strong support from family Depression and Anxiety symptoms have decreased Patient continues to drink alcohol; however, Marcus Galloway and siblings are monitoring his intake Solution-Focused Strategies employed: LCSW discussed strategies to assist pt with identifying protective factors Active listening / Reflection utilized  Emotional Support Provided Provided psychoeducation for mental health needs  Caregiver stress acknowledged  Consideration of in-home help encouraged : options discussed Verbalization of feelings encouraged  Discussed caregiver resources and support: LCSW will provide Industrial/product designer  Inter-disciplinary care team collaboration (see longitudinal plan of care) Patient Goals/Self-Care Activities: Over the next 120 days Attend all scheduled medical appointments Utilize  healthy coping skills and/or supportive resources discussed Contact PCP office with any questions or concerns        Follow Up Plan: No follow up required  [name], MSW, [facility] [email or web address] Phone [phone] 11:59 AM

## 2022-05-17 NOTE — Patient Instructions (Signed)
Visit Information  Thank you for taking time to visit with me today. Please don't hesitate to contact me if I can be of assistance to you before our next scheduled telephone appointment.  Following are the goals we discussed today:  Patient Goals/Self-Care Activities: Over the next 120 days Attend all scheduled medical appointments Utilize healthy coping skills and/or supportive resources discussed Contact PCP office with any questions or concerns  No follow up required  If you are experiencing a Mental Health or Real or need someone to talk to, please call the Suicide and Crisis Lifeline: 988 call 911   Patient verbalizes understanding of instructions and care plan provided today and agrees to view in [place]. Active MyChart status and patient understanding of how to access instructions and care plan via MyChart confirmed with patient.     [name], MSW, [facility] [email or web address] Phone [phone] 11:59 AM

## 2022-05-19 ENCOUNTER — Other Ambulatory Visit: Payer: Self-pay | Admitting: Internal Medicine

## 2022-05-22 ENCOUNTER — Ambulatory Visit (INDEPENDENT_AMBULATORY_CARE_PROVIDER_SITE_OTHER): Payer: Medicare Other

## 2022-05-22 DIAGNOSIS — E538 Deficiency of other specified B group vitamins: Secondary | ICD-10-CM

## 2022-05-22 MED ORDER — CYANOCOBALAMIN 1000 MCG/ML IJ SOLN
1000.0000 ug | Freq: Once | INTRAMUSCULAR | Status: AC
  Administered 2022-05-22: 1000 ug via INTRAMUSCULAR

## 2022-05-22 NOTE — Progress Notes (Signed)
Per orders of Dr. Jerilee Hoh, injection of VitB12 1000 mcg given by Encarnacion Slates. Patient tolerated injection well.  Patient is scheduled on 06/26/2022 for next VitB12 inject.

## 2022-06-11 ENCOUNTER — Other Ambulatory Visit: Payer: Self-pay | Admitting: Internal Medicine

## 2022-06-11 DIAGNOSIS — E559 Vitamin D deficiency, unspecified: Secondary | ICD-10-CM

## 2022-06-20 DIAGNOSIS — H43811 Vitreous degeneration, right eye: Secondary | ICD-10-CM | POA: Diagnosis not present

## 2022-06-26 ENCOUNTER — Ambulatory Visit (INDEPENDENT_AMBULATORY_CARE_PROVIDER_SITE_OTHER): Payer: Medicare Other

## 2022-06-26 DIAGNOSIS — E538 Deficiency of other specified B group vitamins: Secondary | ICD-10-CM

## 2022-06-26 MED ORDER — CYANOCOBALAMIN 1000 MCG/ML IJ SOLN
1000.0000 ug | Freq: Once | INTRAMUSCULAR | Status: AC
  Administered 2022-06-26: 1000 ug via INTRAMUSCULAR

## 2022-06-26 NOTE — Progress Notes (Signed)
Pt here for monthly B12 injection per Dr Jerilee Hoh  B12 1049mg given IM in left deltoid, and pt tolerated injection well.  Next B12 injection scheduled for next month.

## 2022-06-29 ENCOUNTER — Encounter: Payer: Self-pay | Admitting: Family

## 2022-06-29 ENCOUNTER — Ambulatory Visit (INDEPENDENT_AMBULATORY_CARE_PROVIDER_SITE_OTHER): Payer: Medicare Other | Admitting: Family

## 2022-06-29 VITALS — BP 104/60 | HR 58 | Temp 98.7°F | Ht 65.25 in | Wt 161.4 lb

## 2022-06-29 DIAGNOSIS — K4090 Unilateral inguinal hernia, without obstruction or gangrene, not specified as recurrent: Secondary | ICD-10-CM

## 2022-06-29 NOTE — Patient Instructions (Signed)
It was very nice to see you today!   I have sent a referral to [facility] to discuss your options regarding your hernia.   You can take 2 extra strength Tylenol 2-3 times per day to help with pain, and recommend taking a dose at bedtime to prevent pain overnight.       PLEASE NOTE:  If you had any lab tests please let us know if you have not heard back within a few days. You may see your results on MyChart before we have a chance to review them but we will give you a call once they are reviewed by [place]. If we ordered any referrals today, please let us know if you have not heard from their office within the next week.

## 2022-06-29 NOTE — Progress Notes (Signed)
Patient ID: Marcus Galloway, male    DOB: 09-02-1941, 81 y.o.   MRN: [id number]  Chief Complaint  Patient presents with   Testicle Pain    Pt stated that he has been experiencing some rt testicle pain since 06/28/2022 along with some bulging on the Rt side in the groining area.    HPI: Right testicle pain:  pt present with daughter and reports off and on pain for months, but last night he had severe pain in testicle, denies color changes or swelling. Reports having lower abdominal hernia repair x 2 many years ago, and can feel a knot in the area that is mildly tender. Denies any previous inguinal hernia hx.    Assessment & Plan:  1. Inguinal hernia of right side without obstruction or gangrene palpable bulge, non-retractable, causing pt severe scrotal pain. Advised pt on no heavy lifting, leaning over, heavy pulling or pushing. Can take Tylenol 1,'000mg'$  tid, should take 1 of the doses at bedtime to offset overnight pain.  - Ambulatory referral to General Surgery   Subjective:    Outpatient Medications Prior to Visit  Medication Sig Dispense Refill   aspirin EC 81 MG tablet Take 1 tablet (81 mg total) by mouth daily. Swallow whole. 90 tablet 3   atorvastatin (LIPITOR) 10 MG tablet TAKE ONE TABLET BY MOUTH DAILY 90 tablet 0   Cyanocobalamin (B-12 COMPLIANCE INJECTION IJ) Inject as directed.     donepezil (ARICEPT) 5 MG tablet Take 1 tablet (5 mg total) by mouth at bedtime. 30 tablet 5   memantine (NAMENDA) 10 MG tablet ONE TABLET BY MOUTH TWICE A DAY 60 tablet 5   metoprolol tartrate (LOPRESSOR) 50 MG tablet TAKE ONE TABLET BY MOUTH TWICE A DAY 180 tablet 1   OMEPRAZOLE PO Take 20 mg by mouth daily.     sertraline (ZOLOFT) 25 MG tablet Take 1 tablet (25 mg total) by mouth daily. 90 tablet 1   tamsulosin (FLOMAX) 0.4 MG CAPS capsule TAKE ONE CAPSULE BY MOUTH DAILY 90 capsule 1   No facility-administered medications prior to visit.   Past Medical History:  Diagnosis Date   Aortic valve  disease    Atrial fibrillation (HCC)    BPH (benign prostatic hyperplasia)    Chronic neck pain    GERD (gastroesophageal reflux disease)    History of colonic polyps    Hyperlipidemia    Hypertension    Osteoarthritis    Persistent atrial fibrillation (HCC)    Polio    Prostate cancer [facility])    Past Surgical History:  Procedure Laterality Date   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   CARDIOVERSION  09/2016   HERNIA REPAIR  1990   INSERTION PROSTATE RADIATION SEED  2002   No Known Allergies    Objective:    Physical Exam Vitals and nursing note reviewed. Exam conducted with a chaperone present.  Constitutional:      General: He is not in acute distress.    Appearance: Normal appearance.  HENT:     Head: Normocephalic.  Cardiovascular:     Rate and Rhythm: Normal rate and regular rhythm.  Pulmonary:     Effort: Pulmonary effort is normal.     Breath sounds: Normal breath sounds.  Abdominal:     Hernia: A hernia is present. Hernia is present in the right inguinal area.  Genitourinary:    Penis: Normal.      Testes:  Right: Tenderness present. Swelling or testicular hydrocele not present.        Left: Tenderness present. Swelling or testicular hydrocele not present.    Musculoskeletal:        General: Normal range of motion.     Cervical back: Normal range of motion.  Skin:    General: Skin is warm and dry.  Neurological:     Mental Status: He is alert and oriented to person, place, and time.  Psychiatric:        Mood and Affect: Mood normal.    BP 104/60   Pulse (!) 58   Temp 98.7 F (37.1 C)   Ht 5' 5.25" (1.657 m)   Wt 161 lb 6.4 oz (73.2 kg)   SpO2 98%   BMI 26.65 kg/m  Wt Readings from Last 3 Encounters:  06/29/22 161 lb 6.4 oz (73.2 kg)  04/24/22 163 lb 9.6 oz (74.2 kg)  03/22/22 169 lb 8 oz (76.9 kg)       Jeanie Sewer, NP

## 2022-06-30 ENCOUNTER — Other Ambulatory Visit: Payer: Self-pay | Admitting: Internal Medicine

## 2022-07-10 ENCOUNTER — Other Ambulatory Visit: Payer: Self-pay | Admitting: Internal Medicine

## 2022-07-10 DIAGNOSIS — F321 Major depressive disorder, single episode, moderate: Secondary | ICD-10-CM

## 2022-07-24 ENCOUNTER — Ambulatory Visit (INDEPENDENT_AMBULATORY_CARE_PROVIDER_SITE_OTHER): Payer: Medicare Other | Admitting: *Deleted

## 2022-07-24 DIAGNOSIS — E538 Deficiency of other specified B group vitamins: Secondary | ICD-10-CM

## 2022-07-24 DIAGNOSIS — K4090 Unilateral inguinal hernia, without obstruction or gangrene, not specified as recurrent: Secondary | ICD-10-CM | POA: Diagnosis not present

## 2022-07-24 MED ORDER — CYANOCOBALAMIN 1000 MCG/ML IJ SOLN
1000.0000 ug | Freq: Once | INTRAMUSCULAR | Status: AC
  Administered 2022-07-24: 1000 ug via INTRAMUSCULAR

## 2022-07-24 NOTE — Progress Notes (Signed)
Per orders of Dr. Hernandez, injection of B12 given by Jamelle Noy. Patient tolerated injection well.  

## 2022-08-09 ENCOUNTER — Telehealth: Payer: Self-pay | Admitting: *Deleted

## 2022-08-09 ENCOUNTER — Telehealth: Payer: Self-pay | Admitting: Internal Medicine

## 2022-08-09 ENCOUNTER — Telehealth: Payer: Self-pay | Admitting: Cardiovascular Disease

## 2022-08-09 NOTE — Telephone Encounter (Signed)
I s/w the pt's daughter [name], she has set appt 08/14/22 @ 10:40 for tele pre op appt. She states her dad went to PCP 06/29/22 for the abdominal pain and they said he has a hernia. She states the pain is getting intense and her dad has almost gone to the ED a couple of times because of the pain. She states he is seeing PCP tomorrow to see if they can give him some pain medication to help until he can have his surgery. Our first tele appt was 08/16/22, though daughter asked if we could even a little sooner. I added him onto 08/14/22 @ 10:40. Pt daughter grateful for the help that we have given to help her dad.   Med rec and consent are done.

## 2022-08-09 NOTE — Telephone Encounter (Signed)
Pt's surgery is being delayed, patient is in pain, seeking guidance on how he can manage his pain while waiting for surgery.

## 2022-08-09 NOTE — Telephone Encounter (Signed)
Patient's daughter [name] informed of the message and verbalized understanding

## 2022-08-09 NOTE — Telephone Encounter (Signed)
  Patient Consent for Virtual Visit        Marcus Galloway has provided verbal consent on 08/09/2022 for a virtual visit (video or telephone).   CONSENT FOR VIRTUAL VISIT FOR:  Marcus Galloway  By participating in this virtual visit I agree to the following:  I hereby voluntarily request, consent and authorize [facility] and its employed or contracted physicians, physician assistants, nurse practitioners or other licensed health care professionals (the Practitioner), to provide me with telemedicine health care services (the "Services") as deemed necessary by the treating Practitioner. I acknowledge and consent to receive the Services by the Practitioner via telemedicine. I understand that the telemedicine visit will involve communicating with the Practitioner through live audiovisual communication technology and the disclosure of certain medical information by electronic transmission. I acknowledge that I have been given the opportunity to request an in-person assessment or other available alternative prior to the telemedicine visit and am voluntarily participating in the telemedicine visit.  I understand that I have the right to withhold or withdraw my consent to the use of telemedicine in the course of my care at any time, without affecting my right to future care or treatment, and that the Practitioner or I may terminate the telemedicine visit at any time. I understand that I have the right to inspect all information obtained and/or recorded in the course of the telemedicine visit and may receive copies of available information for a reasonable fee.  I understand that some of the potential risks of receiving the Services via telemedicine include:  Delay or interruption in medical evaluation due to technological equipment failure or disruption; Information transmitted may not be sufficient (e.g. poor resolution of images) to allow for appropriate medical decision making by the Practitioner;  and/or  In rare instances, security protocols could fail, causing a breach of personal health information.  Furthermore, I acknowledge that it is my responsibility to provide information about my medical history, conditions and care that is complete and accurate to the best of my ability. I acknowledge that Practitioner's advice, recommendations, and/or decision may be based on factors not within their control, such as incomplete or inaccurate data provided by me or distortions of diagnostic images or specimens that may result from electronic transmissions. I understand that the practice of medicine is not an exact science and that Practitioner makes no warranties or guarantees regarding treatment outcomes. I acknowledge that a copy of this consent can be made available to me via my patient portal ([place]), or I can request a printed copy by calling the office of [facility].    I understand that my insurance will be billed for this visit.   I have read or had this consent read to me. I understand the contents of this consent, which adequately explains the benefits and risks of the Services being provided via telemedicine.  I have been provided ample opportunity to ask questions regarding this consent and the Services and have had my questions answered to my satisfaction. I give my informed consent for the services to be provided through the use of telemedicine in my medical care

## 2022-08-09 NOTE — Telephone Encounter (Signed)
Appointment scheduled.

## 2022-08-09 NOTE — Telephone Encounter (Signed)
I called number for the pt and it is his daughter [name] [facility]) phone. I s/w [name] in regard to need to set up a tele pre op appt for her dad. [name] asked if I could call back in a little bit as she is picking her kids up from school right now. I said that will be fine.

## 2022-08-09 NOTE — Telephone Encounter (Signed)
   Name: Marcus Galloway  DOB: 1941-07-21  MRN: [id number]  Primary Cardiologist: Shelva Majestic, MD  Chart reviewed as part of pre-operative protocol coverage. Because of Marcus Galloway's past medical history and time since last visit, he will require a follow-up telephone visit in order to better assess preoperative cardiovascular risk.  Pre-op covering staff: - Please schedule appointment and call patient to inform them. If patient already had an upcoming appointment within acceptable timeframe, please add "pre-op clearance" to the appointment notes so provider is aware. - Please contact requesting surgeon's office via preferred method (i.e, phone, fax) to inform them of need for appointment prior to surgery.  No medications need held.  Marcus Collard, PA-C  08/09/2022, 1:20 PM

## 2022-08-09 NOTE — Telephone Encounter (Signed)
   Pre-operative Risk Assessment    Patient Name: Marcus Galloway  DOB: 02-01-1941 MRN: [id number]      Request for Surgical Clearance    Procedure:   hernia surgery   Date of Surgery:  Clearance TBD                                 Surgeon:  Dr. Harriet Pho Group or Practice Name:  [facility]  Phone number:  [phone] Fax number:  [phone]   Type of Clearance Requested:   - Medical    Type of Anesthesia:  General    Additional requests/questions:   notes are in care everywhere  Signed, Milbert Coulter   08/09/2022, 10:39 AM

## 2022-08-10 ENCOUNTER — Encounter: Payer: Self-pay | Admitting: Adult Health

## 2022-08-10 ENCOUNTER — Ambulatory Visit (INDEPENDENT_AMBULATORY_CARE_PROVIDER_SITE_OTHER): Payer: Medicare Other | Admitting: Adult Health

## 2022-08-10 VITALS — BP 90/60 | Temp 97.9°F | Ht 65.25 in | Wt 161.0 lb

## 2022-08-10 DIAGNOSIS — K4090 Unilateral inguinal hernia, without obstruction or gangrene, not specified as recurrent: Secondary | ICD-10-CM | POA: Diagnosis not present

## 2022-08-10 DIAGNOSIS — R110 Nausea: Secondary | ICD-10-CM | POA: Diagnosis not present

## 2022-08-10 MED ORDER — TRAMADOL HCL 50 MG PO TABS: 50.0000 mg | ORAL_TABLET | Freq: Two times a day (BID) | ORAL | 0 refills | Status: DC | PRN

## 2022-08-10 MED ORDER — ONDANSETRON HCL 4 MG PO TABS: 4.0000 mg | ORAL_TABLET | Freq: Three times a day (TID) | ORAL | 0 refills | Status: DC | PRN

## 2022-08-10 NOTE — Progress Notes (Signed)
Subjective:    Patient ID: Marcus Galloway, male    DOB: Jul 10, 1941, 81 y.o.   MRN: [id number]  HPI 81 year old male who  has a past medical history of Aortic valve disease, Atrial fibrillation ([place]), BPH (benign prostatic hyperplasia), Chronic neck pain, GERD (gastroesophageal reflux disease), History of colonic polyps, Hyperlipidemia, Hypertension, Osteoarthritis, Persistent atrial fibrillation ([place]), Polio, and Prostate cancer ([place]).  He presents with his daughter today, he is a patient of Dr. Jerilee Hoh.  Who will be having hernia surgery soon after he is cleared by cardiology ( appointment next week) He has a right sided inguinal hernia that has become very painful.  He denies any issues with bowel or bladder, is able to reduce the hernia but is painful when he does so.  He does report pain eases up at times but he is mostly in pain all the time.  When the pain is really bad it is "the worst pain of ever had".  At home he has tried using Tylenol Extra Strength 3 times daily which did not improve his symptoms.  Sometimes the pain can cause nausea but he does not have any vomiting associated with it.  Review of Systems See HPI   Past Medical History:  Diagnosis Date   Aortic valve disease    Atrial fibrillation (HCC)    BPH (benign prostatic hyperplasia)    Chronic neck pain    GERD (gastroesophageal reflux disease)    History of colonic polyps    Hyperlipidemia    Hypertension    Osteoarthritis    Persistent atrial fibrillation (H[facility])    Polio    Prostate cancer ([place])     Social History   Socioeconomic History   Marital status: Widowed    Spouse name: Not on file   Number of children: 4   Years of education: 50   Highest education level: Not on file  Occupational History   Occupation: Retired  Tobacco Use   Smoking status: Former    Packs/day: 3.00    Years: 30.00    Total pack years: 90.00    Types: Cigarettes   Smokeless tobacco: Never   Tobacco comments:    quit  smoking 1980  Vaping Use   Vaping Use: Never used  Substance and Sexual Activity   Alcohol use: Yes    Alcohol/week: 1.0 standard drink of alcohol    Types: 1 Glasses of wine per week    Comment: occasion   Drug use: No   Sexual activity: Not Currently  Other Topics Concern   Not on file  Social History Narrative   Lives alone, recently moved back to [place] from [facility] to be closer to his children   Fist floor of apartment complex   Right-handed   Caffeine: 3 cups each morning   Social Determinants of Health   Financial Resource Strain: Not on file  Food Insecurity: Not on file  Transportation Needs: Not on file  Physical Activity: Not on file  Stress: Not on file  Social Connections: Not on file  Intimate Partner Violence: Not on file    Past Surgical History:  Procedure Laterality Date   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   CARDIOVERSION  09/2016   HERNIA REPAIR  1990   INSERTION PROSTATE RADIATION SEED  2002    Family History  Adopted: Yes  Family history unknown: Yes    No Known Allergies  Current Outpatient Medications on File Prior to Visit  Medication Sig Dispense Refill   aspirin EC 81 MG tablet Take 1 tablet (81 mg total) by mouth daily. Swallow whole. 90 tablet 3   atorvastatin (LIPITOR) 10 MG tablet TAKE ONE TABLET BY MOUTH DAILY 90 tablet 0   Cyanocobalamin (B-12 COMPLIANCE INJECTION IJ) Inject as directed.     donepezil (ARICEPT) 5 MG tablet Take 1 tablet (5 mg total) by mouth at bedtime. 30 tablet 5   memantine (NAMENDA) 10 MG tablet ONE TABLET BY MOUTH TWICE A DAY 60 tablet 5   metoprolol tartrate (LOPRESSOR) 50 MG tablet TAKE ONE TABLET BY MOUTH TWICE A DAY 180 tablet 0   OMEPRAZOLE PO Take 20 mg by mouth daily.     sertraline (ZOLOFT) 25 MG tablet TAKE ONE TABLET BY MOUTH DAILY 90 tablet 0   tamsulosin (FLOMAX) 0.4 MG CAPS capsule TAKE ONE CAPSULE BY MOUTH DAILY 90 capsule 1   No current facility-administered medications on  file prior to visit.    BP 90/60   Temp 97.9 F (36.6 C) (Oral)   Ht 5' 5.25" (1.657 m)   Wt 161 lb (73 kg)   BMI 26.59 kg/m       Objective:   Physical Exam Vitals and nursing note reviewed.  Constitutional:      Appearance: Normal appearance.  Abdominal:     General: Abdomen is flat. Bowel sounds are normal.     Palpations: Abdomen is soft.     Tenderness: There is abdominal tenderness.     Hernia: A hernia is present.  Skin:    General: Skin is warm and dry.     Capillary Refill: Capillary refill takes less than 2 seconds.  Neurological:     General: No focal deficit present.     Mental Status: He is alert and oriented to person, place, and time.        Assessment & Plan:  1. Inguinal hernia of right side without obstruction or gangrene -Hernia is easily reduced but is quite painful for the patient when done.  No bruising on abdomen noted.  Will prescribe a short course of tramadol that he can take as needed.  Advised take a stool softener with each dose of tramadol.  Follow-up with his PCP if more pain medication as needed. - traMADol (ULTRAM) 50 MG tablet; Take 1 tablet (50 mg total) by mouth every 12 (twelve) hours as needed.  Dispense: 45 tablet; Refill: 0  2. Nausea  - ondansetron (ZOFRAN) 4 MG tablet; Take 1 tablet (4 mg total) by mouth every 8 (eight) hours as needed for nausea or vomiting.  Dispense: 20 tablet; Refill: 0  Dorothyann Peng, NP

## 2022-08-10 NOTE — Patient Instructions (Signed)
It was great meeting you today   I have sent in a course of Tramadol for you . Make sure you take a stool softener with every dose.

## 2022-08-14 ENCOUNTER — Ambulatory Visit: Payer: Medicare Other | Admitting: Internal Medicine

## 2022-08-14 ENCOUNTER — Ambulatory Visit: Payer: Self-pay | Admitting: General Surgery

## 2022-08-14 ENCOUNTER — Ambulatory Visit: Payer: Medicare Other | Attending: Cardiovascular Disease | Admitting: Nurse Practitioner

## 2022-08-14 DIAGNOSIS — Z01810 Encounter for preprocedural cardiovascular examination: Secondary | ICD-10-CM

## 2022-08-14 NOTE — Progress Notes (Signed)
Virtual Visit via Telephone Note   Because of Marcus Galloway's co-morbid illnesses, he is at least at moderate risk for complications without adequate follow up.  This format is felt to be most appropriate for this patient at this time.  The patient did not have access to video technology/had technical difficulties with video requiring transitioning to audio format only (telephone).  All issues noted in this document were discussed and addressed.  No physical exam could be performed with this format.  Please refer to the patient's chart for his consent to telehealth for [facility].  Evaluation Performed:  Preoperative cardiovascular risk assessment _____________   Date:  08/14/2022   Patient ID:  Marcus Galloway, DOB Feb 24, 1941, MRN [id number] Patient Location:  Home Provider location:   Office  Primary Care Provider:  Isaac Bliss, Rayford Halsted, MD Primary Cardiologist:  Shelva Majestic, MD  Chief Complaint / Patient Profile   81 y.o. y/o male with a h/o aortic valve disease s/p aortic valve replacement in December 2013, permanent atrial fibrillation, hypertension, hyperlipidemia, falls, prostate cancer, polio, and moderate Alzheimer's dementia who is pending hernia surgery with Dr. Marlou Starks of [facility] and presents today for telephonic preoperative cardiovascular risk assessment.  Past Medical History    Past Medical History:  Diagnosis Date   Aortic valve disease    Atrial fibrillation (HCC)    BPH (benign prostatic hyperplasia)    Chronic neck pain    GERD (gastroesophageal reflux disease)    History of colonic polyps    Hyperlipidemia    Hypertension    Osteoarthritis    Persistent atrial fibrillation ([place])    Polio    Prostate cancer [facility])    Past Surgical History:  Procedure Laterality Date   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   CARDIOVERSION  09/2016   HERNIA REPAIR  1990   INSERTION PROSTATE RADIATION SEED  2002     Allergies  No Known Allergies  History of Present Illness    Marcus Galloway is a 81 y.o. male who presents via audio/video conferencing for a telehealth visit today. Pt was last seen in cardiology clinic on 04/24/2022 by Dr. Claiborne Billings. At that time Marcus Galloway was doing well. The patient is now pending procedure as outlined above. Since his last visit, he hs been stable from a cardiac standpoint.  He does note significant pain related to his hernia.  He is very eager to have surgery.He denies chest pain, palpitations, dyspnea, pnd, orthopnea, n, v, dizziness, syncope, edema, weight gain, or early satiety. All other systems reviewed and are otherwise negative except as noted above.   Home Medications    Prior to Admission medications   Medication Sig Start Date End Date Taking? Authorizing Provider  aspirin EC 81 MG tablet Take 1 tablet (81 mg total) by mouth daily. Swallow whole. 04/24/22   Troy Sine, MD  atorvastatin (LIPITOR) 10 MG tablet TAKE ONE TABLET BY MOUTH DAILY 05/17/22   Isaac Bliss, Rayford Halsted, MD  Cyanocobalamin (B-12 COMPLIANCE INJECTION IJ) Inject as directed.    [provider]  donepezil (ARICEPT) 5 MG tablet Take 1 tablet (5 mg total) by mouth at bedtime. 03/22/22   Pieter Partridge, DO  memantine (NAMENDA) 10 MG tablet ONE TABLET BY MOUTH TWICE A DAY 02/26/22   Tomi Likens, Adam R, DO  metoprolol tartrate (LOPRESSOR) 50 MG tablet TAKE ONE TABLET BY MOUTH TWICE A DAY 07/03/22   Isaac Bliss, Rayford Halsted, MD  OMEPRAZOLE PO Take 20 mg by mouth daily.    [provider]  ondansetron (ZOFRAN) 4 MG tablet Take 1 tablet (4 mg total) by mouth every 8 (eight) hours as needed for nausea or vomiting. 08/10/22   Nafziger, Tommi Rumps, NP  sertraline (ZOLOFT) 25 MG tablet TAKE ONE TABLET BY MOUTH DAILY 07/10/22   Isaac Bliss, Rayford Halsted, MD  tamsulosin (FLOMAX) 0.4 MG CAPS capsule TAKE ONE CAPSULE BY MOUTH DAILY 05/21/22   Isaac Bliss, Rayford Halsted, MD  traMADol (ULTRAM) 50 MG  tablet Take 1 tablet (50 mg total) by mouth every 12 (twelve) hours as needed. 08/10/22   Dorothyann Peng, NP    Physical Exam    Vital Signs:  Marcus Galloway does not have vital signs available for review today.  Given telephonic nature of communication, physical exam is limited. AAOx3. NAD. Normal affect.  Speech and respirations are unlabored.  Accessory Clinical Findings    None  Assessment & Plan    1.  Preoperative Cardiovascular Risk Assessment:  According to the Revised Cardiac Risk Index (RCRI), his Perioperative Risk of Major Cardiac Event is (%): 0.4. His Functional Capacity in METs is: 4.4 according to the [place] Activity Status Index (DASI).Therefore, based on ACC/AHA guidelines, patient would be at acceptable risk for the planned procedure without further cardiovascular testing.  The patient was advised that if he develops new symptoms prior to surgery to contact our office to arrange for a follow-up visit, and he verbalized understanding.  Regarding ASA therapy, we recommend continuation of ASA throughout the perioperative period.  However, if the surgeon feels that cessation of ASA is required in the perioperative period, it may be stopped 5-7 days prior to surgery with a plan to resume it as soon as felt to be feasible from a surgical standpoint in the post-operative period.  A copy of this note will be routed to requesting surgeon.  Time:   Today, I have spent 9 minutes with the patient with telehealth technology discussing medical history, symptoms, and management plan.     Lenna Sciara, NP  08/14/2022, 10:53 AM

## 2022-08-15 ENCOUNTER — Other Ambulatory Visit: Payer: Self-pay | Admitting: Internal Medicine

## 2022-08-15 DIAGNOSIS — E781 Pure hyperglyceridemia: Secondary | ICD-10-CM

## 2022-08-17 ENCOUNTER — Emergency Department (HOSPITAL_COMMUNITY)
Admission: EM | Admit: 2022-08-17 | Discharge: 2022-08-17 | Disposition: A | Discharge status: Home / Self Care | Payer: Medicare Other | Attending: Emergency Medicine | Admitting: Emergency Medicine

## 2022-08-17 ENCOUNTER — Encounter (HOSPITAL_COMMUNITY): Payer: Self-pay

## 2022-08-17 ENCOUNTER — Other Ambulatory Visit: Payer: Self-pay

## 2022-08-17 ENCOUNTER — Emergency Department (HOSPITAL_COMMUNITY): Payer: Medicare Other

## 2022-08-17 DIAGNOSIS — R1031 Right lower quadrant pain: Secondary | ICD-10-CM | POA: Diagnosis not present

## 2022-08-17 DIAGNOSIS — F0390 Unspecified dementia without behavioral disturbance: Secondary | ICD-10-CM | POA: Insufficient documentation

## 2022-08-17 DIAGNOSIS — Z7901 Long term (current) use of anticoagulants: Secondary | ICD-10-CM | POA: Insufficient documentation

## 2022-08-17 DIAGNOSIS — R1030 Lower abdominal pain, unspecified: Secondary | ICD-10-CM | POA: Diagnosis present

## 2022-08-17 DIAGNOSIS — K4090 Unilateral inguinal hernia, without obstruction or gangrene, not specified as recurrent: Secondary | ICD-10-CM | POA: Diagnosis not present

## 2022-08-17 DIAGNOSIS — Z7982 Long term (current) use of aspirin: Secondary | ICD-10-CM | POA: Diagnosis not present

## 2022-08-17 DIAGNOSIS — I509 Heart failure, unspecified: Secondary | ICD-10-CM | POA: Insufficient documentation

## 2022-08-17 DIAGNOSIS — J449 Chronic obstructive pulmonary disease, unspecified: Secondary | ICD-10-CM | POA: Insufficient documentation

## 2022-08-17 DIAGNOSIS — Z79899 Other long term (current) drug therapy: Secondary | ICD-10-CM | POA: Diagnosis not present

## 2022-08-17 DIAGNOSIS — I4891 Unspecified atrial fibrillation: Secondary | ICD-10-CM | POA: Insufficient documentation

## 2022-08-17 DIAGNOSIS — I110 Hypertensive heart disease with heart failure: Secondary | ICD-10-CM | POA: Insufficient documentation

## 2022-08-17 HISTORY — DX: Unspecified dementia, unspecified severity, without behavioral disturbance, psychotic disturbance, mood disturbance, and anxiety: F03.90

## 2022-08-17 LAB — COMPREHENSIVE METABOLIC PANEL
ALT: 14 U/L (ref 0–44)
AST: 19 U/L (ref 15–41)
Albumin: 4.1 g/dL (ref 3.5–5.0)
Alkaline Phosphatase: 64 U/L (ref 38–126)
Anion gap: 9 (ref 5–15)
BUN: 16 mg/dL (ref 8–23)
CO2: 25 mmol/L (ref 22–32)
Calcium: 9 mg/dL (ref 8.9–10.3)
Chloride: 99 mmol/L (ref 98–111)
Creatinine, Ser: 0.72 mg/dL (ref 0.61–1.24)
GFR, Estimated: >60 mL/min (ref >60)
Glucose, Bld: 92 mg/dL (ref 70–99)
Potassium: 3.9 mmol/L (ref 3.5–5.1)
Sodium: 133 mmol/L — ABNORMAL LOW (ref 135–145)
Total Bilirubin: 1.9 mg/dL — ABNORMAL HIGH (ref 0.3–1.2)
Total Protein: 6.9 g/dL (ref 6.5–8.1)

## 2022-08-17 LAB — CBC WITH DIFFERENTIAL/PLATELET
Abs Immature Granulocytes: 0.02 10*3/uL (ref 0.00–0.07)
Basophils Absolute: 0.1 10*3/uL (ref 0.0–0.1)
Basophils Relative: 1 %
Eosinophils Absolute: 0 10*3/uL (ref 0.0–0.5)
Eosinophils Relative: 1 %
HCT: 43.7 % (ref 39.0–52.0)
Hemoglobin: 14.5 g/dL (ref 13.0–17.0)
Immature Granulocytes: 0 %
Lymphocytes Relative: 19 %
Lymphs Abs: 1.3 10*3/uL (ref 0.7–4.0)
MCH: 32.6 pg (ref 26.0–34.0)
MCHC: 33.2 g/dL (ref 30.0–36.0)
MCV: 98.2 fL (ref 80.0–100.0)
Monocytes Absolute: 0.4 10*3/uL (ref 0.1–1.0)
Monocytes Relative: 6 %
Neutro Abs: 5.3 10*3/uL (ref 1.7–7.7)
Neutrophils Relative %: 73 %
Platelets: 193 10*3/uL (ref 150–400)
RBC: 4.45 MIL/uL (ref 4.22–5.81)
RDW: 13.1 % (ref 11.5–15.5)
WBC: 7.2 10*3/uL (ref 4.0–10.5)
nRBC: 0 % (ref 0.0–0.2)

## 2022-08-17 MED ORDER — HYDROCODONE-ACETAMINOPHEN 5-325 MG PO TABS: 1.0000 | ORAL_TABLET | Freq: Four times a day (QID) | ORAL | 0 refills | Status: DC | PRN

## 2022-08-17 MED ORDER — IOHEXOL 300 MG/ML  SOLN
100.0000 mL | Freq: Once | INTRAMUSCULAR | Status: AC | PRN
  Administered 2022-08-17: 100 mL via INTRAVENOUS

## 2022-08-17 MED ORDER — SODIUM CHLORIDE 0.9 % IV BOLUS
500.0000 mL | Freq: Once | INTRAVENOUS | Status: AC
  Administered 2022-08-17: 500 mL via INTRAVENOUS

## 2022-08-17 NOTE — ED Provider Notes (Signed)
[facility] DEPT Provider Note   CSN: [id number] Arrival date & time: 08/17/22  7564     History  Chief Complaint  Patient presents with   Groin Pain    Marcus Galloway is a 81 y.o. male with Hx of right-sided possible inguinal hernia, A-fib on Coumadin, mild dementia, s/p aortic valve replacement, hyperlipidemia, CHF, HTN, and COPD.  Presenting to the ED today with worsening RLQ/right groin pain.  On and off over the last 6 weeks, with significant worsening over the last 3 weeks, even more so this morning.  Pain rated 12/10 on arrival.  Patient has waited many hours for hours able to evaluate him.  Pain has mildly improved since then.  Patient also reports intermittent "masses" palpable, tender, and visible in the right testicle, however has denied their presence today.  Denies constipation, bloody bowel movements, diarrhea, urinary symptoms, nausea, vomiting, fevers.  The history is provided by the patient and medical records.  Groin Pain Associated symptoms include abdominal pain.     Home Medications Prior to Admission medications   Medication Sig Start Date End Date Taking? Authorizing Provider  HYDROcodone-acetaminophen (NORCO/VICODIN) 5-325 MG tablet Take 1 tablet by mouth every 6 (six) hours as needed for severe pain. 08/17/22  Yes Prince Rome, PA-C  aspirin EC 81 MG tablet Take 1 tablet (81 mg total) by mouth daily. Swallow whole. 04/24/22   Troy Sine, MD  atorvastatin (LIPITOR) 10 MG tablet TAKE 1 TABLET BY MOUTH DAILY 08/15/22   Isaac Bliss, Rayford Halsted, MD  Cyanocobalamin (B-12 COMPLIANCE INJECTION IJ) Inject as directed.    [provider]  donepezil (ARICEPT) 5 MG tablet Take 1 tablet (5 mg total) by mouth at bedtime. 03/22/22   Pieter Partridge, DO  memantine (NAMENDA) 10 MG tablet ONE TABLET BY MOUTH TWICE A DAY 02/26/22   Tomi Likens, Adam R, DO  metoprolol tartrate (LOPRESSOR) 50 MG tablet TAKE ONE TABLET BY MOUTH TWICE A DAY  07/03/22   Isaac Bliss, Rayford Halsted, MD  OMEPRAZOLE PO Take 20 mg by mouth daily.    [provider]  ondansetron (ZOFRAN) 4 MG tablet Take 1 tablet (4 mg total) by mouth every 8 (eight) hours as needed for nausea or vomiting. 08/10/22   Nafziger, Tommi Rumps, NP  sertraline (ZOLOFT) 25 MG tablet TAKE ONE TABLET BY MOUTH DAILY 07/10/22   Isaac Bliss, Rayford Halsted, MD  tamsulosin (FLOMAX) 0.4 MG CAPS capsule TAKE ONE CAPSULE BY MOUTH DAILY 05/21/22   Isaac Bliss, Rayford Halsted, MD      Allergies    Patient has no known allergies.    Review of Systems   Review of Systems  Gastrointestinal:  Positive for abdominal pain.  Genitourinary:  Positive for scrotal swelling (Intermittent).    Physical Exam Updated Vital Signs BP 107/62   Pulse 82   Temp 97.8 F (36.6 C) (Oral)   Resp 15   Ht '5\' 10"'$  (1.778 m)   Wt 74.8 kg   SpO2 97%   BMI 23.68 kg/m  Physical Exam Vitals and nursing note reviewed. Exam conducted with a chaperone present.  Constitutional:      General: He is not in acute distress.    Appearance: He is well-developed. He is not ill-appearing, toxic-appearing or diaphoretic.  HENT:     Head: Normocephalic and atraumatic.     Mouth/Throat:     Pharynx: Oropharynx is clear.  Eyes:     General: No scleral icterus.    Conjunctiva/sclera:  Conjunctivae normal.  Cardiovascular:     Rate and Rhythm: Normal rate and regular rhythm.     Heart sounds: No murmur heard. Pulmonary:     Effort: Pulmonary effort is normal. No respiratory distress.     Breath sounds: Normal breath sounds.  Abdominal:     General: There is no distension.     Palpations: Abdomen is soft.     Tenderness: There is abdominal tenderness. There is no right CVA tenderness, left CVA tenderness, guarding or rebound.     Hernia: A hernia is present.  Genitourinary:    Penis: Normal.      Testes: Normal.     Comments: Cremaster reflex normal.  Mild decreased size of the right testicle.  No significant  change with Valsalva.  Significant tenderness near the inguinal canal entrance and lower RLQ area without obvious mass.  No bony tenderness.  Mild gas bubbles heard and palpated with pressure in RLQ area. Musculoskeletal:        General: No swelling.     Cervical back: Neck supple. No rigidity.  Skin:    General: Skin is warm and dry.     Capillary Refill: Capillary refill takes less than 2 seconds.     Coloration: Skin is not jaundiced or pale.     Findings: No bruising, erythema or rash.  Neurological:     Mental Status: He is alert and oriented to person, place, and time.  Psychiatric:        Mood and Affect: Mood normal.     ED Results / Procedures / Treatments   Labs (all labs ordered are listed, but only abnormal results are displayed) Labs Reviewed  COMPREHENSIVE METABOLIC PANEL - Abnormal; Notable for the following components:      Result Value   Sodium 133 (*)    Total Bilirubin 1.9 (*)    All other components within normal limits  CBC WITH DIFFERENTIAL/PLATELET    EKG None  Radiology CT Abdomen Pelvis W Contrast  Result Date: 08/17/2022 CLINICAL DATA:  Bowel obstruction suspected. Possible hernia. Right lower quadrant pain. EXAM: CT ABDOMEN AND PELVIS WITH CONTRAST TECHNIQUE: Multidetector CT imaging of the abdomen and pelvis was performed using the standard protocol following bolus administration of intravenous contrast. RADIATION DOSE REDUCTION: This exam was performed according to the departmental dose-optimization program which includes automated exposure control, adjustment of the mA and/or kV according to patient size and/or use of iterative reconstruction technique. CONTRAST:  156m OMNIPAQUE IOHEXOL 300 MG/ML  SOLN COMPARISON:  None Available. FINDINGS: Lower chest: Lung bases are clear.  Small esophageal hiatal hernia. Hepatobiliary: Scattered subcentimeter low-attenuation lesions in the liver are too small to characterize but likely represent small cysts or  hemangiomas. Gallbladder and bile ducts are unremarkable. Pancreas: Unremarkable. No pancreatic ductal dilatation or surrounding inflammatory changes. Spleen: Normal in size without focal abnormality. Adrenals/Urinary Tract: No adrenal gland nodules. Nephrograms are symmetrical. No hydronephrosis or hydroureter. Scattered bilateral subcentimeter low-attenuation lesions likely representing small cysts. No imaging follow-up is indicated. Bladder is normal. Stomach/Bowel: Stomach, small bowel, and colon are not abnormally distended. No wall thickening or inflammatory changes. Diverticulosis of the colon without evidence of acute diverticulitis. The appendix is normal. Vascular/Lymphatic: Aortic atherosclerosis. No enlarged abdominal or pelvic lymph nodes. 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 Other: No free air or free fluid in the abdomen. No discrete hernia is demonstrated at present but there is soft tissue thickening in the right inguinal canal. This  could indicate inflammatory reaction or postoperative change. No loculated collections. Musculoskeletal: Degenerative changes.  No acute bony abnormalities. IMPRESSION: 1. No acute process demonstrated in the abdomen or pelvis. No evidence of bowel obstruction or inflammation. 2. No discrete hernia is demonstrated but there is soft tissue thickening in the right inguinal canal which could indicate inflammatory reaction or postoperative change. This could be related to an intermittent reducible hernia. Electronically Signed   By: Lucienne Capers M.D.   On: 08/17/2022 19:49    Procedures Procedures    Medications Ordered in ED Medications  sodium chloride 0.9 % bolus 500 mL (0 mLs Intravenous Stopped 08/17/22 2000)  iohexol (OMNIPAQUE) 300 MG/ML solution 100 mL (100 mLs Intravenous Contrast Given 08/17/22 1916)    ED Course/ Medical Decision Making/ A&P Clinical Course as of 08/19/22 0000  Fri Aug 17, 2022   1836 Total Bilirubin(!): 1.9 [AC]  2014 Consulted with Dr. Marlou Starks, discussed patient case in detail.  Recommended outpatient prescription for TRUS to put pressure on the area and hopefully reduce ability of hernia protrusion.  Also recommended rest.  Plans to discuss with scheduling staff first thing Monday to get patient surgery moved up in the next 1 to 2 weeks.  Also recommends pain management over the next couple days until able to hear back from general surgery. [AC]    Clinical Course User Index [AC] Prince Rome, PA-C                           Medical Decision Making Amount and/or Complexity of Data Reviewed Labs: ordered. Decision-making details documented in ED Course. Radiology: ordered.  Risk Prescription drug management.   81 y.o. male presents to the ED for concern of Groin Pain     This involves an extensive number of treatment options, and is a complaint that carries with it a high risk of complications and morbidity.   Past Medical History / Co-morbidities / Social History: Hx of right-sided possible inguinal hernia, A-fib on Coumadin, mild dementia, s/p aortic valve replacement, hyperlipidemia, CHF, HTN, and COPD Social Determinants of Health include: Elderly  Additional History:  Obtained by chart review.  Notably initial symptomology began 06/29/2021, was evaluated by PCP, recommended to use Tylenol for pain and for likely surgical intervention down the road.  Lab Tests: I ordered, and personally interpreted labs.  The pertinent results include:   Total bili of 1.9 Very mild hyponatremia 133 WBC 7.2 without leukocytosis  Imaging Studies: I ordered imaging studies including CT abdomen and pelvis.   I independently visualized and interpreted imaging which showed no acute process demonstrated in the abdomen or pelvis, and no evidence of bowel obstruction or inflammation. No discrete hernia visualized but there is soft tissue thickening in the right inguinal  canal which could indicate inflammatory reaction or postoperative change. This could be related to an intermittent reducible hernia I agree with the radiologist interpretation.  ED Course / Critical Interventions: Pt well-appearing on exam.  Presenting with lower RLQ tenderness and worsening pain of likely right inguinal hernia.  Pain increasing over the last 6 weeks, significantly over the last 3 weeks, and worst this morning.  Pain today 12/10, though has improved while waiting to be evaluated in the ED for many hours.  Patient is nontoxic, nonseptic appearing, in no apparent distress.  No obvious mass appreciated on exam, however significant tenderness near the inguinal canal.  No change in the testes with Valsalva.  Also  mild gas bubbles heard and palpated when pressing in the RLQ.  Patient's pain and other symptoms adequately managed in ED.  Fluid bolus given.  Labs, history and vitals reviewed.  Without changes in urinary or bowel habits.  Afebrile.  WBC not elevated, no leukocytosis.  Patient does not meet the SIRS or Sepsis criteria.  Plan for CT abdomen/pelvis and reassess.   On repeat exam patient does not have a surgical abdomen and there are no peritoneal signs.  Mild-moderate improvement appreciated.  Imaging without indication of appendicitis, bowel obstruction, bowel perforation, renal calculi, cholecystitis, pancreatitis, pyelonephritis, diverticulitis, thought does suggest likely intermittently reducible right inguinal hernia.  This correlates clinically.  No evidence of inguinal hernia protrusion on exam.  May have been reduced on exam prior to imaging occurring.   Consulted with Dr. Marlou Starks as above.  Plan for pt to be contacted by Dr. Ethlyn Gallery office within the next few days, plan to move up surgery date.  Strict return precautions discussed.  Continued conservative symptomatic management discussed and short course of narcotics sent to pharmacy for break through pain.  Recommended cessation of  Tramadol.  Also provided pt with written prescription for right sided Truss to fill at local DME supplier.  Resources provided.  Patient satisfied with today's encounter.  Pt in NAD and good condition at time of discharge.  Received verbal permission to discuss information with pt's daughters present in room, and informed them of plan/treatment course.  I have reviewed the patients home medicines and have made adjustments as needed.  Disposition: Considered admission and after reviewing the patient's encounter today, I feel that the patient would benefit from close outpatient contact with general surgery and conservative management as described above at this time.  Discussed course of treatment with the patient, whom demonstrated understanding.  Encouraged to return to ED at any time for new or worsening symptoms.  Patient in agreement and has no further questions.    I discussed this case with my attending, Dr. Mayra Neer, who agreed with the proposed treatment course and cosigned this note including patient's presenting symptoms, physical exam, and planned diagnostics and interventions.  Attending physician stated agreement with plan or made changes to plan which were implemented.     This chart was dictated using voice recognition software.  Despite best efforts to proofread, errors can occur which can change the documentation meaning.         Final Clinical Impression(s) / ED Diagnoses Final diagnoses:  Right inguinal hernia  Right groin pain    Rx / DC Orders ED Discharge Orders          Ordered    HYDROcodone-acetaminophen (NORCO/VICODIN) 5-325 MG tablet  Every 6 hours PRN        08/17/22 2126              Prince Rome, PA-C 54/65/03 0006    Audley Hose, MD 08/27/22 [phone]

## 2022-08-17 NOTE — Discharge Instructions (Signed)
Imaging today was reassuring, did not show any signs of herniation with complication.  As discussed with Dr. Marlou Starks, please continue to rest for the next few days until you are able to hear back Monday or Tuesday from their office.  Continue to stay hydrated and manage pain with Tylenol, and a few tablets of Vicodin has been provided for breakthrough pain.  Return to the ED for any new or worsening symptoms as discussed.  A prescription for a DME item called a truss has been provided for you.  Please fill this at any local DME supply store to help put pressure on the area of the intermittent herniation.

## 2022-08-17 NOTE — ED Provider Triage Note (Signed)
Emergency Medicine Provider OB Triage Evaluation Note  Marcus Galloway is a 81 y.o. male, No obstetric history on file., at Unknown gestation who presents to the emergency department with complaints of right inguinal pain from inguinal hernia.  Review of  Systems  Positive: Inguinal pain   Physical Exam  BP 96/70   Pulse 69   Temp 97.7 F (36.5 C) (Oral)   Resp 18   Ht '5\' 10"'$  (1.778 m)   Wt 74.8 kg   SpO2 94%   BMI 23.68 kg/m  General: Awake, no distress  HEENT: Atraumatic  Resp: Normal effort  Cardiac: Normal rate Abd: Nondistended, nontender  MSK: Moves all extremities without difficulty Neuro: Speech clear  Medical Decision Making  Pt evaluated for pregnancy concern and is stable for transfer to [facility]. Pt is in agreement with plan for transfer. Labs ordered  Clinical Impression  No diagnosis found.     Milton Ferguson, MD 08/17/22 1059

## 2022-08-17 NOTE — ED Triage Notes (Signed)
Patient reports that he has a right groin hernia and states that it began hurting more than normal this AM at 0500.

## 2022-08-19 NOTE — ED Provider Notes (Incomplete)
[facility] DEPT Provider Note   CSN: [id number] Arrival date & time: 08/17/22  8299     History {Add pertinent medical, surgical, social history, OB history to HPI:1} Chief Complaint  Patient presents with  . Groin Pain    Marcus Galloway is a 81 y.o. male with Hx of right-sided possible inguinal hernia, A-fib on Coumadin, mild dementia, s/p aortic valve replacement, hyperlipidemia, CHF, HTN, and COPD.  Presenting to the ED today with worsening RLQ/right groin pain.  On and off over the last 6 weeks, with significant worsening over the last 3 weeks, even more so this morning.  Pain rated 12/10 on arrival.  Patient has waited many hours for hours able to evaluate him.  Pain has mildly improved since then.  Patient also reports intermittent "masses" palpable, tender, and visible in the right testicle, however has denied their presence today.  Denies constipation, bloody bowel movements, diarrhea, urinary symptoms, nausea, vomiting, fevers.  The history is provided by the patient and medical records.  Groin Pain Associated symptoms include abdominal pain.     Home Medications Prior to Admission medications   Medication Sig Start Date End Date Taking? Authorizing Provider  HYDROcodone-acetaminophen (NORCO/VICODIN) 5-325 MG tablet Take 1 tablet by mouth every 6 (six) hours as needed for severe pain. 08/17/22  Yes Prince Rome, PA-C  aspirin EC 81 MG tablet Take 1 tablet (81 mg total) by mouth daily. Swallow whole. 04/24/22   Troy Sine, MD  atorvastatin (LIPITOR) 10 MG tablet TAKE 1 TABLET BY MOUTH DAILY 08/15/22   Isaac Bliss, Rayford Halsted, MD  Cyanocobalamin (B-12 COMPLIANCE INJECTION IJ) Inject as directed.    [provider]  donepezil (ARICEPT) 5 MG tablet Take 1 tablet (5 mg total) by mouth at bedtime. 03/22/22   Pieter Partridge, DO  memantine (NAMENDA) 10 MG tablet ONE TABLET BY MOUTH TWICE A DAY 02/26/22   Tomi Likens, Adam R, DO  metoprolol  tartrate (LOPRESSOR) 50 MG tablet TAKE ONE TABLET BY MOUTH TWICE A DAY 07/03/22   Isaac Bliss, Rayford Halsted, MD  OMEPRAZOLE PO Take 20 mg by mouth daily.    [provider]  ondansetron (ZOFRAN) 4 MG tablet Take 1 tablet (4 mg total) by mouth every 8 (eight) hours as needed for nausea or vomiting. 08/10/22   Nafziger, Tommi Rumps, NP  sertraline (ZOLOFT) 25 MG tablet TAKE ONE TABLET BY MOUTH DAILY 07/10/22   Isaac Bliss, Rayford Halsted, MD  tamsulosin (FLOMAX) 0.4 MG CAPS capsule TAKE ONE CAPSULE BY MOUTH DAILY 05/21/22   Isaac Bliss, Rayford Halsted, MD      Allergies    Patient has no known allergies.    Review of Systems   Review of Systems  Gastrointestinal:  Positive for abdominal pain.  Genitourinary:  Positive for scrotal swelling (Intermittent).    Physical Exam Updated Vital Signs BP 107/62   Pulse 82   Temp 97.8 F (36.6 C) (Oral)   Resp 15   Ht '5\' 10"'$  (1.778 m)   Wt 74.8 kg   SpO2 97%   BMI 23.68 kg/m  Physical Exam Vitals and nursing note reviewed. Exam conducted with a chaperone present.  Constitutional:      General: He is not in acute distress.    Appearance: He is well-developed. He is not ill-appearing, toxic-appearing or diaphoretic.  HENT:     Head: Normocephalic and atraumatic.     Mouth/Throat:     Pharynx: Oropharynx is clear.  Eyes:  General: No scleral icterus.    Conjunctiva/sclera: Conjunctivae normal.  Cardiovascular:     Rate and Rhythm: Normal rate and regular rhythm.     Heart sounds: No murmur heard. Pulmonary:     Effort: Pulmonary effort is normal. No respiratory distress.     Breath sounds: Normal breath sounds.  Abdominal:     General: There is no distension.     Palpations: Abdomen is soft.     Tenderness: There is abdominal tenderness. There is no right CVA tenderness, left CVA tenderness, guarding or rebound.     Hernia: A hernia is present.  Genitourinary:    Penis: Normal.      Testes: Normal.     Comments: Cremaster reflex  normal.  Mild decreased size of the right testicle.  No significant change with Valsalva.  Significant tenderness near the inguinal canal entrance and lower RLQ area without obvious mass.  No bony tenderness.  Mild gas bubbles heard and palpated with pressure in RLQ area. Musculoskeletal:        General: No swelling.     Cervical back: Neck supple. No rigidity.  Skin:    General: Skin is warm and dry.     Capillary Refill: Capillary refill takes less than 2 seconds.     Coloration: Skin is not jaundiced or pale.     Findings: No bruising, erythema or rash.  Neurological:     Mental Status: He is alert and oriented to person, place, and time.  Psychiatric:        Mood and Affect: Mood normal.     ED Results / Procedures / Treatments   Labs (all labs ordered are listed, but only abnormal results are displayed) Labs Reviewed  COMPREHENSIVE METABOLIC PANEL - Abnormal; Notable for the following components:      Result Value   Sodium 133 (*)    Total Bilirubin 1.9 (*)    All other components within normal limits  CBC WITH DIFFERENTIAL/PLATELET    EKG None  Radiology CT Abdomen Pelvis W Contrast  Result Date: 08/17/2022 CLINICAL DATA:  Bowel obstruction suspected. Possible hernia. Right lower quadrant pain. EXAM: CT ABDOMEN AND PELVIS WITH CONTRAST TECHNIQUE: Multidetector CT imaging of the abdomen and pelvis was performed using the standard protocol following bolus administration of intravenous contrast. RADIATION DOSE REDUCTION: This exam was performed according to the departmental dose-optimization program which includes automated exposure control, adjustment of the mA and/or kV according to patient size and/or use of iterative reconstruction technique. CONTRAST:  175m OMNIPAQUE IOHEXOL 300 MG/ML  SOLN COMPARISON:  None Available. FINDINGS: Lower chest: Lung bases are clear.  Small esophageal hiatal hernia. Hepatobiliary: Scattered subcentimeter low-attenuation lesions in the liver  are too small to characterize but likely represent small cysts or hemangiomas. Gallbladder and bile ducts are unremarkable. Pancreas: Unremarkable. No pancreatic ductal dilatation or surrounding inflammatory changes. Spleen: Normal in size without focal abnormality. Adrenals/Urinary Tract: No adrenal gland nodules. Nephrograms are symmetrical. No hydronephrosis or hydroureter. Scattered bilateral subcentimeter low-attenuation lesions likely representing small cysts. No imaging follow-up is indicated. Bladder is normal. Stomach/Bowel: Stomach, small bowel, and colon are not abnormally distended. No wall thickening or inflammatory changes. Diverticulosis of the colon without evidence of acute diverticulitis. The appendix is normal. Vascular/Lymphatic: Aortic atherosclerosis. No enlarged abdominal or pelvic lymph nodes. 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 Other: No free air or free fluid in the abdomen. No discrete hernia is demonstrated at present but there is soft  tissue thickening in the right inguinal canal. This could indicate inflammatory reaction or postoperative change. No loculated collections. Musculoskeletal: Degenerative changes.  No acute bony abnormalities. IMPRESSION: 1. No acute process demonstrated in the abdomen or pelvis. No evidence of bowel obstruction or inflammation. 2. No discrete hernia is demonstrated but there is soft tissue thickening in the right inguinal canal which could indicate inflammatory reaction or postoperative change. This could be related to an intermittent reducible hernia. Electronically Signed   By: Lucienne Capers M.D.   On: 08/17/2022 19:49    Procedures Procedures  {Document cardiac monitor, telemetry assessment procedure when appropriate:1}  Medications Ordered in ED Medications  sodium chloride 0.9 % bolus 500 mL (0 mLs Intravenous Stopped 08/17/22 2000)  iohexol (OMNIPAQUE) 300 MG/ML solution 100 mL (100 mLs  Intravenous Contrast Given 08/17/22 1916)    ED Course/ Medical Decision Making/ A&P Clinical Course as of 08/19/22 0000  Fri Aug 17, 2022  1836 Total Bilirubin(!): 1.9 [AC]  2014 Consulted with Dr. Marlou Starks, discussed patient case in detail.  Recommended outpatient prescription for TRUS to put pressure on the area and hopefully reduce ability of hernia protrusion.  Also recommended rest.  Plans to discuss with scheduling staff first thing Monday to get patient surgery moved up in the next 1 to 2 weeks.  Also recommends pain management over the next couple days until able to hear back from general surgery. [AC]    Clinical Course User Index [AC] Prince Rome, PA-C                           Medical Decision Making Amount and/or Complexity of Data Reviewed Labs: ordered. Decision-making details documented in ED Course. Radiology: ordered.  Risk Prescription drug management.   81 y.o. male presents to the ED for concern of Groin Pain     This involves an extensive number of treatment options, and is a complaint that carries with it a high risk of complications and morbidity.   Past Medical History / Co-morbidities / Social History: Hx of right-sided possible inguinal hernia, A-fib on Coumadin, mild dementia, s/p aortic valve replacement, hyperlipidemia, CHF, HTN, and COPD Social Determinants of Health include: Elderly  Additional History:  Obtained by chart review.  Notably initial symptomology began 06/29/2021, was evaluated by PCP, recommended to use Tylenol for pain and for likely surgical intervention down the road.  Lab Tests: I ordered, and personally interpreted labs.  The pertinent results include:   Total bili of 1.9 Very mild hyponatremia 133 WBC 7.2 without leukocytosis  Imaging Studies: I ordered imaging studies including CT abdomen and pelvis.   I independently visualized and interpreted imaging which showed no acute process demonstrated in the abdomen or pelvis,  and no evidence of bowel obstruction or inflammation. No discrete hernia visualized but there is soft tissue thickening in the right inguinal canal which could indicate inflammatory reaction or postoperative change. This could be related to an intermittent reducible hernia I agree with the radiologist interpretation.  ED Course / Critical Interventions: Pt well-appearing on exam.  Presenting with lower RLQ tenderness and worsening pain of likely right inguinal hernia.  Pain increasing over the last 6 weeks, significantly over the last 3 weeks, and worst this morning.  Pain today 12/10, though has improved while waiting to be evaluated in the ED for many hours.  Patient is nontoxic, nonseptic appearing, in no apparent distress.  No obvious mass appreciated on exam, however significant  tenderness near the inguinal canal.  No change in the testes with Valsalva.  Also mild gas bubbles heard and palpated when pressing in the RLQ.  Patient's pain and other symptoms adequately managed in ED.  Fluid bolus given.  Labs, history and vitals reviewed.  Without changes in urinary or bowel habits.  Afebrile.  WBC not elevated, no leukocytosis.  Patient does not meet the SIRS or Sepsis criteria.  Plan for CT abdomen/pelvis and reassess.   On repeat exam patient does not have a surgical abdomen and there are no peritoneal signs.  Mild-moderate improvement appreciated.  Imaging without indication of appendicitis, bowel obstruction, bowel perforation, renal calculi, cholecystitis, pancreatitis, pyelonephritis, diverticulitis, thought does suggest likely intermittently reducible right inguinal hernia.  Consulted with Dr. Marlou Starks as above.  Plan for pt to be contacted by Dr. Ethlyn Gallery office within the next few days, plan to move up surgery date.  Strict return precautions discussed.  Continued conservative symptomatic management discussed and short course of narcotics sent to pharmacy for break through pain.  Recommended cessation of  Tramadol.  Patient satisfied with today's encounter.  Pt in NAD and good condition at time of discharge.  Received verbal permission to discuss information with pt's daughters present in room, and informed them of plan/treatment course.  I have reviewed the patients home medicines and have made adjustments as needed.  Disposition: Considered admission and after reviewing the patient's encounter today, I feel that the patient would benefit from close outpatient contact with general surgery and conservative management as described above at this time.  Discussed course of treatment with the patient, whom demonstrated understanding.  Encouraged to retuPatient in agreement and has no further questions.    I discussed this case with my attending, Dr. Mayra Neer, who agreed with the proposed treatment course and cosigned this note including patient's presenting symptoms, physical exam, and planned diagnostics and interventions.  Attending physician stated agreement with plan or made changes to plan which were implemented.     This chart was dictated using voice recognition software.  Despite best efforts to proofread, errors can occur which can change the documentation meaning.   {Document critical care time when appropriate:1} {Document review of labs and clinical decision tools ie heart score, Chads2Vasc2 etc:1}  {Document your independent review of radiology images, and any outside records:1} {Document your discussion with family members, caretakers, and with consultants:1} {Document social determinants of health affecting pt's care:1} {Document your decision making why or why not admission, treatments were needed:1} Final Clinical Impression(s) / ED Diagnoses Final diagnoses:  Right inguinal hernia  Right groin pain    Rx / DC Orders ED Discharge Orders          Ordered    HYDROcodone-acetaminophen (NORCO/VICODIN) 5-325 MG tablet  Every 6 hours PRN        08/17/22 2126

## 2022-08-22 ENCOUNTER — Telehealth: Payer: Self-pay | Admitting: Licensed Clinical Social Worker

## 2022-08-23 NOTE — Progress Notes (Signed)
Surgical Instructions    Your procedure is scheduled on Monday October 30th.  Report to [facility] Main Entrance "A" at 1215 P.M., then check in with the Admitting office.  Call this number if you have problems the morning of surgery:  [phone]   If you have any questions prior to your surgery date call [phone]: Open Monday-Friday 8am-4pm If you experience any cold or flu symptoms such as cough, fever, chills, shortness of breath, etc. between now and your scheduled surgery, please notify us at the above number     Remember:  Do not eat after midnight the night before your surgery  You may drink clear liquids until 11:15am the morning of your surgery.   Clear liquids allowed are: Water, Non-Citrus Juices (without pulp), Carbonated Beverages, Clear Tea, Black Coffee ONLY (NO MILK, CREAM OR POWDERED CREAMER of any kind), and Gatorade    Take these medicines the morning of surgery with A SIP OF WATER: atorvastatin (LIPITOR) 10 MG tablet memantine (NAMENDA) 10 MG tablet metoprolol tartrate (LOPRESSOR) 50 MG tablet OMEPRAZOLE PO sertraline (ZOLOFT) 25 MG tablet tamsulosin (FLOMAX) 0.4 MG CAPS capsule  IF NEEDED  HYDROcodone-acetaminophen (NORCO/VICODIN) 5-325 MG tablet ondansetron (ZOFRAN) 4 MG tablet  Follow your surgeon's instructions on when to stop Aspirin.  If no instructions were given by your surgeon then you will need to call the office to get those instructions.     As of today, STOP taking any Aspirin (unless otherwise instructed by your surgeon) Aleve, Naproxen, Ibuprofen, Motrin, Advil, Goody's, BC's, all herbal medications, fish oil, and all vitamins.           Do not wear jewelry  Do not wear lotions, powders, cologne or deodorant. Do not shave 48 hours prior to surgery.  Men may shave face and neck. Do not bring valuables to the hospital. Do not wear nail polish  [facility] is not responsible for any belongings or valuables.    Do NOT Smoke  (Tobacco/Vaping)  24 hours prior to your procedure  If you use a CPAP at night, you may bring your mask for your overnight stay.   Contacts, glasses, hearing aids, dentures or partials may not be worn into surgery, please bring cases for these belongings   For patients admitted to the hospital, discharge time will be determined by your treatment team.   Patients discharged the day of surgery will not be allowed to drive home, and someone needs to stay with them for 24 hours.   SURGICAL WAITING ROOM VISITATION Patients having surgery or a procedure may have no more than 2 support people in the waiting area - these visitors may rotate.   Children under the age of 2 must have an adult with them who is not the patient. If the patient needs to stay at the hospital during part of their recovery, the visitor guidelines for inpatient rooms apply. Pre-op nurse will coordinate an appropriate time for 1 support person to accompany patient in pre-op.  This support person may not rotate.   Please refer to [email or web address] for the visitor guidelines for Inpatients (after your surgery is over and you are in a regular room).    Special instructions:    Oral Hygiene is also important to reduce your risk of infection.  Remember - BRUSH YOUR TEETH THE MORNING OF SURGERY WITH YOUR REGULAR TOOTHPASTE   [facility]- Preparing For Surgery  Before surgery, you can play an important role. Because skin is not sterile, your skin needs  to be as free of germs as possible. You can reduce the number of germs on your skin by washing with CHG (chlorahexidine gluconate) Soap before surgery.  CHG is an antiseptic cleaner which kills germs and bonds with the skin to continue killing germs even after washing.     Please do not use if you have an allergy to CHG or antibacterial soaps. If your skin becomes reddened/irritated stop using the CHG.  Do not shave (including  legs and underarms) for at least 48 hours prior to first CHG shower. It is OK to shave your face.  Please follow these instructions carefully.     Shower the NIGHT BEFORE SURGERY and the MORNING OF SURGERY with CHG Soap.   If you chose to wash your hair, wash your hair first as usual with your normal shampoo. After you shampoo, rinse your hair and body thoroughly to remove the shampoo.  Then [employer] and genitals (private parts) with your normal soap and rinse thoroughly to remove soap.  After that Use CHG Soap as you would any other liquid soap. You can apply CHG directly to the skin and wash gently with a scrungie or a clean washcloth.   Apply the CHG Soap to your body ONLY FROM THE NECK DOWN.  Do not use on open wounds or open sores. Avoid contact with your eyes, ears, mouth and genitals (private parts). Wash Face and genitals (private parts)  with your normal soap.   Wash thoroughly, paying special attention to the area where your surgery will be performed.  Thoroughly rinse your body with warm water from the neck down.  DO NOT shower/wash with your normal soap after using and rinsing off the CHG Soap.  Pat yourself dry with a CLEAN TOWEL.  Wear CLEAN PAJAMAS to bed the night before surgery  Place CLEAN SHEETS on your bed the night before your surgery  DO NOT SLEEP WITH PETS.   Day of Surgery:  Take a shower with CHG soap. Wear Clean/Comfortable clothing the morning of surgery Do not apply any deodorants/lotions.   Remember to brush your teeth WITH YOUR REGULAR TOOTHPASTE.    If you received a COVID test during your pre-op visit, it is requested that you wear a mask when out in public, stay away from anyone that may not be feeling well, and notify your surgeon if you develop symptoms. If you have been in contact with anyone that has tested positive in the last 10 days, please notify your surgeon.    Please read over the following fact sheets that you were given.

## 2022-08-24 ENCOUNTER — Encounter (HOSPITAL_COMMUNITY): Payer: Self-pay

## 2022-08-24 ENCOUNTER — Other Ambulatory Visit: Payer: Self-pay

## 2022-08-24 ENCOUNTER — Encounter (HOSPITAL_COMMUNITY)
Admission: RE | Admit: 2022-08-24 | Discharge: 2022-08-24 | Disposition: A | Discharge status: Home / Self Care | Payer: Medicare Other | Source: Ambulatory Visit | Attending: General Surgery | Admitting: General Surgery

## 2022-08-24 DIAGNOSIS — Z953 Presence of xenogenic heart valve: Secondary | ICD-10-CM | POA: Insufficient documentation

## 2022-08-24 DIAGNOSIS — F0390 Unspecified dementia without behavioral disturbance: Secondary | ICD-10-CM | POA: Insufficient documentation

## 2022-08-24 DIAGNOSIS — Z8546 Personal history of malignant neoplasm of prostate: Secondary | ICD-10-CM | POA: Insufficient documentation

## 2022-08-24 DIAGNOSIS — Z01818 Encounter for other preprocedural examination: Secondary | ICD-10-CM | POA: Insufficient documentation

## 2022-08-24 DIAGNOSIS — K4090 Unilateral inguinal hernia, without obstruction or gangrene, not specified as recurrent: Secondary | ICD-10-CM | POA: Insufficient documentation

## 2022-08-24 DIAGNOSIS — Z7982 Long term (current) use of aspirin: Secondary | ICD-10-CM | POA: Insufficient documentation

## 2022-08-24 DIAGNOSIS — Z87891 Personal history of nicotine dependence: Secondary | ICD-10-CM | POA: Insufficient documentation

## 2022-08-24 DIAGNOSIS — N400 Enlarged prostate without lower urinary tract symptoms: Secondary | ICD-10-CM | POA: Insufficient documentation

## 2022-08-24 DIAGNOSIS — I4821 Permanent atrial fibrillation: Secondary | ICD-10-CM | POA: Insufficient documentation

## 2022-08-24 DIAGNOSIS — I10 Essential (primary) hypertension: Secondary | ICD-10-CM | POA: Insufficient documentation

## 2022-08-24 DIAGNOSIS — E785 Hyperlipidemia, unspecified: Secondary | ICD-10-CM | POA: Insufficient documentation

## 2022-08-24 HISTORY — DX: Personal history of other specified conditions: Z87.898

## 2022-08-24 HISTORY — DX: Other complications of anesthesia, initial encounter: T88.59XA

## 2022-08-24 NOTE — Anesthesia Preprocedure Evaluation (Signed)
Anesthesia Evaluation  Patient identified by MRN, date of birth, ID band Patient awake    Reviewed: Allergy & Precautions, H&P , NPO status , Patient's Chart, lab work & pertinent test results  Airway Mallampati: II  TM Distance: >3 FB Neck ROM: Full    Dental  (+) Dental Advisory Given, Chipped, Poor Dentition   Pulmonary COPD, former smoker,    Pulmonary exam normal breath sounds clear to auscultation       Cardiovascular hypertension, Pt. on medications and Pt. on home beta blockers +CHF  Normal cardiovascular exam+ dysrhythmias Atrial Fibrillation  Rhythm:Regular Rate:Normal  S/P AVR with bioprosthetic valve   Echo 2021 1. Bioprosthetic valve with AT< 100, DVI 43. Aortic valve regurgitation is not visualized. There is a 25 mm bioprosthetic valve present in the aortic position. Procedure Date: December 2013.  2. Left ventricular ejection fraction, by estimation, is 55 to 60%. The left ventricle has normal function. The left ventricle has no regional wall motion abnormalities. There is moderate left ventricular hypertrophy. Left ventricular diastolic parameters are indeterminate.  3. Right ventricular systolic function is normal. The right ventricular size is normal. There is normal pulmonary artery systolic pressure.  4. Left atrial size was severely dilated.  5. Right atrial size was severely dilated.  6. The mitral valve is grossly normal. Trivial mitral valve  regurgitation.   Comparison(s): No significant change from prior study.    Neuro/Psych PSYCHIATRIC DISORDERS Dementia negative neurological ROS     GI/Hepatic GERD  ,(+)     substance abuse  alcohol use,   Endo/Other  negative endocrine ROS    Renal/GU negative Renal ROS     Musculoskeletal  (+) Arthritis ,   Abdominal   Peds  Hematology negative hematology ROS (+)   Anesthesia Other Findings   Reproductive/Obstetrics negative OB ROS                           Anesthesia Physical Anesthesia Plan  ASA: 3  Anesthesia Plan: General   Post-op Pain Management: Tylenol PO (pre-op)* and Gabapentin PO (pre-op)*   Induction: Intravenous  PONV Risk Score and Plan: 2 and Ondansetron, Dexamethasone and Treatment may vary due to age or medical condition  Airway Management Planned: Oral ETT  Additional Equipment:   Intra-op Plan:   Post-operative Plan: Extubation in OR  Informed Consent: I have reviewed the patients History and Physical, chart, labs and discussed the procedure including the risks, benefits and alternatives for the proposed anesthesia with the patient or authorized representative who has indicated his/her understanding and acceptance.     Dental advisory given  Plan Discussed with: CRNA and Surgeon  Anesthesia Plan Comments: (PAT note written 08/24/2022 by Myra Gianotti, PA-C. History of bioprosthetic AVR, afib, dementia, heavy alcohol use.   )      Anesthesia Quick Evaluation

## 2022-08-24 NOTE — Progress Notes (Signed)
Anesthesia Chart Review:  Case: [id number] Date/Time: 08/27/22 1400   Procedure: OPEN RIGHT INGUINAL HERNIA REPAIR WITH MESH (Right)   Anesthesia type: General   Pre-op diagnosis: RIGHT INGUINAL HERNIA   Location: [facility] OR ROOM 02 / [facility]   Surgeons: Jovita Kussmaul, MD       DISCUSSION: Patient is an 81 year old male scheduled for the above procedure.   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  Alcohol intake documented as 21 glasses of wine, 6 cans of beer, 12 shots of liquor per week. He had labs on 08/17/22. Total bilirubin 1.9 with normal AST 19, ALT 14. CBC was normal.  Preoperative cardiology assessment on 08/14/22 by Diona Browner, NP, "Preoperative Cardiovascular Risk Assessment:   According to the Revised Cardiac Risk Index (RCRI), his Perioperative Risk of Major Cardiac Event is (%): 0.4. His Functional Capacity in METs is: 4.4 according to the [facility] Activity Status Index (DASI).Therefore, based on ACC/AHA guidelines, patient would be at acceptable risk for the planned procedure without further cardiovascular testing.   The patient was advised that if he develops new symptoms prior to surgery to contact our office to arrange for a follow-up visit, and he verbalized understanding.   Regarding ASA therapy, we recommend continuation of ASA throughout the perioperative period.  However, if the surgeon feels that cessation of ASA is required in the perioperative period, it may be stopped 5-7 days prior to surgery with a plan to resume it as soon as felt to be feasible from a surgical standpoint in the post-operative period."  Family to clarify ASA instructions--they believe he was instructed to hold for 3 days. Anesthesia team to evaluate on the day of surgery.    VS: BP 106/73   Pulse 84    Temp 36.8 C (Oral)   Resp 15   Ht '5\' 10"'$  (1.778 m)   Wt 73 kg   SpO2 100%   BMI 23.09 kg/m   PROVIDERS: Isaac Bliss, Rayford Halsted, MD is PCP  Shelva Majestic, MD is cardiologist Metta Clines, DO is neurologist   LABS: Most recent lab results in [facility] include: Lab Results  Component Value Date   WBC 7.2 08/17/2022   HGB 14.5 08/17/2022   HCT 43.7 08/17/2022   PLT 193 08/17/2022   GLUCOSE 92 08/17/2022   ALT 14 08/17/2022   AST 19 08/17/2022   NA 133 (L) 08/17/2022   K 3.9 08/17/2022   CL 99 08/17/2022   CREATININE 0.72 08/17/2022   BUN 16 08/17/2022   CO2 25 08/17/2022   TSH 1.87 03/22/2022   HGBA1C 5.3 03/22/2022      IMAGES: CT Abd/pelvis 08/17/22: IMPRESSION: 1. No acute process demonstrated in the abdomen or pelvis. No evidence of bowel obstruction or inflammation. 2. No discrete hernia is demonstrated but there is soft tissue thickening in the right inguinal canal which could indicate inflammatory reaction or postoperative change. This could be related to an intermittent reducible hernia.    EKG: 04/24/22 ([facility]): Afib at 62 bpm. LAD. Incomplete RBBB. Inferior infarct (age undetermined). Anteroseptal infarct (age undetermined).   CV: Echo 08/04/20:  1. Bioprosthetic valve with AT< 100, DVI 43. Aortic valve regurgitation  is not visualized. There is a 25 mm bioprosthetic valve present in the  aortic position. Procedure Date: December 2013.   2. Left  ventricular ejection fraction, by estimation, is 55 to 60%. The  left ventricle has normal function. The left ventricle has no regional  wall motion abnormalities. There is moderate left ventricular hypertrophy.  Left ventricular diastolic  parameters are indeterminate.   3. Right ventricular systolic function is normal. The right ventricular  size is normal. There is normal pulmonary artery systolic pressure.   4. Left atrial size was severely dilated.   5. Right atrial size was severely dilated.    6. The mitral valve is grossly normal. Trivial mitral valve  regurgitation.  - Comparison(s): No significant change from prior study.    Past Medical History:  Diagnosis Date   Aortic valve disease    Atrial fibrillation (HCC)    BPH (benign prostatic hyperplasia)    Chronic neck pain    Complication of anesthesia    passed during surgery - valve replacement 2013 at [facility]   Dementia [facility])    GERD (gastroesophageal reflux disease)    History of colonic polyps    History of heavy alcohol consumption    Hyperlipidemia    Hypertension    Osteoarthritis    Persistent atrial fibrillation (HCC)    Polio    Prostate cancer [facility])     Past Surgical History:  Procedure Laterality Date   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   CARDIOVERSION  09/2016   HERNIA REPAIR  1990   INSERTION PROSTATE RADIATION SEED  2002    MEDICATIONS:  aspirin EC 81 MG tablet   atorvastatin (LIPITOR) 10 MG tablet   docusate sodium (COLACE) 100 MG capsule   donepezil (ARICEPT) 5 MG tablet   HYDROcodone-acetaminophen (NORCO/VICODIN) 5-325 MG tablet   memantine (NAMENDA) 10 MG tablet   metoprolol tartrate (LOPRESSOR) 50 MG tablet   ondansetron (ZOFRAN) 4 MG tablet   sertraline (ZOLOFT) 25 MG tablet   tamsulosin (FLOMAX) 0.4 MG CAPS capsule   traMADol (ULTRAM) 50 MG tablet   No current facility-administered medications for this encounter.    Myra Gianotti, PA-C Surgical Short Stay/Anesthesiology [facility] Phone [phone] [facility] Phone [phone] 08/24/2022 6:35 PM

## 2022-08-24 NOTE — Progress Notes (Signed)
PCP - Leotis Shames Cardiologist - Dr. Claiborne Billings at [facility] with [facility]  PPM/ICD - Denies Device Orders -  Rep Notified -   Chest x-ray - NI EKG - 04/24/22 Stress Test - Unsure ECHO - 08/04/20 Cardiac Cath - Denies  Sleep Study - Denies CPAP -   DM - Denies  Blood Thinner Instructions: Denies Aspirin Instructions:Requested patient to call surgeon to find out instruction on when to stop. Family with Mr Gongora believes it may be 3 days before, daughter will double check.   ERAS Protcol -Yes  COVID TEST- NI   Anesthesia review: Yes cardiac history  Patient denies shortness of breath, fever, cough and chest pain at PAT appointment   All instructions explained to the patient, with a verbal understanding of the material. Patient agrees to go over the instructions while at home for a better understanding.  The opportunity to ask questions was provided.

## 2022-08-26 ENCOUNTER — Other Ambulatory Visit: Payer: Self-pay | Admitting: Neurology

## 2022-08-27 ENCOUNTER — Other Ambulatory Visit: Payer: Self-pay

## 2022-08-27 ENCOUNTER — Ambulatory Visit (HOSPITAL_COMMUNITY): Payer: Medicare Other | Admitting: Vascular Surgery

## 2022-08-27 ENCOUNTER — Encounter (HOSPITAL_COMMUNITY)
Admission: RE | Disposition: A | Discharge status: Home / Self Care | Payer: Self-pay | Source: Home / Self Care | Attending: General Surgery

## 2022-08-27 ENCOUNTER — Encounter (HOSPITAL_COMMUNITY): Payer: Self-pay | Admitting: General Surgery

## 2022-08-27 ENCOUNTER — Ambulatory Visit (HOSPITAL_BASED_OUTPATIENT_CLINIC_OR_DEPARTMENT_OTHER): Payer: Medicare Other | Admitting: Anesthesiology

## 2022-08-27 ENCOUNTER — Ambulatory Visit (HOSPITAL_COMMUNITY)
Admission: RE | Admit: 2022-08-27 | Discharge: 2022-08-27 | Disposition: A | Discharge status: Home / Self Care | Payer: Medicare Other | Attending: General Surgery | Admitting: General Surgery

## 2022-08-27 DIAGNOSIS — Z87891 Personal history of nicotine dependence: Secondary | ICD-10-CM | POA: Diagnosis not present

## 2022-08-27 DIAGNOSIS — M1990 Unspecified osteoarthritis, unspecified site: Secondary | ICD-10-CM | POA: Diagnosis not present

## 2022-08-27 DIAGNOSIS — I509 Heart failure, unspecified: Secondary | ICD-10-CM | POA: Diagnosis not present

## 2022-08-27 DIAGNOSIS — K4090 Unilateral inguinal hernia, without obstruction or gangrene, not specified as recurrent: Secondary | ICD-10-CM

## 2022-08-27 DIAGNOSIS — F0390 Unspecified dementia without behavioral disturbance: Secondary | ICD-10-CM | POA: Diagnosis not present

## 2022-08-27 DIAGNOSIS — Z66 Do not resuscitate: Secondary | ICD-10-CM | POA: Insufficient documentation

## 2022-08-27 DIAGNOSIS — I110 Hypertensive heart disease with heart failure: Secondary | ICD-10-CM

## 2022-08-27 DIAGNOSIS — J449 Chronic obstructive pulmonary disease, unspecified: Secondary | ICD-10-CM | POA: Insufficient documentation

## 2022-08-27 DIAGNOSIS — I4891 Unspecified atrial fibrillation: Secondary | ICD-10-CM | POA: Insufficient documentation

## 2022-08-27 HISTORY — PX: INSERTION OF MESH: SHX5868

## 2022-08-27 HISTORY — PX: INGUINAL HERNIA REPAIR: SHX194

## 2022-08-27 SURGERY — REPAIR, HERNIA, INGUINAL, ADULT
Anesthesia: General | Site: Groin | Laterality: Right

## 2022-08-27 MED ORDER — CHLORHEXIDINE GLUCONATE CLOTH 2 % EX PADS: 6.0000 | MEDICATED_PAD | Freq: Once | CUTANEOUS | Status: DC

## 2022-08-27 MED ORDER — PHENYLEPHRINE 80 MCG/ML (10ML) SYRINGE FOR IV PUSH (FOR BLOOD PRESSURE SUPPORT)
PREFILLED_SYRINGE | INTRAVENOUS | Status: DC | PRN
  Administered 2022-08-27: 160 ug via INTRAVENOUS
  Administered 2022-08-27: 80 ug via INTRAVENOUS
  Administered 2022-08-27: 160 ug via INTRAVENOUS
  Administered 2022-08-27 (×2): 80 ug via INTRAVENOUS
  Administered 2022-08-27: 160 ug via INTRAVENOUS

## 2022-08-27 MED ORDER — ACETAMINOPHEN 500 MG PO TABS
ORAL_TABLET | ORAL | Status: AC
  Administered 2022-08-27: 1000 mg via ORAL
  Filled 2022-08-27: qty 2

## 2022-08-27 MED ORDER — CEFAZOLIN SODIUM-DEXTROSE 2-4 GM/100ML-% IV SOLN
INTRAVENOUS | Status: AC
  Filled 2022-08-27: qty 100

## 2022-08-27 MED ORDER — SUGAMMADEX SODIUM 200 MG/2ML IV SOLN
INTRAVENOUS | Status: DC | PRN
  Administered 2022-08-27: 146 mg via INTRAVENOUS

## 2022-08-27 MED ORDER — ORAL CARE MOUTH RINSE: 15.0000 mL | Freq: Once | OROMUCOSAL | Status: AC

## 2022-08-27 MED ORDER — ONDANSETRON HCL 4 MG/2ML IJ SOLN
INTRAMUSCULAR | Status: DC | PRN
  Administered 2022-08-27: 4 mg via INTRAVENOUS

## 2022-08-27 MED ORDER — FENTANYL CITRATE (PF) 250 MCG/5ML IJ SOLN
INTRAMUSCULAR | Status: AC
  Filled 2022-08-27: qty 5

## 2022-08-27 MED ORDER — CEFAZOLIN SODIUM-DEXTROSE 2-4 GM/100ML-% IV SOLN
2.0000 g | INTRAVENOUS | Status: AC
  Administered 2022-08-27: 2 g via INTRAVENOUS

## 2022-08-27 MED ORDER — CHLORHEXIDINE GLUCONATE 0.12 % MT SOLN
OROMUCOSAL | Status: AC
  Administered 2022-08-27: 15 mL via OROMUCOSAL
  Filled 2022-08-27: qty 15

## 2022-08-27 MED ORDER — FENTANYL CITRATE (PF) 250 MCG/5ML IJ SOLN
INTRAMUSCULAR | Status: DC | PRN
  Administered 2022-08-27: 50 ug via INTRAVENOUS

## 2022-08-27 MED ORDER — BUPIVACAINE-EPINEPHRINE (PF) 0.25% -1:200000 IJ SOLN
INTRAMUSCULAR | Status: AC
  Filled 2022-08-27: qty 30

## 2022-08-27 MED ORDER — DEXAMETHASONE SODIUM PHOSPHATE 10 MG/ML IJ SOLN
INTRAMUSCULAR | Status: AC
  Filled 2022-08-27: qty 2

## 2022-08-27 MED ORDER — STERILE WATER FOR IRRIGATION IR SOLN
Status: DC | PRN
  Administered 2022-08-27: 1000 mL

## 2022-08-27 MED ORDER — PHENYLEPHRINE HCL-NACL 20-0.9 MG/250ML-% IV SOLN
INTRAVENOUS | Status: DC | PRN
  Administered 2022-08-27: 25 ug/min via INTRAVENOUS

## 2022-08-27 MED ORDER — ONDANSETRON HCL 4 MG/2ML IJ SOLN
INTRAMUSCULAR | Status: AC
  Filled 2022-08-27: qty 4

## 2022-08-27 MED ORDER — ROCURONIUM BROMIDE 10 MG/ML (PF) SYRINGE
PREFILLED_SYRINGE | INTRAVENOUS | Status: DC | PRN
  Administered 2022-08-27: 40 mg via INTRAVENOUS
  Administered 2022-08-27: 10 mg via INTRAVENOUS

## 2022-08-27 MED ORDER — BUPIVACAINE-EPINEPHRINE 0.25% -1:200000 IJ SOLN
INTRAMUSCULAR | Status: DC | PRN
  Administered 2022-08-27: 17 mL

## 2022-08-27 MED ORDER — LIDOCAINE 2% (20 MG/ML) 5 ML SYRINGE
INTRAMUSCULAR | Status: DC | PRN
  Administered 2022-08-27: 60 mg via INTRAVENOUS

## 2022-08-27 MED ORDER — LACTATED RINGERS IV SOLN: INTRAVENOUS | Status: DC

## 2022-08-27 MED ORDER — OXYCODONE HCL 5 MG PO TABS: 5.0000 mg | ORAL_TABLET | Freq: Four times a day (QID) | ORAL | 0 refills | Status: DC | PRN

## 2022-08-27 MED ORDER — GABAPENTIN 100 MG PO CAPS
100.0000 mg | ORAL_CAPSULE | ORAL | Status: AC
  Administered 2022-08-27: 100 mg via ORAL
  Filled 2022-08-27: qty 1

## 2022-08-27 MED ORDER — PROPOFOL 10 MG/ML IV BOLUS
INTRAVENOUS | Status: DC | PRN
  Administered 2022-08-27: 100 mg via INTRAVENOUS

## 2022-08-27 MED ORDER — 0.9 % SODIUM CHLORIDE (POUR BTL) OPTIME
TOPICAL | Status: DC | PRN
  Administered 2022-08-27: 1000 mL

## 2022-08-27 MED ORDER — PHENYLEPHRINE 80 MCG/ML (10ML) SYRINGE FOR IV PUSH (FOR BLOOD PRESSURE SUPPORT)
PREFILLED_SYRINGE | INTRAVENOUS | Status: AC
  Filled 2022-08-27: qty 10

## 2022-08-27 MED ORDER — DEXAMETHASONE SODIUM PHOSPHATE 10 MG/ML IJ SOLN
INTRAMUSCULAR | Status: DC | PRN
  Administered 2022-08-27: 5 mg via INTRAVENOUS

## 2022-08-27 MED ORDER — ACETAMINOPHEN 500 MG PO TABS: 1000.0000 mg | ORAL_TABLET | ORAL | Status: DC

## 2022-08-27 MED ORDER — CHLORHEXIDINE GLUCONATE 0.12 % MT SOLN: 15.0000 mL | Freq: Once | OROMUCOSAL | Status: AC

## 2022-08-27 MED ORDER — ACETAMINOPHEN 500 MG PO TABS: 1000.0000 mg | ORAL_TABLET | ORAL | Status: AC

## 2022-08-27 SURGICAL SUPPLY — 36 items
BAG COUNTER SPONGE SURGICOUNT (BAG) ×1 IMPLANT
BLADE CLIPPER SURG (BLADE) IMPLANT
CHLORAPREP W/TINT 26 (MISCELLANEOUS) ×1 IMPLANT
COVER SURGICAL LIGHT HANDLE (MISCELLANEOUS) ×1 IMPLANT
DERMABOND ADVANCED .7 DNX12 (GAUZE/BANDAGES/DRESSINGS) ×1 IMPLANT
DRAIN PENROSE 1/2X12 LTX STRL (WOUND CARE) IMPLANT
DRAPE LAPAROSCOPIC ABDOMINAL (DRAPES) ×1 IMPLANT
ELECT REM PT RETURN 9FT ADLT (ELECTROSURGICAL) ×1
ELECTRODE REM PT RTRN 9FT ADLT (ELECTROSURGICAL) ×1 IMPLANT
GLOVE BIO SURGEON STRL SZ7.5 (GLOVE) ×1 IMPLANT
GOWN STRL REUS W/ TWL LRG LVL3 (GOWN DISPOSABLE) ×2 IMPLANT
GOWN STRL REUS W/TWL LRG LVL3 (GOWN DISPOSABLE) ×3
KIT BASIN OR (CUSTOM PROCEDURE TRAY) ×1 IMPLANT
KIT TURNOVER KIT B (KITS) ×1 IMPLANT
MESH ULTRAPRO 3X6 7.6X15CM (Mesh General) IMPLANT
NDL HYPO 25GX1X1/2 BEV (NEEDLE) ×1 IMPLANT
NEEDLE HYPO 25GX1X1/2 BEV (NEEDLE) ×1 IMPLANT
NS IRRIG 1000ML POUR BTL (IV SOLUTION) ×1 IMPLANT
PACK GENERAL/GYN (CUSTOM PROCEDURE TRAY) ×1 IMPLANT
PAD ARMBOARD 7.5X6 YLW CONV (MISCELLANEOUS) ×2 IMPLANT
PENCIL SMOKE EVACUATOR (MISCELLANEOUS) ×1 IMPLANT
SPIKE FLUID TRANSFER (MISCELLANEOUS) ×1 IMPLANT
SUT MNCRL AB 4-0 PS2 18 (SUTURE) ×1 IMPLANT
SUT PROLENE 2 0 SH DA (SUTURE) ×2 IMPLANT
SUT SILK 2 0 SH (SUTURE) IMPLANT
SUT SILK 3 0 (SUTURE) ×1
SUT SILK 3-0 18XBRD TIE 12 (SUTURE) ×1 IMPLANT
SUT VIC AB 0 CT1 27 (SUTURE) ×1
SUT VIC AB 0 CT1 27XBRD ANBCTR (SUTURE) ×1 IMPLANT
SUT VIC AB 2-0 SH 27 (SUTURE) ×1
SUT VIC AB 2-0 SH 27X BRD (SUTURE) ×1 IMPLANT
SUT VIC AB 3-0 SH 27 (SUTURE) ×1
SUT VIC AB 3-0 SH 27XBRD (SUTURE) ×1 IMPLANT
SYR CONTROL 10ML LL (SYRINGE) ×1 IMPLANT
TOWEL GREEN STERILE (TOWEL DISPOSABLE) ×1 IMPLANT
TOWEL GREEN STERILE FF (TOWEL DISPOSABLE) ×1 IMPLANT

## 2022-08-27 NOTE — Anesthesia Postprocedure Evaluation (Signed)
Anesthesia Post Note  Patient: Marcus Galloway  Procedure(s) Performed: OPEN RIGHT INGUINAL HERNIA REPAIR WITH MESH (Right: Groin) INSERTION OF MESH (Right: Groin)     Patient location during evaluation: PACU Anesthesia Type: General Level of consciousness: sedated and patient cooperative Pain management: pain level controlled Vital Signs Assessment: post-procedure vital signs reviewed and stable Respiratory status: spontaneous breathing Cardiovascular status: stable Anesthetic complications: no   No notable events documented.  Last Vitals:  Vitals:   08/27/22 1500 08/27/22 1515  BP: 113/71 111/70  Pulse: 92 77  Resp: 13 17  Temp:  36.6 C  SpO2: 97% 95%    Last Pain:  Vitals:   08/27/22 1515  TempSrc:   PainSc: [place]

## 2022-08-27 NOTE — Transfer of Care (Signed)
Immediate Anesthesia Transfer of Care Note  Patient: Marcus Galloway  Procedure(s) Performed: OPEN RIGHT INGUINAL HERNIA REPAIR WITH MESH (Right: Groin) INSERTION OF MESH (Right: Groin)  Patient Location: PACU  Anesthesia Type:General  Level of Consciousness: awake and alert   Airway & Oxygen Therapy: Patient Spontanous Breathing and Patient connected to nasal cannula oxygen  Post-op Assessment: Report given to RN and Post -op Vital signs reviewed and stable  Post vital signs: Reviewed and stable  Last Vitals:  Vitals Value Taken Time  BP 111/67 08/27/22 1445  Temp 36.4 C 08/27/22 1445  Pulse 80 08/27/22 1451  Resp 17 08/27/22 1451  SpO2 98 % 08/27/22 1451  Vitals shown include unvalidated device data.  Last Pain:  Vitals:   08/27/22 1445  TempSrc:   PainSc: 0-No pain         Complications: No notable events documented.

## 2022-08-27 NOTE — Op Note (Signed)
08/27/2022  2:37 PM  PATIENT:  Marcus Galloway  81 y.o. male  PRE-OPERATIVE DIAGNOSIS:  RIGHT INGUINAL HERNIA  POST-OPERATIVE DIAGNOSIS:  RIGHT INGUINAL HERNIA  PROCEDURE:  Procedure(s): OPEN RIGHT INGUINAL HERNIA REPAIR WITH MESH (Right) INSERTION OF MESH (Right)  SURGEON:  Surgeon(s) and Role:    * Jovita Kussmaul, MD - Primary  PHYSICIAN ASSISTANT:   ASSISTANTS: none   ANESTHESIA:   local and general  EBL:  minimal   BLOOD ADMINISTERED:none  DRAINS: none   LOCAL MEDICATIONS USED:  MARCAINE     SPECIMEN:  Source of Specimen:  hernia sac  DISPOSITION OF SPECIMEN:  PATHOLOGY  COUNTS:  YES  TOURNIQUET:  * No tourniquets in log *  DICTATION: .Dragon Dictation  After informed consent was obtained the patient was brought to the operating room and placed in the supine position on the operating table.  After adequate induction of general anesthesia the patient's abdomen and right groin were prepped with ChloraPrep, allowed to dry, and draped in usual sterile manner.  An appropriate timeout was performed.  The right groin was then infiltrated with quarter percent Marcaine.  A small incision was made from the edge of the pubic tubercle towards the anterior superior iliac spine with a 15 blade knife.  The incision was carried through the skin and subcutaneous tissue sharply with the electrocautery until the fascia of the external oblique was encountered.  A small bridging vein was clamped with hemostats, divided, and ligated with 3-0 silk ties.  The external oblique fascia was opened along its fibers towards the apex of the external ring with a 15 blade knife and Metzenbaum scissors.  A [place] retractor was deployed.  Blunt dissection was carried out of the cord structures until they could be surrounded between 2 fingers.  Half inch Penrose drain was placed around the cord structures for retraction purposes.  The cord was then gently skeletonized by blunt hemostat dissection.  I was  able to identify a hernia sac and gently dissected this away from the rest of the cord structures.  Care was taken to avoid injury to the vas deferens and testicular vessels.  The hernia sac was then opened and there were no visceral contents within the sac.  The sac was ligated near its base with a 2-0 silk suture ligature.  The distal sac was excised and sent to pathology.  The stump of the sac was allowed to retract back beneath the floor of the canal.  The floor the canal was then repaired with a figure-of-eight 0 Vicryl stitch.  Next a 3 x 6 piece of ultra Pro mesh was chosen and cut to the appropriate size.  The mesh was sewed inferiorly to the shelving edge of the inguinal ligament with a running 2-0 Prolene stitch.  Tails were cut in the mesh laterally and the tails were wrapped around the cord structures.  Medially and superiorly the mesh was sewed to the muscular aponeurotic strength of the transversalis with interrupted 2-0 Prolene vertical mattress stitches.  Lateral to the cord the tails of the mesh were anchored to the shelving edge of the inguinal ligament with interrupted 2-0 Prolene stitch.  Once this was accomplished the mesh appeared to be in good position and the hernia seem well repaired.  The wound was irrigated with copious amounts of saline.  During the dissection the ilioinguinal nerve was identified and involved with some scar tissue.  It was dissected out proximally and distally, clamped, divided, and ligated with  3-0 silk ties.  Next the external oblique fascia was reapproximated with a running 2-0 Vicryl stitch.  The wound was then infiltrated with more quarter percent Marcaine.  The subcutaneous fascia was closed with a running 3-0 Vicryl stitch.  The skin was closed with a running 4-0 Monocryl subcuticular stitch.  Dermabond dressings were applied.  The patient tolerated the procedure well.  At the end of the case all needle sponge and instrument counts were correct.  The patient was  then awakened and taken to recovery in stable condition.  The patient's testicle was in his scrotum at the end of the case.  PLAN OF CARE: Discharge to home after PACU  PATIENT DISPOSITION:  PACU - hemodynamically stable.   Delay start of Pharmacological VTE agent (>24hrs) due to surgical blood loss or risk of bleeding: not applicable

## 2022-08-27 NOTE — Anesthesia Procedure Notes (Signed)
Procedure Name: Intubation Date/Time: 08/27/2022 1:38 PM  Performed by: Colin Benton, CRNAPre-anesthesia Checklist: Patient identified, Emergency Drugs available, Suction available and Patient being monitored Patient Re-evaluated:Patient Re-evaluated prior to induction Oxygen Delivery Method: Circle system utilized Preoxygenation: Pre-oxygenation with 100% oxygen Induction Type: IV induction Ventilation: Mask ventilation without difficulty Laryngoscope Size: Miller and 2 Grade View: Grade I Tube type: Oral Tube size: 7.5 mm Number of attempts: 1 Airway Equipment and Method: Stylet Placement Confirmation: ETT inserted through vocal cords under direct vision, positive ETCO2 and breath sounds checked- equal and bilateral Secured at: 23 cm Tube secured with: Tape Dental Injury: Teeth and Oropharynx as per pre-operative assessment

## 2022-08-27 NOTE — H&P (Signed)
REFERRING PHYSICIAN: Jeanie Sewer  PROVIDER: Landry Corporal, MD  MRN: [id number] DOB: [birth year] Subjective   Chief Complaint: New Consultation (hernia)   History of Present Illness: Marcus Galloway is a 81 y.o. male who is seen today as an office consultation for evaluation of New Consultation (hernia) .   We are asked to see the patient in consultation by Dr. Felipe Drone to evaluate him for an inguinal hernia. The patient is a 81 year old white male who has been noticing a bulge in the right groin for the last few weeks. Over the last couple weeks it seems to be causing him some discomfort. He denies any nausea or vomiting. He denies any fevers or chills. He has also noticed a smaller bulge in the left groin but there is no discomfort associated with it. He does have some significant cardiac history. He also has dementia. He has a DNR in place.  Review of Systems: A complete review of systems was obtained from the patient. I have reviewed this information and discussed as appropriate with the patient. See HPI as well for other ROS.  ROS   Medical History: Past Medical History:  Diagnosis Date  Aortic stenosis  Arrhythmia  Arthritis  Chest pain  CHF NYHA class II (CMS-HCC) 10/14/2012  Diverticulosis  GERD (gastroesophageal reflux disease)  History of tobacco use  Kidney stones  Palpitations  Polio  Prostate cancer (CMS-HCC)  SOB (shortness of breath)   Patient Active Problem List  Diagnosis  Aortic stenosis  CHF NYHA class II (CMS-HCC)  GERD (gastroesophageal reflux disease)  Prostate cancer (CMS-HCC)  Arthritis  Arrhythmia  ETOH abuse  Acute blood loss anemia  Hypovolemia  S/P aortic valve replacement  Shock (CMS-HCC)  Coagulopathy (CMS-HCC)  Non-recurrent unilateral inguinal hernia without obstruction or gangrene   Past Surgical History:  Procedure Laterality Date  Brachytherapy - prostate 2001  AVR #23 Pericardial 10/17/2012  Dr. Evelina Dun at [facility] N/A 10/17/2012  Procedure: TRANSESOPHAGEAL ECHOCARDIOGRAPHY; Surgeon: Elna Breslow, MD; Location: [facility] OPERATING ROOMS; Service: Cardiothoracic; Laterality: N/A;  REPLACEMENT AORTIC VALVE N/A 10/17/2012  Procedure: REPLACEMENT AORTIC VALVE; Surgeon: Elna Breslow, MD; Location: [facility] OPERATING ROOMS; Service: Cardiothoracic; Laterality: N/A; Median sternotomy  HERNIA REPAIR  SKIN BIOPSY  Umbilical hernia repair    No Known Allergies  Current Outpatient Medications on File Prior to Visit  Medication Sig Dispense Refill  aspirin 81 MG EC tablet Take 81 mg by mouth daily.  atorvastatin (LIPITOR) 10 MG tablet Take 10 mg by mouth once daily  cyanocobalamin (VITAMIN B12) 1,000 mcg/mL injection Inject into the muscle monthly  donepeziL (ARICEPT ODT) 5 MG disintegrating tablet Take 5 mg by mouth at bedtime  omeprazole (PRILOSEC OTC) 20 MG tablet Take 20 mg by mouth daily.  sertraline (ZOLOFT) 50 MG tablet Take 50 mg by mouth daily.  tamsulosin (FLOMAX) 0.4 mg capsule Take 0.4 mg by mouth daily.  ascorbic acid (VITAMIN C) 500 MG tablet Take 500 mg by mouth daily.  cephalexin (KEFLEX) 500 MG capsule daily.  diazepam (VALIUM) 5 MG tablet Take 5 mg by mouth every 12 (twelve) hours as needed.  doxycycline (MONODOX) 100 MG capsule daily.  ferrous fumarate 324 mg (106 mg iron) tablet Take 1 tablet (324 mg total) by mouth 2 (two) times daily with meals. 30 each  HYDROcodone-acetaminophen (NORCO) 5-325 mg tablet  multivitamin tablet Take 1 tablet by mouth daily.  NITROSTAT 0.4 mg SL tablet 3 (three) times daily.   No current facility-administered medications on file  prior to visit.   Family History  Adopted: Yes  Problem Relation Age of Onset  Myocardial Infarction (Heart attack) Father  Cancer Mother  Myocardial Infarction (Heart attack) Paternal Aunt  Myocardial Infarction (Heart attack) Paternal Uncle  Myocardial Infarction (Heart attack) Paternal  Grandfather    Social History   Tobacco Use  Smoking Status Former  Packs/day: 2.00  Years: 29.00  Pack years: 58.00  Types: Cigarettes  Quit date: 10/10/1979  Years since quitting: 42.8  Smokeless Tobacco Not on file    Social History   Socioeconomic History  Marital status: Married  Tobacco Use  Smoking status: Former  Packs/day: 2.00  Years: 29.00  Pack years: 58.00  Types: Cigarettes  Quit date: 10/10/1979  Years since quitting: 42.8  Substance and Sexual Activity  Alcohol use: Yes  Alcohol/week: 18.0 standard drinks  Types: 9 Cans of beer, 3 Glasses of wine, 6 Shots of liquor per week  Drug use: No  Sexual activity: Yes   Objective:   Vitals:  BP: 90/60  Pulse: 83  Temp: 36.7 C (98 F)  SpO2: 98%  Weight: 73.9 kg (163 lb)  Height: 170.2 cm ('5\' 7"'$ )   Body mass index is 25.53 kg/m.  Physical Exam Constitutional:  General: He is not in acute distress. Appearance: Normal appearance.  HENT:  Head: Normocephalic and atraumatic.  Right Ear: External ear normal.  Left Ear: External ear normal.  Nose: Nose normal.  Mouth/Throat:  Mouth: Mucous membranes are moist.  Pharynx: Oropharynx is clear.  Eyes:  General: No scleral icterus. Extraocular Movements: Extraocular movements intact.  Conjunctiva/sclera: Conjunctivae normal.  Pupils: Pupils are equal, round, and reactive to light.  Cardiovascular:  Rate and Rhythm: Normal rate and regular rhythm.  Pulses: Normal pulses.  Heart sounds: Normal heart sounds.  Pulmonary:  Effort: Pulmonary effort is normal. No respiratory distress.  Breath sounds: Normal breath sounds.  Abdominal:  General: Abdomen is flat. Bowel sounds are normal. There is no distension.  Palpations: Abdomen is soft.  Tenderness: There is no abdominal tenderness.  Comments: There are well-healed midline scars  Genitourinary: Comments: There is a small reducible bulge in the right groin laterally with some mild tenderness to  palpation. There is an even smaller bulge in the left groin laterally with no tenderness at all Musculoskeletal:  General: No swelling or deformity. Normal range of motion.  Cervical back: Normal range of motion and neck supple. No tenderness.  Skin: General: Skin is warm and dry.  Coloration: Skin is not jaundiced.  Neurological:  General: No focal deficit present.  Mental Status: He is alert and oriented to person, place, and time.  Psychiatric:  Mood and Affect: Mood normal.  Behavior: Behavior normal.    Labs, Imaging and Diagnostic Testing:  Assessment and Plan:   Diagnoses and all orders for this visit:  Non-recurrent unilateral inguinal hernia without obstruction or gangrene - [facility] Case Posting Request; Future    The patient appears to have a small right inguinal hernia that is symptomatic and possibly an even smaller left inguinal hernia that is not symptomatic. Because of the risk of incarceration and strangulation and because he is having some discomfort associated with the right hernia I feel he would probably benefit from having this fixed. He would also like to have this done. We would need clearance from his cardiologist before scheduling surgery. He would need to be intubated for the operation so the DNR would not apply but he does state that if his heart  stopped during surgery he would not want chest compressions or any measures to resuscitate him. The question will be whether we should do both sides since he will be under anesthesia anyway versus just doing the 1 side to try to minimize his exposure to the anesthesia and get him out of the operating room as quickly as possible. The family and patient will think about this and let us know. We will begin with cardiac clearance and then proceed with scheduling if cleared

## 2022-08-27 NOTE — Interval H&P Note (Signed)
History and Physical Interval Note:  08/27/2022 12:19 PM  Marcus Galloway  has presented today for surgery, with the diagnosis of RIGHT INGUINAL HERNIA.  The various methods of treatment have been discussed with the patient and family. After consideration of risks, benefits and other options for treatment, the patient has consented to  Procedure(s): OPEN RIGHT INGUINAL HERNIA REPAIR WITH MESH (Right) as a surgical intervention.  The patient's history has been reviewed, patient examined, no change in status, stable for surgery.  I have reviewed the patient's chart and labs.  Questions were answered to the patient's satisfaction.     [name]

## 2022-08-28 ENCOUNTER — Encounter (HOSPITAL_COMMUNITY): Payer: Self-pay | Admitting: General Surgery

## 2022-08-28 ENCOUNTER — Other Ambulatory Visit (HOSPITAL_COMMUNITY): Payer: Medicare Other

## 2022-08-28 LAB — SURGICAL PATHOLOGY

## 2022-08-29 NOTE — Patient Instructions (Signed)
Visit Information  Thank you for taking time to visit with me today. Please don't hesitate to contact me if I can be of assistance to you.   Following are the goals we discussed today:   Goals Addressed             This Visit's Progress    Obtain Supportive Resources to strengthen independence   On track    Care Coordination Interventions: Active listening / Reflection utilized  Emotional Support Provided Caregiver stress acknowledged  Verbalization of feelings encouraged  Patient spoke with pts' daughter, [name]. She states pt is "doing well" [name] endorsed extreme difficulty scheduling pt for surgery with [facility], to properly assist in management of pain symptoms, resulting in ED visit, where pt was prescribed a Truss device to assist with discomfort from hernia Pt has been scheduled for surgery and strong support to assist him during recovery         Our next appointment is by telephone on 09/25/22 at 11 AM  Please call the care guide team at [phone] if you need to cancel or reschedule your appointment.   If you are experiencing a Mental Health or [place] or need someone to talk to, please call the Suicide and Crisis Lifeline: 988 call 911   Patient verbalizes understanding of instructions and care plan provided today and agrees to view in [place]. Active MyChart status and patient understanding of how to access instructions and care plan via MyChart confirmed with patient.     [name], MSW, [email or web address] Phone [phone] 5:48 PM

## 2022-08-29 NOTE — Patient Outreach (Signed)
  Care Coordination   Initial Visit Note   08/29/2022 Name: Marcus Galloway MRN: [id number] DOB: 1941-07-11  Marcus Galloway is a 81 y.o. year old male who sees Isaac Bliss, Rayford Halsted, MD for primary care. I spoke with  [name] by phone today.  What matters to the patients health and wellness today?  Care Coordination    Goals Addressed             This Visit's Progress    Obtain Supportive Resources to strengthen independence   On track    Care Coordination Interventions: Active listening / Reflection utilized  Emotional Support Provided Caregiver stress acknowledged  Verbalization of feelings encouraged  Patient spoke with pts' daughter, [name]. She states pt is "doing well" [name] endorsed extreme difficulty scheduling pt for surgery with [facility], to properly assist in management of pain symptoms, resulting in ED visit, where pt was prescribed a Truss device to assist with discomfort from hernia Pt has been scheduled for surgery and strong support to assist him during recovery         SDOH assessments and interventions completed:  Yes  SDOH Interventions Today    Flowsheet Row Most Recent Value  SDOH Interventions   Housing Interventions Intervention Not Indicated  Transportation Interventions Intervention Not Indicated        Care Coordination Interventions Activated:  Yes  Care Coordination Interventions:  Yes, provided   Follow up plan: Follow up call scheduled for 4-6 weeks    Encounter Outcome:  Pt. Visit Completed   [name], MSW, [email or web address] Phone [phone] 5:47 PM

## 2022-09-04 ENCOUNTER — Telehealth: Payer: Self-pay

## 2022-09-04 ENCOUNTER — Ambulatory Visit (INDEPENDENT_AMBULATORY_CARE_PROVIDER_SITE_OTHER): Payer: Medicare Other

## 2022-09-04 DIAGNOSIS — Z23 Encounter for immunization: Secondary | ICD-10-CM

## 2022-09-04 DIAGNOSIS — E538 Deficiency of other specified B group vitamins: Secondary | ICD-10-CM | POA: Diagnosis not present

## 2022-09-04 MED ORDER — CYANOCOBALAMIN 1000 MCG/ML IJ SOLN
1000.0000 ug | Freq: Once | INTRAMUSCULAR | Status: AC
  Administered 2022-09-04: 1000 ug via INTRAMUSCULAR

## 2022-09-04 NOTE — Telephone Encounter (Signed)
Per Dr Jerilee Hoh, continue monthly b12 injections. Last B12 lab 03/22/22 was 1504

## 2022-09-04 NOTE — Progress Notes (Signed)
Pt here for monthly B12 injection per Dr Jerilee Hoh.  B12 1010mg given IM right deltoid and pt tolerated injection well.  Next B12 injection scheduled for 10/09/22.

## 2022-09-16 ENCOUNTER — Other Ambulatory Visit: Payer: Self-pay | Admitting: Neurology

## 2022-09-25 ENCOUNTER — Encounter: Payer: Self-pay | Admitting: Licensed Clinical Social Worker

## 2022-09-25 ENCOUNTER — Ambulatory Visit: Payer: Self-pay | Admitting: Licensed Clinical Social Worker

## 2022-09-27 ENCOUNTER — Other Ambulatory Visit: Payer: Self-pay | Admitting: Internal Medicine

## 2022-09-27 NOTE — Patient Instructions (Signed)
Visit Information  Thank you for taking time to visit with me today. Please don't hesitate to contact me if I can be of assistance to you.   Following are the goals we discussed today:   Goals Addressed             This Visit's Progress    Obtain Supportive Resources to strengthen independence   On track    Care Coordination Interventions: Active listening / Reflection utilized  Emotional Support Provided Caregiver stress acknowledged  Verbalization of feelings encouraged  Patient spoke with pts' daughter, [name] Surgery went great. Has a post surgery visit 09/26/22 Patient continues to obtain B12 shots Family continues to visit patient noting he is still residing independently. He is "doing pretty good" Patient enjoyed spending time with family for the holidays and continues to remain social with friend/neighbor LCSW assessed pt's mental health symptoms. Daughter shared pt's reality becomes blended, at times. Validation and encouragement provided. Family will continue to track and monitor behavior changes. No report of immediate or imminent safety risk Pt is not interested in home care or family assistance with ADLs. Family will review resources provided regarding home care agencies and inquire about services, when appropriate           Our next appointment is by telephone on 11/27/22 at 10 AM  Please call the care guide team at [phone] if you need to cancel or reschedule your appointment.   If you are experiencing a Mental Health or [place] or need someone to talk to, please call the Suicide and Crisis Lifeline: 988 call 911   Patient verbalizes understanding of instructions and care plan provided today and agrees to view in [place]. Active MyChart status and patient understanding of how to access instructions and care plan via MyChart confirmed with patient.     [name], MSW, [email or web address] Phone [phone] 11:08 PM

## 2022-09-27 NOTE — Patient Outreach (Signed)
  Care Coordination   Follow Up Visit Note   09/27/2022 Name: Muadh Creasy MRN: [id number] DOB: [birth year]  Delonte Musich is a 81 y.o. year old male who sees Isaac Bliss, Rayford Halsted, MD for primary care. I spoke with  Marcia Brash adult daughter, [name], by phone today.  What matters to the patients health and wellness today?  Strengthening support    Goals Addressed             This Visit's Progress    Obtain Supportive Resources to strengthen independence   On track    Care Coordination Interventions: Active listening / Reflection utilized  Emotional Support Provided Caregiver stress acknowledged  Verbalization of feelings encouraged  Patient spoke with pts' daughter, [name] Surgery went great. Has a post surgery visit 09/26/22 Patient continues to obtain B12 shots Family continues to visit patient noting he is still residing independently. He is "doing pretty good" Patient enjoyed spending time with family for the holidays and continues to remain social with friend/neighbor LCSW assessed pt's mental health symptoms. Daughter shared pt's reality becomes blended, at times. Validation and encouragement provided. Family will continue to track and monitor behavior changes. No report of immediate or imminent safety risk Pt is not interested in home care or family assistance with ADLs. Family will review resources provided regarding home care agencies and inquire about services, when appropriate           SDOH assessments and interventions completed:  No     Care Coordination Interventions:  Yes, provided   Follow up plan: Follow up call scheduled for 6-8 weeks    Encounter Outcome:  Pt. Visit Completed   [name], MSW, [email or web address] Phone [phone] 11:07 PM

## 2022-10-05 ENCOUNTER — Other Ambulatory Visit: Payer: Self-pay | Admitting: Internal Medicine

## 2022-10-05 DIAGNOSIS — F321 Major depressive disorder, single episode, moderate: Secondary | ICD-10-CM

## 2022-10-09 ENCOUNTER — Ambulatory Visit (INDEPENDENT_AMBULATORY_CARE_PROVIDER_SITE_OTHER): Payer: Medicare Other

## 2022-10-09 DIAGNOSIS — E538 Deficiency of other specified B group vitamins: Secondary | ICD-10-CM | POA: Diagnosis not present

## 2022-10-09 MED ORDER — CYANOCOBALAMIN 1000 MCG/ML IJ SOLN
1000.0000 ug | INTRAMUSCULAR | Status: AC
  Administered 2022-10-09 – 2023-02-26 (×2): 1000 ug via INTRAMUSCULAR

## 2022-10-09 NOTE — Progress Notes (Signed)
Pt here for monthly B12 injection per Dr Jerilee Hoh.  B12 1027mg given IM right deltoid and pt tolerated injection well.

## 2022-10-31 ENCOUNTER — Ambulatory Visit: Payer: Medicare Other | Admitting: Neurology

## 2022-11-04 ENCOUNTER — Other Ambulatory Visit: Payer: Self-pay | Admitting: Internal Medicine

## 2022-11-04 DIAGNOSIS — F321 Major depressive disorder, single episode, moderate: Secondary | ICD-10-CM
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
## 2022-11-07 ENCOUNTER — Other Ambulatory Visit: Payer: Medicare Other

## 2022-11-07 ENCOUNTER — Encounter: Payer: Self-pay | Admitting: Neurology

## 2022-11-07 ENCOUNTER — Ambulatory Visit: Payer: Medicare Other | Admitting: Neurology

## 2022-11-07 VITALS — BP 98/50 | HR 60 | Ht 70.0 in | Wt 164.6 lb

## 2022-11-07 DIAGNOSIS — F1097 Alcohol use, unspecified with alcohol-induced persisting dementia: Secondary | ICD-10-CM

## 2022-11-07 DIAGNOSIS — I680 Cerebral amyloid angiopathy: Secondary | ICD-10-CM

## 2022-11-07 DIAGNOSIS — I959 Hypotension, unspecified: Secondary | ICD-10-CM | POA: Diagnosis not present

## 2022-11-07 DIAGNOSIS — F01B2 Vascular dementia, moderate, with psychotic disturbance: Secondary | ICD-10-CM | POA: Diagnosis not present

## 2022-11-07 DIAGNOSIS — F0390 Unspecified dementia without behavioral disturbance: Secondary | ICD-10-CM

## 2022-11-07 DIAGNOSIS — E559 Vitamin D deficiency, unspecified: Secondary | ICD-10-CM

## 2022-11-07 NOTE — Patient Instructions (Addendum)
Continue donepezil '5mg'$  at bedtime and memantine '10mg'$  twice daily Check B1 Follow up in 9 months Follow up with PCP or cardiology regarding low blood pressure.

## 2022-11-12 ENCOUNTER — Ambulatory Visit (INDEPENDENT_AMBULATORY_CARE_PROVIDER_SITE_OTHER): Payer: Medicare Other | Admitting: Internal Medicine

## 2022-11-12 VITALS — BP 117/84 | HR 57 | Temp 97.8°F | Wt 164.4 lb

## 2022-11-12 DIAGNOSIS — R42 Dizziness and giddiness: Secondary | ICD-10-CM | POA: Diagnosis not present

## 2022-11-12 DIAGNOSIS — I10 Essential (primary) hypertension: Secondary | ICD-10-CM | POA: Diagnosis not present

## 2022-11-12 LAB — VITAMIN B1: Vitamin B1 (Thiamine): 8 nmol/L (ref 8–30)

## 2022-11-12 MED ORDER — METOPROLOL TARTRATE 50 MG PO TABS: 25.0000 mg | ORAL_TABLET | Freq: Two times a day (BID) | ORAL | 0 refills | Status: DC

## 2022-11-12 NOTE — Progress Notes (Signed)
Established Patient Office Visit     CC/Reason for Visit: Low blood pressure  HPI: Marcus Galloway is a 82 y.o. male who is coming in today for the above mentioned reasons.  He has a history of hypertension and major neurocognitive disorder that we believe is related to alcohol usage and amyloid angiopathy.  He also has a history of atrial fibrillation, he is no longer on anticoagulation due to fall risk.  He has been experiencing dizziness.  He recently saw his neurologist, thiamine levels are pending.  He was noted to have a blood pressure of 98/50 during that visit and was advised to follow-up with me.  His only antihypertensive at this point is metoprolol 50 mg that he takes twice daily.   Past Medical/Surgical History: Past Medical History:  Diagnosis Date   Aortic valve disease    Atrial fibrillation (HCC)    BPH (benign prostatic hyperplasia)    Chronic neck pain    Complication of anesthesia    passed during surgery - valve replacement 2013 at [facility]   Dementia [facility])    GERD (gastroesophageal reflux disease)    History of colonic polyps    History of heavy alcohol consumption    Hyperlipidemia    Hypertension    Osteoarthritis    Persistent atrial fibrillation ([place])    Polio    Prostate cancer [facility])     Past Surgical History:  Procedure Laterality Date   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   CARDIOVERSION  09/2016   HERNIA REPAIR  1990   INGUINAL HERNIA REPAIR Right 08/27/2022   Procedure: OPEN RIGHT INGUINAL HERNIA REPAIR WITH MESH;  Surgeon: Jovita Kussmaul, MD;  Location: [place];  Service: General;  Laterality: Right;   INSERTION OF MESH Right 08/27/2022   Procedure: INSERTION OF MESH;  Surgeon: Jovita Kussmaul, MD;  Location: [place];  Service: General;  Laterality: Right;   [place]  2002    Social History:  reports that he quit smoking about 41 years ago. His smoking use included cigarettes. He has a 90.00  pack-year smoking history. He has never used smokeless tobacco. He reports current alcohol use of about 39.0 standard drinks of alcohol per week. He reports that he does not use drugs.  Allergies: No Known Allergies  Family History:  Family History  Adopted: Yes  Family history unknown: Yes     Current Outpatient Medications:    aspirin EC 81 MG tablet, Take 1 tablet (81 mg total) by mouth daily. Swallow whole., Disp: 90 tablet, Rfl: 3   atorvastatin (LIPITOR) 10 MG tablet, TAKE 1 TABLET BY MOUTH DAILY, Disp: 90 tablet, Rfl: 1   docusate sodium (COLACE) 100 MG capsule, Take 100 mg by mouth 2 (two) times daily as needed for mild constipation., Disp: , Rfl:    donepezil (ARICEPT) 5 MG tablet, TAKE 1 TABLET BY MOUTH AT BEDTIME, Disp: 30 tablet, Rfl: 2   memantine (NAMENDA) 10 MG tablet, TAKE ONE TABLET BY MOUTH TWICE A DAY, Disp: 60 tablet, Rfl: 3   ondansetron (ZOFRAN) 4 MG tablet, Take 1 tablet (4 mg total) by mouth every 8 (eight) hours as needed for nausea or vomiting., Disp: 20 tablet, Rfl: 0   sertraline (ZOLOFT) 25 MG tablet, TAKE 1 TABLET BY MOUTH DAILY - NEED OFFICE VISIT FOR MORE REFILLS, Disp: 30 tablet, Rfl: 0   tamsulosin (FLOMAX) 0.4 MG CAPS capsule, TAKE ONE CAPSULE BY MOUTH DAILY (  Patient taking differently: Take 0.4 mg by mouth daily.), Disp: 90 capsule, Rfl: 1   metoprolol tartrate (LOPRESSOR) 50 MG tablet, Take 0.5 tablets (25 mg total) by mouth 2 (two) times daily., Disp: 180 tablet, Rfl: 0  Current Facility-Administered Medications:    cyanocobalamin (VITAMIN B12) injection 1,000 mcg, 1,000 mcg, Intramuscular, Q30 days, Isaac Bliss, Rayford Halsted, MD, 1,000 mcg at 10/09/22 1423  Review of Systems:  Constitutional: Denies fever, chills, diaphoresis, appetite change and fatigue.  HEENT: Denies photophobia, eye pain, redness, hearing loss, ear pain, congestion, sore throat, rhinorrhea, sneezing, mouth sores, trouble swallowing, neck pain, neck stiffness and tinnitus.    Respiratory: Denies SOB, DOE, cough, chest tightness,  and wheezing.   Cardiovascular: Denies chest pain, palpitations and leg swelling.  Gastrointestinal: Denies nausea, vomiting, abdominal pain, diarrhea, constipation, blood in stool and abdominal distention.  Genitourinary: Denies dysuria, urgency, frequency, hematuria, flank pain and difficulty urinating.  Endocrine: Denies: hot or cold intolerance, sweats, changes in hair or nails, polyuria, polydipsia. Musculoskeletal: Denies myalgias, back pain, joint swelling, arthralgias and gait problem.  Skin: Denies pallor, rash and wound.  Neurological: Denies seizures, syncope, weakness, light-headedness, numbness and headaches.  Hematological: Denies adenopathy. Easy bruising, personal or family bleeding history  Psychiatric/Behavioral: Denies suicidal ideation, mood changes, confusion, nervousness, sleep disturbance and agitation    Physical Exam: Vitals:   11/12/22 1128 11/12/22 1133  BP: 120/70 117/84  Pulse: (!) 57   Temp: 97.8 F (36.6 C)   TempSrc: Oral   SpO2: 99%   Weight: 164 lb 6.4 oz (74.6 kg)     Body mass index is 23.59 kg/m.   Constitutional: NAD, calm, comfortable Eyes: PERRL, lids and conjunctivae normal, wears corrective lenses ENMT: Mucous membranes are moist.  Respiratory: clear to auscultation bilaterally, no wheezing, no crackles. Normal respiratory effort. No accessory muscle use.  Cardiovascular: Regular rate and rhythm, no murmurs / rubs / gallops. No extremity edema.    Impression and Plan:  Dizziness  Primary hypertension  -While in office blood pressure today is normal, I can see several recent measurements that have been in the 38B systolic. -Decrease metoprolol from 50 mg twice a day to 25 mg twice a day.  Time spent:31 minutes reviewing chart, interviewing and examining patient and formulating plan of care.       Lelon Frohlich, MD [facility]

## 2022-11-16 ENCOUNTER — Other Ambulatory Visit: Payer: Self-pay | Admitting: Internal Medicine

## 2022-11-20 ENCOUNTER — Ambulatory Visit (INDEPENDENT_AMBULATORY_CARE_PROVIDER_SITE_OTHER): Payer: Medicare Other | Admitting: *Deleted

## 2022-11-20 DIAGNOSIS — E538 Deficiency of other specified B group vitamins: Secondary | ICD-10-CM | POA: Diagnosis not present

## 2022-11-20 MED ORDER — CYANOCOBALAMIN 1000 MCG/ML IJ SOLN
1000.0000 ug | Freq: Once | INTRAMUSCULAR | Status: AC
  Administered 2022-11-20: 1000 ug via INTRAMUSCULAR

## 2022-11-20 NOTE — Progress Notes (Signed)
Per orders of Dr. Hernandez, injection of B12 given by Arthor Gorter. Patient tolerated injection well.  

## 2022-11-21 MED ORDER — CYANOCOBALAMIN 1000 MCG/ML IJ SOLN
1000.0000 ug | Freq: Once | INTRAMUSCULAR | Status: AC
  Administered 2022-11-20: 1000 ug via INTRAMUSCULAR

## 2022-11-21 NOTE — Addendum Note (Signed)
Addended by: Westley Hummer B on: 11/21/2022 05:00 PM   Modules accepted: Orders

## 2022-11-27 ENCOUNTER — Ambulatory Visit: Payer: Self-pay | Admitting: Licensed Clinical Social Worker

## 2022-11-30 NOTE — Patient Instructions (Signed)
Visit Information  Thank you for taking time to visit with me today. Please don't hesitate to contact me if I can be of assistance to you.   Following are the goals we discussed today:   Goals Addressed             This Visit's Progress    Obtain Supportive Resources to strengthen independence   On track    Care Coordination Interventions: Active listening / Reflection utilized  Emotional Support Provided Caregiver stress acknowledged  Verbalization of feelings encouraged  Patient spoke with pts' daughter, [name] Surgery went great. Has a post surgery visit 09/26/22 Patient continues to obtain B12 shots Family continues to visit patient noting he is still residing independently. He is "doing pretty good" Patient enjoyed spending time with family for the holidays and continues to remain social with friend/neighbor LCSW assessed pt's mental health symptoms. Daughter shared pt's reality becomes blended, at times. Validation and encouragement provided. Family will continue to track and monitor behavior changes. No report of immediate or imminent safety risk Pt is not interested in home care or family assistance with ADLs. Family will review resources provided regarding home care agencies and inquire about services, when appropriate           If you are experiencing a Mental Health or [place] or need someone to talk to, please call the Suicide and Crisis Lifeline: 988 call 911   Patient verbalizes understanding of instructions and care plan provided today and agrees to view in [place]. Active MyChart status and patient understanding of how to access instructions and care plan via MyChart confirmed with patient.     No further follow up required:    [name], MSW, [email or web address] Phone [phone] 4:34 AM

## 2022-11-30 NOTE — Patient Outreach (Signed)
  Care Coordination Late Entry  Follow Up Visit Note   Outreach completed 11/27/22 Name: Marcus Galloway MRN: [id number] DOB: 1941/09/17  Marcus Galloway is a 82 y.o. year old male who sees Isaac Bliss, Rayford Halsted, MD for primary care. I spoke with  Marcia Brash daughter by phone today.  What matters to the patients health and wellness today?  Care Coordination    Goals Addressed             This Visit's Progress    Obtain Supportive Resources to strengthen independence   On track    Care Coordination Interventions: Active listening / Reflection utilized  Emotional Support Provided Caregiver stress acknowledged  Verbalization of feelings encouraged  Patient spoke with pts' daughter, [name] Surgery went great. Has a post surgery visit 09/26/22 Patient continues to obtain B12 shots Family continues to visit patient noting he is still residing independently. He is "doing pretty good" Patient enjoyed spending time with family for the holidays and continues to remain social with friend/neighbor LCSW assessed pt's mental health symptoms. Daughter shared pt's reality becomes blended, at times. Validation and encouragement provided. Family will continue to track and monitor behavior changes. No report of immediate or imminent safety risk Pt is not interested in home care or family assistance with ADLs. Family will review resources provided regarding home care agencies and inquire about services, when appropriate           SDOH assessments and interventions completed:  No     Care Coordination Interventions:  Yes, provided   Follow up plan: No further intervention required.   Encounter Outcome:  Pt. Visit Completed   [name], MSW, [email or web address] Phone [phone] 4:33 AM

## 2022-12-03 ENCOUNTER — Other Ambulatory Visit: Payer: Self-pay | Admitting: Internal Medicine

## 2022-12-03 DIAGNOSIS — F321 Major depressive disorder, single episode, moderate: Secondary | ICD-10-CM

## 2022-12-17 ENCOUNTER — Other Ambulatory Visit: Payer: Self-pay | Admitting: Neurology

## 2022-12-23 ENCOUNTER — Other Ambulatory Visit: Payer: Self-pay | Admitting: Neurology

## 2022-12-25 ENCOUNTER — Ambulatory Visit (INDEPENDENT_AMBULATORY_CARE_PROVIDER_SITE_OTHER): Payer: Medicare Other

## 2022-12-25 DIAGNOSIS — E538 Deficiency of other specified B group vitamins: Secondary | ICD-10-CM

## 2022-12-25 MED ORDER — CYANOCOBALAMIN 1000 MCG/ML IJ SOLN
1000.0000 ug | Freq: Once | INTRAMUSCULAR | Status: AC
  Administered 2022-12-25: 1000 ug via INTRAMUSCULAR

## 2022-12-25 NOTE — Progress Notes (Signed)
Per orders of Dr. Jerilee Hoh, injection of Cyanocobalamin 1000 mcg given by Zineb Glade L Iseah Plouff. Patient tolerated injection well.

## 2022-12-27 ENCOUNTER — Other Ambulatory Visit: Payer: Self-pay | Admitting: Internal Medicine

## 2023-01-23 ENCOUNTER — Encounter: Payer: Self-pay | Admitting: Adult Health

## 2023-01-23 ENCOUNTER — Ambulatory Visit (INDEPENDENT_AMBULATORY_CARE_PROVIDER_SITE_OTHER): Payer: Medicare Other

## 2023-01-23 ENCOUNTER — Ambulatory Visit (INDEPENDENT_AMBULATORY_CARE_PROVIDER_SITE_OTHER): Payer: Medicare Other | Admitting: Adult Health

## 2023-01-23 VITALS — BP 110/78 | HR 68 | Temp 97.6°F | Ht 70.0 in | Wt 163.0 lb

## 2023-01-23 DIAGNOSIS — R3129 Other microscopic hematuria: Secondary | ICD-10-CM | POA: Diagnosis not present

## 2023-01-23 DIAGNOSIS — R300 Dysuria: Secondary | ICD-10-CM

## 2023-01-23 DIAGNOSIS — R918 Other nonspecific abnormal finding of lung field: Secondary | ICD-10-CM | POA: Diagnosis not present

## 2023-01-23 LAB — POCT URINALYSIS DIPSTICK
Bilirubin, UA: NEGATIVE
Blood, UA: POSITIVE
Glucose, UA: NEGATIVE
Ketones, UA: POSITIVE
Leukocytes, UA: NEGATIVE
Nitrite, UA: NEGATIVE
Protein, UA: POSITIVE — AB
Spec Grav, UA: >=1.03 — AB (ref 1.010–1.025)
Urobilinogen, UA: 1 E.U./dL
pH, UA: 5.5 (ref 5.0–8.0)

## 2023-01-23 NOTE — Progress Notes (Signed)
Subjective:    Patient ID: Marcus Galloway, male    DOB: June 28, 1941, 82 y.o.   MRN: [id number]  Hematuria    82 year old male who  has a past medical history of Aortic valve disease, Atrial fibrillation ([place]), BPH (benign prostatic hyperplasia), Chronic neck pain, Complication of anesthesia, Dementia ([place]), GERD (gastroesophageal reflux disease), History of colonic polyps, History of heavy alcohol consumption, Hyperlipidemia, Hypertension, Osteoarthritis, Persistent atrial fibrillation ([place]), Polio, and Prostate cancer ([place]).  He presents to the office today for an acute issue.   He reports that for the last week or so he has been experiencing a burning pain when urinating. He reports that there has also been a red and pus like discharge found in his underwear.   He denies fevers, chills, urgency or frequency.   He does have a remote history of kidney stones     Review of Systems  Genitourinary:  Positive for hematuria.   See HPI   Past Medical History:  Diagnosis Date   Aortic valve disease    Atrial fibrillation (HCC)    BPH (benign prostatic hyperplasia)    Chronic neck pain    Complication of anesthesia    passed during surgery - valve replacement 2013 at [facility]   Dementia [facility])    GERD (gastroesophageal reflux disease)    History of colonic polyps    History of heavy alcohol consumption    Hyperlipidemia    Hypertension    Osteoarthritis    Persistent atrial fibrillation (HCC)    Polio    Prostate cancer [facility])     Social History   Socioeconomic History   Marital status: Widowed    Spouse name: Not on file   Number of children: 4   Years of education: 43   Highest education level: 12th grade  Occupational History   Occupation: Retired  Tobacco Use   Smoking status: Former    Packs/day: 3.00    Years: 30.00    Additional pack years: 0.00    Total pack years: 90.00    Types: Cigarettes    Quit date: 1983    Years since quitting: 41.2   Smokeless  tobacco: Never   Tobacco comments:    quit smoking 1980  Vaping Use   Vaping Use: Never used  Substance and Sexual Activity   Alcohol use: Yes    Alcohol/week: 39.0 standard drinks of alcohol    Types: 21 Glasses of wine, 6 Cans of beer, 12 Shots of liquor per week    Comment: heavy drinker   Drug use: No   Sexual activity: Not Currently  Other Topics Concern   Not on file  Social History Narrative   Lives alone, recently moved back to [place] from [facility] to be closer to his children   Fist floor of apartment complex   Right-handed   Caffeine: 3 cups each morning   Social Determinants of Health   Financial Resource Strain: Low Risk  (11/12/2022)   Overall Financial Resource Strain (CARDIA)    Difficulty of Paying Living Expenses: Not hard at all  Food Insecurity: No Food Insecurity (11/12/2022)   Hunger Vital Sign    Worried About Running Out of Food in the Last Year: Never true    Ran Out of Food in the Last Year: Never true  Transportation Needs: No Transportation Needs (11/12/2022)   PRAPARE - Hydrologist (Medical): No    Lack of Transportation (Non-Medical): No  Physical  Activity: Unknown (11/12/2022)   Exercise Vital Sign    Days of Exercise per Week: Patient declined    Minutes of Exercise per Session: Not on file  Stress: Patient Declined (11/12/2022)   [place]    Feeling of Stress : Patient declined  Social Connections: Unknown (11/12/2022)   Social Connection and Isolation Panel [NHANES]    Frequency of Communication with Friends and Family: Patient declined    Frequency of Social Gatherings with Friends and Family: Patient declined    Attends Religious Services: Never    Marine scientist or Organizations: No    Attends Music therapist: Not on file    Marital Status: Widowed  Intimate Partner Violence: Not on file    Past Surgical History:  Procedure  Laterality Date   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   CARDIOVERSION  09/2016   HERNIA REPAIR  1990   INGUINAL HERNIA REPAIR Right 08/27/2022   Procedure: OPEN RIGHT INGUINAL HERNIA REPAIR WITH MESH;  Surgeon: Jovita Kussmaul, MD;  Location: [place];  Service: General;  Laterality: Right;   INSERTION OF MESH Right 08/27/2022   Procedure: INSERTION OF MESH;  Surgeon: Jovita Kussmaul, MD;  Location: [place];  Service: General;  Laterality: Right;   INSERTION PROSTATE RADIATION SEED  2002    Family History  Adopted: Yes  Family history unknown: Yes    No Known Allergies  Current Outpatient Medications on File Prior to Visit  Medication Sig Dispense Refill   aspirin EC 81 MG tablet Take 1 tablet (81 mg total) by mouth daily. Swallow whole. 90 tablet 3   atorvastatin (LIPITOR) 10 MG tablet TAKE 1 TABLET BY MOUTH DAILY 90 tablet 1   docusate sodium (COLACE) 100 MG capsule Take 100 mg by mouth 2 (two) times daily as needed for mild constipation.     donepezil (ARICEPT) 5 MG tablet TAKE 1 TABLET BY MOUTH AT BEDTIME 90 tablet 2   memantine (NAMENDA) 10 MG tablet TAKE 1 TABLET BY MOUTH TWICE A DAY 60 tablet 3   metoprolol tartrate (LOPRESSOR) 50 MG tablet TAKE 1 TABLET BY MOUTH TWICE A DAY 180 tablet 0   ondansetron (ZOFRAN) 4 MG tablet Take 1 tablet (4 mg total) by mouth every 8 (eight) hours as needed for nausea or vomiting. 20 tablet 0   sertraline (ZOLOFT) 25 MG tablet TAKE 1 TABLET BY MOUTH DAILY 90 tablet 0   tamsulosin (FLOMAX) 0.4 MG CAPS capsule Take 1 capsule (0.4 mg total) by mouth daily. SCHEDULE ANNUAL VISIT FOR FUTURE REFILLS 90 capsule 0   Current Facility-Administered Medications on File Prior to Visit  Medication Dose Route Frequency Provider Last Rate Last Admin   cyanocobalamin (VITAMIN B12) injection 1,000 mcg  1,000 mcg Intramuscular Q30 days Isaac Bliss, Rayford Halsted, MD   1,000 mcg at 10/09/22 1423    BP 110/78   Pulse 68   Temp 97.6 F (36.4 C)  (Oral)   Ht 5\' 10"  (1.778 m)   Wt 163 lb (73.9 kg)   SpO2 98%   BMI 23.39 kg/m       Objective:   Physical Exam Vitals and nursing note reviewed.  Cardiovascular:     Rate and Rhythm: Normal rate and regular rhythm.     Pulses: Normal pulses.     Heart sounds: Normal heart sounds.  Pulmonary:     Effort: Pulmonary effort is normal.  Breath sounds: Normal breath sounds.  Abdominal:     General: Abdomen is flat.     Palpations: Abdomen is soft.  Musculoskeletal:        General: Normal range of motion.  Skin:    General: Skin is warm and dry.     Capillary Refill: Capillary refill takes less than 2 seconds.  Neurological:     General: No focal deficit present.     Mental Status: He is oriented to person, place, and time.  Psychiatric:        Mood and Affect: Mood normal.        Behavior: Behavior normal.        Thought Content: Thought content normal.        Assessment & Plan:  1. Other microscopic hematuria  - DG Abd 1 View; Future - Basic Metabolic Panel; Future - CBC with Differential/Platelet; Future - CBC with Differential/Platelet - Basic Metabolic Panel - POC Urinalysis Dipstick _ protein, blood, and ketones. His urine was quite concentrated. - Will check KUB for kidney stones. Consider further imaging. Encouraged to drink more water  - Culture, Urine; Future - Culture, Urine  2. Dysuria  - Culture, Urine; Future - Culture, Urine   Dorothyann Peng, NP

## 2023-01-24 LAB — BASIC METABOLIC PANEL
BUN: 20 mg/dL (ref 6–23)
CO2: 27 mEq/L (ref 19–32)
Calcium: 9.4 mg/dL (ref 8.4–10.5)
Chloride: 101 mEq/L (ref 96–112)
Creatinine, Ser: 0.8 mg/dL (ref 0.40–1.50)
GFR: 82.32 mL/min (ref >60.00)
Glucose, Bld: 89 mg/dL (ref 70–99)
Potassium: 4.3 mEq/L (ref 3.5–5.1)
Sodium: 136 mEq/L (ref 135–145)

## 2023-01-24 LAB — URINE CULTURE
MICRO NUMBER:: 14749260
Result:: NO GROWTH
SPECIMEN QUALITY:: ADEQUATE

## 2023-01-24 LAB — CBC WITH DIFFERENTIAL/PLATELET
Basophils Absolute: 0.1 10*3/uL (ref 0.0–0.1)
Basophils Relative: 0.8 % (ref 0.0–3.0)
Eosinophils Absolute: 0 10*3/uL (ref 0.0–0.7)
Eosinophils Relative: 0.5 % (ref 0.0–5.0)
HCT: 43.7 % (ref 39.0–52.0)
Hemoglobin: 14.7 g/dL (ref 13.0–17.0)
Lymphocytes Relative: 21 % (ref 12.0–46.0)
Lymphs Abs: 1.4 10*3/uL (ref 0.7–4.0)
MCHC: 33.7 g/dL (ref 30.0–36.0)
MCV: 97.6 fl (ref 78.0–100.0)
Monocytes Absolute: 0.5 10*3/uL (ref 0.1–1.0)
Monocytes Relative: 8.3 % (ref 3.0–12.0)
Neutro Abs: 4.6 10*3/uL (ref 1.4–7.7)
Neutrophils Relative %: 69.4 % (ref 43.0–77.0)
Platelets: 198 10*3/uL (ref 150.0–400.0)
RBC: 4.48 Mil/uL (ref 4.22–5.81)
RDW: 13.7 % (ref 11.5–15.5)
WBC: 6.6 10*3/uL (ref 4.0–10.5)

## 2023-01-29 ENCOUNTER — Ambulatory Visit (INDEPENDENT_AMBULATORY_CARE_PROVIDER_SITE_OTHER): Payer: Medicare Other | Admitting: *Deleted

## 2023-01-29 ENCOUNTER — Ambulatory Visit: Payer: Medicare Other

## 2023-01-29 ENCOUNTER — Other Ambulatory Visit: Payer: Self-pay | Admitting: Adult Health

## 2023-01-29 DIAGNOSIS — E538 Deficiency of other specified B group vitamins: Secondary | ICD-10-CM

## 2023-01-29 DIAGNOSIS — R3129 Other microscopic hematuria: Secondary | ICD-10-CM

## 2023-01-29 MED ORDER — CYANOCOBALAMIN 1000 MCG/ML IJ SOLN
1000.0000 ug | Freq: Once | INTRAMUSCULAR | Status: AC
  Administered 2023-01-29: 1000 ug via INTRAMUSCULAR

## 2023-01-29 NOTE — Progress Notes (Signed)
Per orders of Dr. Hernandez, injection of B12 given by Tameko Halder. Patient tolerated injection well.  

## 2023-02-14 ENCOUNTER — Other Ambulatory Visit: Payer: Self-pay | Admitting: Internal Medicine

## 2023-02-14 DIAGNOSIS — E781 Pure hyperglyceridemia: Secondary | ICD-10-CM

## 2023-02-26 ENCOUNTER — Ambulatory Visit (INDEPENDENT_AMBULATORY_CARE_PROVIDER_SITE_OTHER): Payer: Medicare Other

## 2023-02-26 ENCOUNTER — Telehealth: Payer: Self-pay | Admitting: Internal Medicine

## 2023-02-26 DIAGNOSIS — E538 Deficiency of other specified B group vitamins: Secondary | ICD-10-CM | POA: Diagnosis not present

## 2023-02-26 MED ORDER — CYANOCOBALAMIN 1000 MCG/ML IJ SOLN: 1000.0000 ug | Freq: Once | INTRAMUSCULAR | 0 refills | Status: AC

## 2023-02-26 NOTE — Progress Notes (Signed)
Pt here for monthly B12 injection per Dr Ardyth Harps  B12 given IM and pt tolerated injection well.

## 2023-02-26 NOTE — Telephone Encounter (Signed)
Daughter calling wondering why there was a prescription for cyanocobalamin (VITAMIN B12) 1000 MCG/ML injection. Says father cannot administer this shot to himself and would like for him to continue to come in office for his B-12

## 2023-02-27 NOTE — Telephone Encounter (Signed)
Okay for patient to continue B12 in the office. Daughter is aware.

## 2023-02-28 ENCOUNTER — Other Ambulatory Visit: Payer: Self-pay | Admitting: Internal Medicine

## 2023-02-28 DIAGNOSIS — F321 Major depressive disorder, single episode, moderate: Secondary | ICD-10-CM

## 2023-03-26 ENCOUNTER — Ambulatory Visit (INDEPENDENT_AMBULATORY_CARE_PROVIDER_SITE_OTHER): Payer: Medicare Other

## 2023-03-26 DIAGNOSIS — E538 Deficiency of other specified B group vitamins: Secondary | ICD-10-CM

## 2023-03-26 MED ORDER — CYANOCOBALAMIN 1000 MCG/ML IJ SOLN
1000.0000 ug | Freq: Once | INTRAMUSCULAR | Status: AC
  Administered 2023-03-26: 1000 ug via INTRAMUSCULAR

## 2023-03-26 NOTE — Progress Notes (Signed)
Pt here for monthly B12 injection per Dr Ardyth Harps  B12 given IM and pt tolerated injection well.  Next B12 injection pt daughter will call to schedule pt reported

## 2023-03-28 ENCOUNTER — Other Ambulatory Visit: Payer: Self-pay | Admitting: Internal Medicine

## 2023-04-02 ENCOUNTER — Encounter: Payer: Self-pay | Admitting: Internal Medicine

## 2023-04-03 ENCOUNTER — Other Ambulatory Visit: Payer: Self-pay | Admitting: Internal Medicine

## 2023-04-03 DIAGNOSIS — I10 Essential (primary) hypertension: Secondary | ICD-10-CM

## 2023-04-03 MED ORDER — METOPROLOL TARTRATE 25 MG PO TABS: 25.0000 mg | ORAL_TABLET | Freq: Two times a day (BID) | ORAL | 0 refills | Status: DC

## 2023-04-03 MED ORDER — METOPROLOL TARTRATE 50 MG PO TABS: 25.0000 mg | ORAL_TABLET | Freq: Two times a day (BID) | ORAL | 0 refills | Status: DC

## 2023-04-21 ENCOUNTER — Other Ambulatory Visit: Payer: Self-pay | Admitting: Neurology

## 2023-04-30 ENCOUNTER — Ambulatory Visit (INDEPENDENT_AMBULATORY_CARE_PROVIDER_SITE_OTHER): Payer: Medicare Other

## 2023-04-30 DIAGNOSIS — E538 Deficiency of other specified B group vitamins: Secondary | ICD-10-CM | POA: Diagnosis not present

## 2023-04-30 MED ORDER — CYANOCOBALAMIN 1000 MCG/ML IJ SOLN
1000.0000 ug | Freq: Once | INTRAMUSCULAR | Status: AC
  Administered 2023-04-30: 1000 ug via INTRAMUSCULAR

## 2023-04-30 NOTE — Progress Notes (Signed)
Pt here for monthly B12 injection per Dr Ardyth Harps  B12 given IM and pt tolerated injection well.  Next B12 injection scheduled for one month.

## 2023-05-16 ENCOUNTER — Other Ambulatory Visit: Payer: Self-pay | Admitting: Internal Medicine

## 2023-05-16 DIAGNOSIS — E781 Pure hyperglyceridemia: Secondary | ICD-10-CM

## 2023-05-30 ENCOUNTER — Other Ambulatory Visit: Payer: Self-pay | Admitting: Internal Medicine

## 2023-05-30 DIAGNOSIS — F321 Major depressive disorder, single episode, moderate: Secondary | ICD-10-CM

## 2023-06-04 ENCOUNTER — Ambulatory Visit (INDEPENDENT_AMBULATORY_CARE_PROVIDER_SITE_OTHER): Payer: Medicare Other | Admitting: *Deleted

## 2023-06-04 DIAGNOSIS — E538 Deficiency of other specified B group vitamins: Secondary | ICD-10-CM | POA: Diagnosis not present

## 2023-06-04 MED ORDER — CYANOCOBALAMIN 1000 MCG/ML IJ SOLN
1000.0000 ug | Freq: Once | INTRAMUSCULAR | Status: AC
  Administered 2023-06-04: 1000 ug via INTRAMUSCULAR

## 2023-06-04 NOTE — Progress Notes (Signed)
Per orders of Dr. Jack Quarto, injection of B12 given by Stann Ore. Patient tolerated injection well.

## 2023-06-05 ENCOUNTER — Encounter: Payer: Self-pay | Admitting: Physician Assistant

## 2023-06-05 ENCOUNTER — Ambulatory Visit: Payer: Medicare Other | Attending: Physician Assistant | Admitting: Physician Assistant

## 2023-06-05 VITALS — BP 104/66 | HR 58 | Ht 70.0 in | Wt 159.2 lb

## 2023-06-05 DIAGNOSIS — F1010 Alcohol abuse, uncomplicated: Secondary | ICD-10-CM

## 2023-06-05 DIAGNOSIS — I4821 Permanent atrial fibrillation: Secondary | ICD-10-CM

## 2023-06-05 DIAGNOSIS — I10 Essential (primary) hypertension: Secondary | ICD-10-CM | POA: Diagnosis not present

## 2023-06-05 DIAGNOSIS — E785 Hyperlipidemia, unspecified: Secondary | ICD-10-CM | POA: Diagnosis not present

## 2023-06-05 DIAGNOSIS — R42 Dizziness and giddiness: Secondary | ICD-10-CM

## 2023-06-05 DIAGNOSIS — Z952 Presence of prosthetic heart valve: Secondary | ICD-10-CM | POA: Diagnosis not present

## 2023-06-05 NOTE — Patient Instructions (Signed)
Medication Instructions:  Continue taking aspirin daily. *If you need a refill on your cardiac medications before your next appointment, please call your pharmacy*   Lab Work: No labs If you have labs (blood work) drawn today and your tests are completely normal, you will receive your results only by: MyChart Message (if you have MyChart) OR A paper copy in the mail If you have any lab test that is abnormal or we need to change your treatment, we will call you to review the results.   Testing/Procedures:[address]- IN THE NEXT 3 MONTHS Your physician has requested that you have an echocardiogram. Echocardiography is a painless test that uses sound waves to create images of your heart. It provides your doctor with information about the size and shape of your heart and how well your heart's chambers and valves are working. This procedure takes approximately one hour. There are no restrictions for this procedure. Please do NOT wear cologne, perfume, aftershave, or lotions (deodorant is allowed). Please arrive 15 minutes prior to your appointment time.    Follow-Up: At [facility], you and your health needs are our priority.  As part of our continuing mission to provide you with exceptional heart care, we have created designated Provider Care Teams.  These Care Teams include your primary Cardiologist (physician) and Advanced Practice Providers (APPs -  Physician Assistants and Nurse Practitioners) who all work together to provide you with the care you need, when you need it.   Your next appointment:   1 year(s)  Provider:   Nicki Guadalajara, MD

## 2023-06-05 NOTE — Progress Notes (Signed)
Cardiology Office Note:  .   Date:  06/05/2023  ID:  Marcus Galloway, DOB August 25, 1941, MRN [id number] PCP: Marcus Galloway, Marcus Patricia, MD  [facility] Providers Cardiologist:  Marcus Guadalajara, MD     History of Present Illness: .   Marcus Galloway is a 82 y.o. male was past medical history of bioprosthetic aortic valve replacement in 09/2012, permanent atrial fibrillation, hypertension, hyperlipidemia, polio, prostate cancer and history of dementia.  He was diagnosed with atrial fibrillation during routine office visit in November 2017.  He was started on rate control therapy and anticoagulation and underwent cardioversion.  He developed recurrent A-fib and underwent sotalol loading with repeat cardioversion.  Echocardiogram at the time showed EF 50 to 55%, biatrial enlargement, mild to moderate MR, moderate TR and normal prosthetic aortic valve.  He was previously followed by Dr. Asa Galloway in [facility].  He was last seen in February 2018 at which time he was back in A-fib.  Eliquis and sotalol were discontinued and he was started metoprolol tartrate 75 mg twice a day and Coumadin for anticoagulation therapy.  He subsequently moved to [place] area.  Cardizem was added on top of metoprolol for better rate control.  Metoprolol was reduced to 50 mg twice daily due to dizziness.  Echocardiogram performed in April 2018 showed EF 60 to 65%, normal wall motion, well-seated bioprosthetic aortic valve, severe biatrial enlargement.  Repeat echocardiogram in October 2021 showed EF 55 to 60%, LVH, 25 mm bioprosthetic aortic valve was well-seated with no aortic regurgitation, severe biatrial enlargement.  He was seen by Dr. Tresa Galloway on 04/24/2022 at which time he was having several falls.  After extensive discussion with his PCP, he ultimately came off of Coumadin.  Dr. Tresa Galloway started him on 81 mg aspirin.  Cardizem was also discontinued due to low blood pressure.  He had a virtual  visit with Marcus Person NP in October 2023 who cleared him for hernia repair.  He was recently seen by his PCP in March 2024 due to dysuria.  Patient presents today for follow-up.  He has occasional sharp shooting pain in the chest that only last 1 second and goes away immediately.  The symptom does not come on with physical activity.  We will continue to monitor, this is unlikely to be cardiac in nature.  Daughter says he has cut back on drinking.  He used to drink a bottle of wine, now he is no longer drinking wine, he does drink about 1 beer per day.  He gets dyspnea on both most during his activity but not with everyday activity.  He lives by himself and does not do much activity.  He is does not cook, he uses microwave to heat up.  Short-term memory continues to be very poor.  His primary issue is constant dizziness.  Dizziness seems to be slightly worse when he tried to get up but he says he has dizziness sitting down all the time as well.  Systolic blood pressures around 100-110s.  Metoprolol tartrate has already been cut back to 25 mg twice a day.  At least a part of his dizziness is likely related to history of drinking.  Since he has cut back on drinking, if his dizziness does not improve after 4-month, I have instructed the daughter to decrease the Toprol tartrate down to 12.5 mg twice a day.  She is to monitor his heart rate afterward to make sure his resting heart rate stays  between 50-100 at rest.  He is overdue for repeat echocardiogram to follow-up on his aortic valve replacement.  We will obtain the echocardiogram nonurgently in the next few months.  Overall, patient is doing well from the cardiac perspective and can follow-up in 1 year.  ROS:   Patient has occasional sharp shooting pain, dyspnea on more strenuous activity but not with everyday activity and a constant dizziness.  Studies Reviewed: Marland Kitchen   EKG Interpretation Date/Time:  Wednesday June 05 2023 11:21:47 EDT Ventricular Rate:   58 PR Interval:    QRS Duration:  116 QT Interval:  442 QTC Calculation: 433 R Axis:   -72  Text Interpretation: Atrial fibrillation with slow ventricular response Left anterior fascicular block  Poor R wave progression in the anterior leads Confirmed by Azalee Course [phone]) on 06/05/2023 12:32:15 PM     Risk Assessment/Calculations:    CHA2DS2-VASc Score = 3   This indicates a 3.2% annual risk of stroke. The patient's score is based upon: CHF History: 0 HTN History: 1 Diabetes History: 0 Stroke History: 0 Vascular Disease History: 0 Age Score: 2 Gender Score: 0            Physical Exam:   VS:  BP 104/66 (BP Location: Right Arm, Patient Position: Sitting, Cuff Size: Normal)   Pulse (!) 58   Ht 5\' 10"  (1.778 m)   Wt 159 lb 3.2 oz (72.2 kg)   SpO2 97%   BMI 22.84 kg/m    Wt Readings from Last 3 Encounters:  06/05/23 159 lb 3.2 oz (72.2 kg)  01/23/23 163 lb (73.9 kg)  11/12/22 164 lb 6.4 oz (74.6 kg)    GEN: Well nourished, well developed in no acute distress NECK: No JVD; No carotid bruits CARDIAC: RRR, no murmurs, rubs, gallops RESPIRATORY:  Clear to auscultation without rales, wheezing or rhonchi  ABDOMEN: Soft, non-tender, non-distended EXTREMITIES:  No edema; No deformity   ASSESSMENT AND PLAN: .    Dizziness: Patient has constant dizziness all the time, dizziness may be slightly worse with body position changes.  However a large part of his dizziness is likely related to alcohol abuse.  He has since cut back on alcoholic intake in the past week.  He is still drinking 1 beer per day with food.  He finished a sixpack in 1 week.  If cutting back does not improve the dizzy spell, daughter has been instructed to cut metoprolol tartrate down to 12.5 mg twice a day after 2 months.  She has been instructed to monitor the heart rate to make sure resting heart rate remained between 50-100 after decreasing the metoprolol dosage.  Permanent atrial fibrillation: Rate controlled on  current therapy.  No longer on anticoagulation therapy after extensive discussion with PCP.  He had high risk for fall with alcohol abuse.  He was placed on 81 mg daily aspirin by Dr. Tresa Galloway.  Patient and daughter were to monitor for any sign of stroke and call 911 if he does have symptom of stroke  History of aortic valve replacement: Will repeat echocardiogram  Hypertension: Blood pressure stable  Hyperlipidemia: On Lipitor  History of EtOH abuse: He used to drink a bottle of wine each day, recently cut back down to 1 beer per day since 2 weeks ago.       Dispo: follow up with Dr. Tresa Galloway in 1 year  Signed, Azalee Course, [place]

## 2023-06-13 ENCOUNTER — Encounter (INDEPENDENT_AMBULATORY_CARE_PROVIDER_SITE_OTHER): Payer: Self-pay

## 2023-06-28 ENCOUNTER — Encounter (HOSPITAL_COMMUNITY): Payer: Self-pay | Admitting: Radiology

## 2023-06-28 ENCOUNTER — Ambulatory Visit (HOSPITAL_COMMUNITY): Payer: Medicare Other | Attending: Physician Assistant

## 2023-06-28 DIAGNOSIS — Z952 Presence of prosthetic heart valve: Secondary | ICD-10-CM | POA: Insufficient documentation

## 2023-06-28 DIAGNOSIS — I4821 Permanent atrial fibrillation: Secondary | ICD-10-CM | POA: Diagnosis not present

## 2023-06-28 LAB — ECHOCARDIOGRAM COMPLETE
AR max vel: 1.17 cm2
AV Area VTI: 1.25 cm2
AV Area mean vel: 1.11 cm2
AV Mean grad: 28 mmHg
AV Peak grad: 47.8 mmHg
Ao pk vel: 3.46 m/s
S' Lateral: 3.6 cm

## 2023-06-28 NOTE — Progress Notes (Signed)
Patient here for echocardiogram. Patient commented that palpitations wake he up at night. He asked is this normal. Is this his atrial fibrillatio? He has no chest pain he is just startles. Would like to address.

## 2023-06-30 ENCOUNTER — Other Ambulatory Visit: Payer: Self-pay | Admitting: Internal Medicine

## 2023-06-30 DIAGNOSIS — F321 Major depressive disorder, single episode, moderate: Secondary | ICD-10-CM

## 2023-07-02 DIAGNOSIS — H25043 Posterior subcapsular polar age-related cataract, bilateral: Secondary | ICD-10-CM | POA: Diagnosis not present

## 2023-07-02 DIAGNOSIS — H2513 Age-related nuclear cataract, bilateral: Secondary | ICD-10-CM | POA: Diagnosis not present

## 2023-07-02 DIAGNOSIS — H18413 Arcus senilis, bilateral: Secondary | ICD-10-CM | POA: Diagnosis not present

## 2023-07-02 DIAGNOSIS — H35372 Puckering of macula, left eye: Secondary | ICD-10-CM | POA: Diagnosis not present

## 2023-07-02 DIAGNOSIS — H2511 Age-related nuclear cataract, right eye: Secondary | ICD-10-CM | POA: Diagnosis not present

## 2023-07-02 DIAGNOSIS — H25013 Cortical age-related cataract, bilateral: Secondary | ICD-10-CM | POA: Diagnosis not present

## 2023-07-04 ENCOUNTER — Other Ambulatory Visit: Payer: Self-pay | Admitting: Internal Medicine

## 2023-07-09 ENCOUNTER — Ambulatory Visit (INDEPENDENT_AMBULATORY_CARE_PROVIDER_SITE_OTHER): Payer: Medicare Other

## 2023-07-09 DIAGNOSIS — E538 Deficiency of other specified B group vitamins: Secondary | ICD-10-CM | POA: Diagnosis not present

## 2023-07-09 MED ORDER — CYANOCOBALAMIN 1000 MCG/ML IJ SOLN
1000.0000 ug | Freq: Once | INTRAMUSCULAR | Status: AC
Start: 2023-07-09 — End: 2023-07-09
  Administered 2023-07-09: 1000 ug via INTRAMUSCULAR

## 2023-07-09 NOTE — Progress Notes (Signed)
Per orders of Dr. Ardyth Harps, injection of B12  given by Stann Ore. Patient tolerated injection well.

## 2023-08-04 ENCOUNTER — Other Ambulatory Visit: Payer: Self-pay | Admitting: Internal Medicine

## 2023-08-04 DIAGNOSIS — F321 Major depressive disorder, single episode, moderate: Secondary | ICD-10-CM
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
## 2023-08-08 ENCOUNTER — Telehealth: Payer: Self-pay

## 2023-08-08 ENCOUNTER — Encounter: Payer: Self-pay | Admitting: Neurology

## 2023-08-08 ENCOUNTER — Ambulatory Visit: Payer: Medicare Other | Admitting: Neurology

## 2023-08-08 VITALS — BP 96/60 | HR 74 | Ht 66.0 in | Wt 157.4 lb

## 2023-08-08 DIAGNOSIS — F0390 Unspecified dementia without behavioral disturbance: Secondary | ICD-10-CM

## 2023-08-08 DIAGNOSIS — I680 Cerebral amyloid angiopathy: Secondary | ICD-10-CM

## 2023-08-08 DIAGNOSIS — F1020 Alcohol dependence, uncomplicated: Secondary | ICD-10-CM | POA: Diagnosis not present

## 2023-08-08 MED ORDER — DONEPEZIL HCL 5 MG PO TABS: 5.0000 mg | ORAL_TABLET | Freq: Every day | ORAL | 3 refills | Status: DC

## 2023-08-08 MED ORDER — MEMANTINE HCL 10 MG PO TABS: ORAL_TABLET | ORAL | 3 refills | Status: DC

## 2023-08-08 NOTE — Patient Instructions (Signed)
Continue donepezil 5mg  at bedtime and memantine 10mg  twice daily Will prepare letter

## 2023-08-08 NOTE — Telephone Encounter (Signed)
Letter sent to Mychart per patient daughter.

## 2023-08-13 ENCOUNTER — Ambulatory Visit: Payer: Medicare Other

## 2023-08-13 DIAGNOSIS — S92301A Fracture of unspecified metatarsal bone(s), right foot, initial encounter for closed fracture: Secondary | ICD-10-CM | POA: Diagnosis not present

## 2023-08-13 DIAGNOSIS — M79671 Pain in right foot: Secondary | ICD-10-CM | POA: Diagnosis not present

## 2023-08-15 ENCOUNTER — Other Ambulatory Visit: Payer: Self-pay | Admitting: Internal Medicine

## 2023-08-15 DIAGNOSIS — E781 Pure hyperglyceridemia: Secondary | ICD-10-CM

## 2023-08-18 ENCOUNTER — Other Ambulatory Visit: Payer: Self-pay | Admitting: Neurology

## 2023-08-19 DIAGNOSIS — S92301A Fracture of unspecified metatarsal bone(s), right foot, initial encounter for closed fracture: Secondary | ICD-10-CM | POA: Diagnosis not present

## 2023-08-27 ENCOUNTER — Ambulatory Visit (INDEPENDENT_AMBULATORY_CARE_PROVIDER_SITE_OTHER): Payer: Medicare Other

## 2023-08-27 DIAGNOSIS — E538 Deficiency of other specified B group vitamins: Secondary | ICD-10-CM

## 2023-08-27 MED ORDER — CYANOCOBALAMIN 1000 MCG/ML IJ SOLN
1000.0000 ug | Freq: Once | INTRAMUSCULAR | Status: AC
  Administered 2023-08-27: 1000 ug via INTRAMUSCULAR

## 2023-08-27 NOTE — Progress Notes (Unsigned)
Per orders of Dr. Ardyth Harps, injection of B-12 given by Stann Ore. Patient tolerated injection well.

## 2023-09-01 ENCOUNTER — Other Ambulatory Visit: Payer: Self-pay | Admitting: Internal Medicine

## 2023-09-01 DIAGNOSIS — F321 Major depressive disorder, single episode, moderate: Secondary | ICD-10-CM

## 2023-09-12 ENCOUNTER — Other Ambulatory Visit: Payer: Self-pay | Admitting: Internal Medicine

## 2023-09-12 DIAGNOSIS — E781 Pure hyperglyceridemia: Secondary | ICD-10-CM

## 2023-09-16 DIAGNOSIS — S92301A Fracture of unspecified metatarsal bone(s), right foot, initial encounter for closed fracture: Secondary | ICD-10-CM | POA: Diagnosis not present

## 2023-09-28 ENCOUNTER — Other Ambulatory Visit: Payer: Self-pay | Admitting: Internal Medicine

## 2023-09-28 DIAGNOSIS — F321 Major depressive disorder, single episode, moderate: Secondary | ICD-10-CM

## 2023-10-03 ENCOUNTER — Telehealth: Payer: Self-pay | Admitting: Internal Medicine

## 2023-10-03 ENCOUNTER — Other Ambulatory Visit: Payer: Self-pay | Admitting: Internal Medicine

## 2023-10-03 MED ORDER — METOPROLOL TARTRATE 25 MG PO TABS: 25.0000 mg | ORAL_TABLET | Freq: Two times a day (BID) | ORAL | 0 refills | Status: DC

## 2023-10-03 NOTE — Telephone Encounter (Signed)
Daughter called to say the pharmacy just called her to say the Rx for the metoprolol tartrate (LOPRESSOR) 25 MG tablets was denied.   Daughter states she is not sure why. Daughter added that Pt has already been scheduled for a CPE on 10/16/23. Daughter is wondering if MD is changing the dosage or sending an alternative? Daughter is requesting a call back, for clarity.

## 2023-10-03 NOTE — Telephone Encounter (Signed)
Refill sent.

## 2023-10-13 ENCOUNTER — Other Ambulatory Visit: Payer: Self-pay | Admitting: Internal Medicine

## 2023-10-13 DIAGNOSIS — E781 Pure hyperglyceridemia: Secondary | ICD-10-CM

## 2023-10-13 DIAGNOSIS — F321 Major depressive disorder, single episode, moderate: Secondary | ICD-10-CM

## 2023-10-15 ENCOUNTER — Encounter: Payer: Self-pay | Admitting: Cardiovascular Disease

## 2023-10-15 ENCOUNTER — Ambulatory Visit: Payer: Medicare Other | Attending: Cardiovascular Disease | Admitting: Cardiovascular Disease

## 2023-10-15 ENCOUNTER — Encounter: Payer: Medicare Other | Admitting: Internal Medicine

## 2023-10-15 VITALS — BP 88/58 | HR 96 | Ht 68.0 in | Wt 156.2 lb

## 2023-10-15 DIAGNOSIS — G309 Alzheimer's disease, unspecified: Secondary | ICD-10-CM

## 2023-10-15 DIAGNOSIS — Z952 Presence of prosthetic heart valve: Secondary | ICD-10-CM | POA: Diagnosis not present

## 2023-10-15 DIAGNOSIS — R002 Palpitations: Secondary | ICD-10-CM | POA: Diagnosis not present

## 2023-10-15 DIAGNOSIS — I4821 Permanent atrial fibrillation: Secondary | ICD-10-CM | POA: Diagnosis not present

## 2023-10-15 DIAGNOSIS — Z9181 History of falling: Secondary | ICD-10-CM

## 2023-10-15 DIAGNOSIS — E785 Hyperlipidemia, unspecified: Secondary | ICD-10-CM

## 2023-10-15 DIAGNOSIS — I10 Essential (primary) hypertension: Secondary | ICD-10-CM | POA: Diagnosis not present

## 2023-10-15 DIAGNOSIS — F02B0 Dementia in other diseases classified elsewhere, moderate, without behavioral disturbance, psychotic disturbance, mood disturbance, and anxiety: Secondary | ICD-10-CM

## 2023-10-15 NOTE — Patient Instructions (Addendum)
Medication Instructions:  Take metoprolol tartrate (LOPRESSOR) 25 MG tablet. Take one whole tablet in th morning. Take one and half tablet at bedtime *If you need a refill on your cardiac medications before your next appointment, please call your pharmacy*   Lab Work: None If you have labs (blood work) drawn today and your tests are completely normal, you will receive your results only by: MyChart Message (if you have MyChart) OR A paper copy in the mail If you have any lab test that is abnormal or we need to change your treatment, we will call you to review the results.   Testing/Procedures: Your physician has requested that you have an echocardiogram in April 2025. Echocardiography is a painless test that uses sound waves to create images of your heart. It provides your doctor with information about the size and shape of your heart and how well your heart's chambers and valves are working. This procedure takes approximately one hour. There are no restrictions for this procedure. Please do NOT wear cologne, perfume, aftershave, or lotions (deodorant is allowed). Please arrive 15 minutes prior to your appointment time.  Please note: We ask at that you not bring children with you during ultrasound (echo/ vascular) testing. Due to room size and safety concerns, children are not allowed in the ultrasound rooms during exams. Our front office staff cannot provide observation of children in our lobby area while testing is being conducted. An adult accompanying a patient to their appointment will only be allowed in the ultrasound room at the discretion of the ultrasound technician under special circumstances. We apologize for any inconvenience.    Follow-Up: At [facility], you and your health needs are our priority.  As part of our continuing mission to provide you with exceptional heart care, we have created designated Provider Care Teams.  These Care Teams include your primary  Cardiologist (physician) and Advanced Practice Providers (APPs -  Physician Assistants and Nurse Practitioners) who all work together to provide you with the care you need, when you need it.  We recommend signing up for the patient portal called "MyChart".  Sign up information is provided on this After Visit Summary.  MyChart is used to connect with patients for Virtual Visits (Telemedicine).  Patients are able to view lab/test results, encounter notes, upcoming appointments, etc.  Non-urgent messages can be sent to your provider as well.   To learn more about what you can do with MyChart, go to [email or web address].    Your next appointment:   4 month(s)  Provider:   Azalee Course, PA-C    Then, Dr. Tonny Bollman will plan to see you again in 10 month(s). {If Card or EP not listed click to update   DO NOT delete brackets or number a

## 2023-10-16 ENCOUNTER — Ambulatory Visit: Payer: Medicare Other | Admitting: Internal Medicine

## 2023-10-16 DIAGNOSIS — E538 Deficiency of other specified B group vitamins: Secondary | ICD-10-CM

## 2023-10-16 DIAGNOSIS — R419 Unspecified symptoms and signs involving cognitive functions and awareness: Secondary | ICD-10-CM

## 2023-10-16 DIAGNOSIS — H9193 Unspecified hearing loss, bilateral: Secondary | ICD-10-CM

## 2023-10-16 DIAGNOSIS — E781 Pure hyperglyceridemia: Secondary | ICD-10-CM

## 2023-10-16 DIAGNOSIS — E559 Vitamin D deficiency, unspecified: Secondary | ICD-10-CM

## 2023-10-16 DIAGNOSIS — E785 Hyperlipidemia, unspecified: Secondary | ICD-10-CM

## 2023-10-16 DIAGNOSIS — F321 Major depressive disorder, single episode, moderate: Secondary | ICD-10-CM

## 2023-10-16 DIAGNOSIS — Z0000 Encounter for general adult medical examination without abnormal findings: Secondary | ICD-10-CM

## 2023-10-16 DIAGNOSIS — I10 Essential (primary) hypertension: Secondary | ICD-10-CM

## 2023-10-16 DIAGNOSIS — N401 Enlarged prostate with lower urinary tract symptoms: Secondary | ICD-10-CM

## 2023-10-16 DIAGNOSIS — R351 Nocturia: Secondary | ICD-10-CM

## 2023-10-16 DIAGNOSIS — F429 Obsessive-compulsive disorder, unspecified: Secondary | ICD-10-CM

## 2023-10-16 DIAGNOSIS — Z23 Encounter for immunization: Secondary | ICD-10-CM | POA: Diagnosis not present

## 2023-10-16 LAB — LIPID PANEL
Cholesterol: 144 mg/dL (ref 0–200)
HDL: 59.1 mg/dL (ref >39.00)
LDL Cholesterol: 69 mg/dL (ref 0–99)
NonHDL: 85.08
Total CHOL/HDL Ratio: 2
Triglycerides: 80 mg/dL (ref 0.0–149.0)
VLDL: 16 mg/dL (ref 0.0–40.0)

## 2023-10-16 LAB — CBC WITH DIFFERENTIAL/PLATELET
Basophils Absolute: 0.1 10*3/uL (ref 0.0–0.1)
Basophils Relative: 1.5 % (ref 0.0–3.0)
Eosinophils Absolute: 0.1 10*3/uL (ref 0.0–0.7)
Eosinophils Relative: 1.2 % (ref 0.0–5.0)
HCT: 40.4 % (ref 39.0–52.0)
Hemoglobin: 13.5 g/dL (ref 13.0–17.0)
Lymphocytes Relative: 26.3 % (ref 12.0–46.0)
Lymphs Abs: 1.5 10*3/uL (ref 0.7–4.0)
MCHC: 33.4 g/dL (ref 30.0–36.0)
MCV: 96.2 fL (ref 78.0–100.0)
Monocytes Absolute: 0.4 10*3/uL (ref 0.1–1.0)
Monocytes Relative: 6.4 % (ref 3.0–12.0)
Neutro Abs: 3.7 10*3/uL (ref 1.4–7.7)
Neutrophils Relative %: 64.6 % (ref 43.0–77.0)
Platelets: 175 10*3/uL (ref 150.0–400.0)
RBC: 4.2 Mil/uL — ABNORMAL LOW (ref 4.22–5.81)
RDW: 12.9 % (ref 11.5–15.5)
WBC: 5.8 10*3/uL (ref 4.0–10.5)

## 2023-10-16 LAB — TSH: TSH: 2.37 u[IU]/mL (ref 0.35–5.50)

## 2023-10-16 LAB — COMPREHENSIVE METABOLIC PANEL
ALT: 17 U/L (ref 0–53)
AST: 18 U/L (ref 0–37)
Albumin: 4.2 g/dL (ref 3.5–5.2)
Alkaline Phosphatase: 69 U/L (ref 39–117)
BUN: 17 mg/dL (ref 6–23)
CO2: 28 meq/L (ref 19–32)
Calcium: 9.1 mg/dL (ref 8.4–10.5)
Chloride: 105 meq/L (ref 96–112)
Creatinine, Ser: 0.79 mg/dL (ref 0.40–1.50)
GFR: 82.22 mL/min (ref >60.00)
Glucose, Bld: 89 mg/dL (ref 70–99)
Potassium: 4.2 meq/L (ref 3.5–5.1)
Sodium: 139 meq/L (ref 135–145)
Total Bilirubin: 1.2 mg/dL (ref 0.2–1.2)
Total Protein: 6.3 g/dL (ref 6.0–8.3)

## 2023-10-16 LAB — VITAMIN B12: Vitamin B-12: >1537 pg/mL — ABNORMAL HIGH (ref 211–911)

## 2023-10-16 LAB — PSA: PSA: 0.02 ng/mL — ABNORMAL LOW (ref 0.10–4.00)

## 2023-10-16 LAB — VITAMIN D 25 HYDROXY (VIT D DEFICIENCY, FRACTURES): VITD: 24.2 ng/mL — ABNORMAL LOW (ref 30.00–100.00)

## 2023-10-16 MED ORDER — MEMANTINE HCL 10 MG PO TABS: ORAL_TABLET | ORAL | 1 refills | Status: DC

## 2023-10-16 MED ORDER — DONEPEZIL HCL 5 MG PO TABS: 5.0000 mg | ORAL_TABLET | Freq: Every day | ORAL | 1 refills | Status: AC

## 2023-10-16 MED ORDER — TAMSULOSIN HCL 0.4 MG PO CAPS: 0.4000 mg | ORAL_CAPSULE | Freq: Every day | ORAL | 0 refills | Status: DC

## 2023-10-16 MED ORDER — SERTRALINE HCL 25 MG PO TABS: 25.0000 mg | ORAL_TABLET | Freq: Every day | ORAL | 1 refills | Status: DC

## 2023-10-16 MED ORDER — ATORVASTATIN CALCIUM 10 MG PO TABS: 10.0000 mg | ORAL_TABLET | Freq: Every day | ORAL | 1 refills | Status: DC

## 2023-10-16 MED ORDER — METOPROLOL TARTRATE 25 MG PO TABS: 25.0000 mg | ORAL_TABLET | Freq: Two times a day (BID) | ORAL | 1 refills | Status: DC

## 2023-10-16 MED ORDER — CYANOCOBALAMIN 1000 MCG/ML IJ SOLN
1000.0000 ug | Freq: Once | INTRAMUSCULAR | Status: AC
  Administered 2023-10-16: 1000 ug via INTRAMUSCULAR

## 2023-10-16 NOTE — Patient Instructions (Signed)
-  Nice seeing you today!!  -Lab work today; will notify you once results are available.  -Flu vaccine today. Remember to update COVID, shingles Tdap and pharmacy.

## 2023-10-16 NOTE — Progress Notes (Signed)
Established Patient Office Visit     CC/Reason for Visit: Annual preventive exam, subsequent Medicare wellness visit and discuss some acute concerns  HPI: Marcus Galloway is a 82 y.o. male who is coming in today for the above mentioned reasons. 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  His daughter states that his OCD has gotten worse.  He is also having severe difficulty hearing and are interested in looking into hearing aids.  He is due for flu, COVID, shingles, Tdap vaccines.  No further cancer screening due to age and comorbidities requested.   Past Medical/Surgical History: Past Medical History:  Diagnosis Date   Aortic valve disease    Atrial fibrillation (HCC)    BPH (benign prostatic hyperplasia)    Chronic neck pain    Complication of anesthesia    passed during surgery - valve replacement 2013 at [facility]   Dementia [facility])    GERD (gastroesophageal reflux disease)    History of colonic polyps    History of heavy alcohol consumption    Hyperlipidemia    Hypertension    Osteoarthritis    Persistent atrial fibrillation (HCC)    Polio    Prostate cancer [facility])     Past Surgical History:  Procedure Laterality Date   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   CARDIOVERSION  09/2016   HERNIA REPAIR  1990   INGUINAL HERNIA REPAIR Right 08/27/2022   Procedure: OPEN RIGHT INGUINAL HERNIA REPAIR WITH MESH;  Surgeon: Griselda Miner, MD;  Location: [facility] OR;  Service: General;  Laterality: Right;   INSERTION OF MESH Right 08/27/2022   Procedure: INSERTION OF MESH;  Surgeon: Griselda Miner, MD;  Location: [facility] OR;  Service: General;  Laterality: Right;   INSERTION PROSTATE RADIATION SEED  2002    Social History:  reports that he quit smoking about 41 years ago. His smoking use included cigarettes. He started  smoking about 72 years ago. He has a 90 pack-year smoking history. He has never used smokeless tobacco. He reports current alcohol use of about 39.0 standard drinks of alcohol per week. He reports that he does not use drugs.  Allergies: No Known Allergies  Family History:  Family History  Adopted: Yes  Family history unknown: Yes     Current Outpatient Medications:    aspirin EC 81 MG tablet, Take 1 tablet (81 mg total) by mouth daily. Swallow whole., Disp: 90 tablet, Rfl: 3   docusate sodium (COLACE) 100 MG capsule, Take 100 mg by mouth 2 (two) times daily as needed for mild constipation., Disp: , Rfl:    atorvastatin (LIPITOR) 10 MG tablet, Take 1 tablet (10 mg total) by mouth daily., Disp: 90 tablet, Rfl: 1   donepezil (ARICEPT) 5 MG tablet, Take 1 tablet (5 mg total) by mouth at bedtime., Disp: 90 tablet, Rfl: 1   memantine (NAMENDA) 10 MG tablet, TAKE 1 TABLET BY MOUTH TWICE A DAY, Disp: 180 tablet, Rfl: 1   metoprolol tartrate (LOPRESSOR) 25 MG tablet, Take 1 tablet (25 mg total) by mouth 2 (two) times daily., Disp: 180 tablet, Rfl: 1   sertraline (ZOLOFT) 25 MG tablet, Take 1 tablet (25 mg total) by mouth daily., Disp: 90 tablet, Rfl: 1   tamsulosin (FLOMAX) 0.4 MG CAPS capsule, Take 1 capsule (0.4 mg total) by mouth daily., Disp: 30 capsule, Rfl:  0  Review of Systems:  Negative unless indicated in HPI.   Physical Exam: There were no vitals filed for this visit.  There is no height or weight on file to calculate BMI.   Physical Exam Vitals reviewed.  Constitutional:      General: He is not in acute distress.    Appearance: Normal appearance. He is not ill-appearing, toxic-appearing or diaphoretic.  HENT:     Head: Normocephalic.     Right Ear: Tympanic membrane, ear canal and external ear normal. There is no impacted cerumen.     Left Ear: Tympanic membrane, ear canal and external ear normal. There is no impacted cerumen.     Nose: Nose normal.     Mouth/Throat:      Mouth: Mucous membranes are moist.     Pharynx: Oropharynx is clear. No oropharyngeal exudate or posterior oropharyngeal erythema.  Eyes:     General: No scleral icterus.       Right eye: No discharge.        Left eye: No discharge.     Conjunctiva/sclera: Conjunctivae normal.     Pupils: Pupils are equal, round, and reactive to light.  Neck:     Vascular: No carotid bruit.  Cardiovascular:     Rate and Rhythm: Normal rate and regular rhythm.     Pulses: Normal pulses.     Heart sounds: Normal heart sounds.  Pulmonary:     Effort: Pulmonary effort is normal. No respiratory distress.     Breath sounds: Normal breath sounds.  Abdominal:     General: Abdomen is flat. Bowel sounds are normal.     Palpations: Abdomen is soft.  Musculoskeletal:        General: Normal range of motion.     Cervical back: Normal range of motion.  Skin:    General: Skin is warm and dry.  Neurological:     General: No focal deficit present.     Mental Status: He is alert and oriented to person, place, and time. Mental status is at baseline.  Psychiatric:        Mood and Affect: Mood normal.        Behavior: Behavior normal.     Subsequent Medicare wellness visit   1. Risk factors, based on past  M,S,F - Cardiac Risk Factors include: advanced age (>64men, >6 women);dyslipidemia;hypertension;male gender   2.  Physical activities: Dietary issues and exercise activities discussed:      3.  Depression/mood:  Flowsheet Row Office Visit from 10/16/2023 in [facility] Total Score 8        4.  ADL's:    10/16/2023   10:07 AM  In your present state of health, do you have any difficulty performing the following activities:  Hearing? 1  Vision? 1  Difficulty concentrating or making decisions? 1  Walking or climbing stairs? 1  Dressing or bathing? 0  Doing errands, shopping? 1  Preparing Food and eating ? N  Using the Toilet? N  In the past six months,  have you accidently leaked urine? N  Do you have problems with loss of bowel control? N  Managing your Medications? N  Managing your Finances? Y  Housekeeping or managing your Housekeeping? N     5.  Fall risk:     08/27/2022   12:44 PM 11/07/2022   10:50 AM 11/12/2022   10:08 AM 11/12/2022   11:36 AM 10/16/2023   10:17 AM  Fall Risk  Falls in the past year?  0 0 0 0  Was there an injury with Fall?  0  0 0  Fall Risk Category Calculator  0  0 0  Fall Risk Category (Retired)  Low     (RETIRED) Patient Fall Risk Level Low fall risk      Patient at Risk for Falls Due to    No Fall Risks   Fall risk Follow up    Falls evaluation completed Falls evaluation completed     6.  Home safety: No problems identified   7.  Height weight, and visual acuity: height and weight as above, vision/hearing: Vision Screening   Right eye Left eye Both eyes  Without correction     With correction 20/70 20/70 20/70      8.  Counseling: Counseling given: Not Answered Tobacco comments: quit smoking 1980    9. Lab orders based on risk factors: Laboratory update will be reviewed   10. Cognitive assessment:    08/08/2023   11:00 AM 11/07/2022   12:00 PM 07/17/2018    1:42 PM 07/12/2017   10:39 AM  MMSE - Mini Mental State Exam  Orientation to time 3 5 5 5   Orientation to Place 5 5 5 5   Registration 3 3 3 3   Attention/ Calculation 5 1 4 5   Attention/Calculation-comments   wrote the serial 7's    Recall 0 2 3 2   Language- name 2 objects 2 2 2 2   Language- repeat 1 1 1 1   Language- follow 3 step command 3 3 3 3   Language- read & follow direction 1 1 1 1   Write a sentence 0 1 1 1   Copy design 1 1 1 1   Total score 24 25 29 29       05/14/2019    2:00 PM 03/10/2018    2:00 PM  Montreal Cognitive Assessment   Visuospatial/ Executive (0/5)  4  Naming (0/3)  3  Attention: Read list of digits (0/2) 1 2  Attention: Read list of letters (0/1) 1 1  Attention: Serial 7 subtraction starting at 100  (0/3) 1 1  Language: Repeat phrase (0/2) 1 1  Language : Fluency (0/1) 0 0  Abstraction (0/2) 0 1  Delayed Recall (0/5) 2 0  Orientation (0/6) 6 6  Total  19  Adjusted Score (based on education)  20      10/16/2023   10:10 AM  6CIT Screen  What Year? 0 points  What time? 0 points  Count back from 20 0 points  Months in reverse 4 points  Repeat phrase 10 points     11. Screening: Patient provided with a written and personalized 5-10 year screening schedule in the AVS. Health Maintenance  Topic Date Due   COVID-19 Vaccine (1) Never done   DTaP/Tdap/Td vaccine (1 - Tdap) Never done   Zoster (Shingles) Vaccine (1 of 2) Never done   Medicare Annual Wellness Visit  03/23/2023   Pneumonia Vaccine  Completed   Flu Shot  Completed   HPV Vaccine  Aged Out    12. Provider List Update: Patient Care Team    Relationship Specialty Notifications Start End  Philip Aspen, Limmie Patricia, MD PCP - General Internal Medicine  07/14/19   Lennette Bihari, MD PCP - Cardiology Cardiology  07/14/19   Lennette Bihari, MD Consulting Physician Cardiology  07/12/17   Drema Dallas, DO Consulting Physician Neurology  05/14/19  13. Advance Directives: Does Patient Have a Medical Advance Directive?: Yes Type of Advance Directive: Healthcare Power of Attorney, Living will Does patient want to make changes to medical advance directive?: No - Patient declined Copy of Healthcare Power of Attorney in Chart?: No - copy requested  14. Opioids: Patient is not on any opioid prescriptions and has no risk factors for a substance use disorder.   15.   Goals      Home and Family Safety Maintained     Evidence-based guidance:  Identify and review with patient potential safety risks from home or living environment, automobile driving and/or riding, as well as firearms.   Provide anticipatory guidance related to specific risks to safety; brainstorm acceptable strategies to reduce risk.  Identify resources  needed to improve or maintain safety.  Provide emergency services contact information; encourage placement of contact information in easy to locate place at home and in wallet, purse or backpack.   Notes:      Obtain Supportive Resources to strengthen independence     Care Coordination Interventions: Active listening / Reflection utilized  Emotional Support Provided Caregiver stress acknowledged  Verbalization of feelings encouraged  Patient spoke with pts' daughter, [name] Surgery went great. Has a post surgery visit 09/26/22 Patient continues to obtain B12 shots Family continues to visit patient noting he is still residing independently. He is "doing pretty good" Patient enjoyed spending time with family for the holidays and continues to remain social with friend/neighbor LCSW assessed pt's mental health symptoms. Daughter shared pt's reality becomes blended, at times. Validation and encouragement provided. Family will continue to track and monitor behavior changes. No report of immediate or imminent safety risk Pt is not interested in home care or family assistance with ADLs. Family will review resources provided regarding home care agencies and inquire about services, when appropriate        Patient Stated     Find something you can do that you enjoy!  Think about it and get out of your home.          I have personally reviewed and noted the following in the patient's chart:   Medical and social history Use of alcohol, tobacco or illicit drugs  Current medications and supplements Functional ability and status Nutritional status Physical activity Advanced directives List of other physicians Hospitalizations, surgeries, and ER visits in previous 12 months Vitals Screenings to include cognitive, depression, and falls Referrals and appointments  In addition, I have reviewed and discussed with patient certain preventive protocols, quality metrics, and best practice  recommendations. A written personalized care plan for preventive services as well as general preventive health recommendations were provided to patient.   Impression and Plan:  Vitamin B12 deficiency -     Vitamin B12; Future -     Cyanocobalamin  Primary hypertension -     CBC with Differential/Platelet; Future -     Comprehensive metabolic panel; Future -     Metoprolol Tartrate; Take 1 tablet (25 mg total) by mouth 2 (two) times daily.  Dispense: 180 tablet; Refill: 1  Vitamin D deficiency -     VITAMIN D 25 Hydroxy (Vit-D Deficiency, Fractures); Future  Hyperlipidemia, unspecified hyperlipidemia type  Pure hyperglyceridemia -     Atorvastatin Calcium; Take 1 tablet (10 mg total) by mouth daily.  Dispense: 90 tablet; Refill: 1  Depression, major, single episode, moderate (HCC) -     Sertraline HCl; Take 1 tablet (25 mg total) by mouth daily.  Dispense:  90 tablet; Refill: 1  Obsessive-compulsive disorder, unspecified type -     Ambulatory referral to Psychiatry  Bilateral hearing loss, unspecified hearing loss type -     Ambulatory referral to Audiology  Benign prostatic hyperplasia with nocturia -     PSA; Future -     Tamsulosin HCl; Take 1 capsule (0.4 mg total) by mouth daily.  Dispense: 30 capsule; Refill: 0  Neurocognitive disorder -     Lipid panel; Future -     TSH; Future -     Donepezil HCl; Take 1 tablet (5 mg total) by mouth at bedtime.  Dispense: 90 tablet; Refill: 1 -     Memantine HCl; TAKE 1 TABLET BY MOUTH TWICE A DAY  Dispense: 180 tablet; Refill: 1  Needs flu shot -     Flu Vaccine Trivalent High Dose (Fluad)   -Recommend routine eye and dental care. -Healthy lifestyle discussed in detail. -Labs to be updated today. -Prostate cancer screening: PSA today Health Maintenance  Topic Date Due   COVID-19 Vaccine (1) Never done   DTaP/Tdap/Td vaccine (1 - Tdap) Never done   Zoster (Shingles) Vaccine (1 of 2) Never done   Medicare Annual Wellness  Visit  03/23/2023   Pneumonia Vaccine  Completed   Flu Shot  Completed   HPV Vaccine  Aged Out     -Refer to audiology for bilateral hearing loss. -Refer to psychiatry given complicated history with OCD, depression and anxiety. -Flu vaccine given in office today.  Advised to update COVID, Tdap, shingles at pharmacy.    Chaya Jan, MD [facility] at [facility]

## 2023-10-17 ENCOUNTER — Other Ambulatory Visit: Payer: Self-pay | Admitting: Internal Medicine

## 2023-10-17 ENCOUNTER — Encounter: Payer: Self-pay | Admitting: Cardiovascular Disease

## 2023-10-17 DIAGNOSIS — E559 Vitamin D deficiency, unspecified: Secondary | ICD-10-CM

## 2023-10-17 MED ORDER — VITAMIN D (ERGOCALCIFEROL) 1.25 MG (50000 UNIT) PO CAPS: 50000.0000 [IU] | ORAL_CAPSULE | ORAL | 0 refills | Status: AC

## 2023-10-17 NOTE — Progress Notes (Signed)
Cardiology Office Note    Date:  10/17/2023   ID:  Marcus Galloway, DOB Jan 27, 1941, MRN [id number]  PCP:  Marcus Galloway, Marcus Patricia, Marcus Galloway  Cardiologist:  Marcus Guadalajara, Marcus Galloway   Chief Complaint  Patient presents with   Dizziness    Pt. Reports he woke up this morning and was having pain that felt like his hernia is returning. He states he is very dizzy right now, and is very confused. Pt. Did not eat breakfast, he states he also had the same feeling a few days ago. Pt. Repeated himself numerous times    21-month follow-up cardiology evaluation   History of Present Illness:  Marcus Galloway is a 82 y.o. male Caucasian male who moved to [place] from [facility] [place].  He established cardiology care with me in March 2018. I last saw him in June 2023.  He presents for an 18 month follow-up evaluation.  Mr. 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  He was found to be in atrial fibrillation on routine office visit in November 2017.  Rate control and anticoagulation were initiated at that time and he ultimately underwent cardioversion.  He developed recurrent atrial fibrillation and underwent sotalol loading with repeat cardioversion.  An echo Doppler study had shown normal LV size and function with an EF of 50-55%, biatrial enlargement, mild-to-moderate MR, moderate TR, and a normal prosthetic aortic valve.  When last seen by his cardiologist, Dr. Asa Galloway in  [facility] at [facility] on 12/10/2016 , he was back in atrial fibrillation.  At that time, eliquis was discontinued as was sotalol and he was started on metoprolol tartrate 75 mg twice a day and Coumadin for anticoagulation.  He has subsequently moved to [place] and establish care with me.    His initial evaluation with me he denied any episodes of chest pain.  He reportedly had undergone  cardiac catheterization prior to his valve replacement and his coronary arteries were clean.  Recently, he had noticed exertional dyspnea.  He is unaware of rapid heartbeats on his current dose of metoprolol.  He admits to adequate sleep.  He is unaware of awakening gasping for breath, and he believes his sleep is restorative.  When I initially saw him his ECG revealed atrial fibrillation with ventricular rate in the 80s.  I recommended discontinuance of amlodipine and change to diltiazem CD 180 mg for improved rate control as well.  Blood pressure control.  I recommended a follow-up echo Doppler study, and this was done on 02/01/2017.  This revealed an ejection fraction of 60-65%.  He had normal wall motion without regional wall motion abnormalities.  His bioprosthetic aortic valve was well-seated, without obstruction and only with trivial regurgitation. He had severe biatrial enlargement.  There was mild pulmonary hypertension at 36 mm with mild TR.    When I saw him in September 2018 he had noticed some mild episodes of dizziness and also some vague chest wall discomfort.  He admitted to being sleepy during the day.  He typically goes to bed at 11 PM and wakes up at 7 AM.  Laboratory in May 2018 was stable.  His cholesterol was 151, LDL cholesterol 68, triglycerides 130, HDL 57.  He was taking atorvastatin 10 mg, metoprolol 75 mg twice a day, warfarin anticoagulation, Cartia XT 180 mg in addition to Flomax.  He was feeling  significantly improved on diltiazem instead of amlodipine and I recommended slight reduction of metoprolol due to some transient episodes of mild dizziness.  I saw him for 2-year follow-up evaluation in September 2020.  Over the past several years he felt that he was been doing fairly well.  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  He remained free  of chest pain.  He denies PND orthopnea.  He continues to be on warfarin without bleeding.  He continues to be on diltiazem 180 mg, metoprolol 50 mg twice a day and is on atorvastatin at 10 mg daily for hyperlipidemia.    I recommended he undergo a follow-up echo Doppler study prior to 85-month follow-up office visit.  He never the echocardiographic evaluation done.  I last saw him in March 2021 and over the prior 6 months  continued to be stable.  He denied chest pain PND orthopnea, palpitations, presyncope or syncope.  Denies difficulty with sleep.  He continued to be on his medical therapy and had undergone follow-up laboratory by his primary physician in February.   He underwent a follow-up echo Doppler study in August 04, 2020 which showed normal LV function with EF of 55 to 60% with LVH.  His 25 mm bioprosthetic aortic valve was well-seated.  There was no aortic regurgitation.  He had severe biatrial enlargement.  I saw him on December 15, 2020.  At that time he felt well.  He had a fall approximately 1 month ago.  He was seen by neurology and EEG and CTs were negative.  He denied any chest pain or shortness of breath.    I last saw him on April 24, 2022 and according to his daughter he had had several additional falls.  Due to to an unsteady gait he walks with a large walking stick.  She also states that he drinks excessively on a daily basis.  When seen by Dr. Philip Galloway, she was concerned about fall risk and ultimately recommended he discontinue his chronic warfarin therapy which he was on for his atrial fibrillation.  She also discontinued his diltiazem due to low blood pressure.  Presently, he denies any chest pain.  He is having progressive dementia and according to the daughter his memory is very poor.  He is on atorvastatin 10 mg, metoprolol tartrate 50 mg twice a day.  He is on Zoloft 25 mg, Namenda 10 mg and Aricept 5 mg.    Since I last saw him, he was seen by Marcus Person, Marcus Galloway on  August 14, 2022 for preoperative clearance for hernia surgery.  On August 27, 2022 he underwent successful right inguinal hernia surgery by Dr. Chevis Galloway.  He was last evaluated in our office by Marcus Course, Marcus Galloway on June 05, 2023.  Because of episodes of dizziness, his metoprolol dose was reduced.  Mr. Anner Crete is here with his daughter today.  He does have issues with migraine.  He also has memory problems and sees Marcus Galloway for dementia.  Currently he is living at [facility] and lives by himself.  At times he wakes up in the melanite noticing his heart racing.  His medications now include aspirin 81 mg daily.  He is taking metoprolol 25 mg twice a day, atorvastatin 10 mg, and is on both Aricept and Namenda for his dementia.  He is 11  years status post aortic valve replacement with a bioprosthetic aortic valve.  He presents for evaluation.   Past Medical History:  Diagnosis Date   Aortic valve disease    Atrial fibrillation (HCC)    BPH (benign prostatic hyperplasia)    Chronic neck pain    Complication of anesthesia    passed during surgery - valve replacement 2013 at [facility]   Dementia [facility])    GERD (gastroesophageal reflux disease)    History of colonic polyps    History of heavy alcohol consumption    Hyperlipidemia    Hypertension    Osteoarthritis    Persistent atrial fibrillation (HCC)    Polio    Prostate cancer [facility]     Past Surgical History:  Procedure Laterality Date   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   CARDIOVERSION  09/2016   HERNIA REPAIR  1990   INGUINAL HERNIA REPAIR Right 08/27/2022   Procedure: OPEN RIGHT INGUINAL HERNIA REPAIR WITH MESH;  Surgeon: Marcus Miner, Marcus Galloway;  Location: [facility] OR;  Service: General;  Laterality: Right;   INSERTION OF MESH Right 08/27/2022   Procedure: INSERTION OF MESH;  Surgeon: Marcus Miner, Marcus Galloway;  Location: [facility] OR;  Service: General;  Laterality: Right;   INSERTION PROSTATE RADIATION SEED  2002    Current  Medications: Outpatient Medications Prior to Visit  Medication Sig Dispense Refill   aspirin EC 81 MG tablet Take 1 tablet (81 mg total) by mouth daily. Swallow whole. 90 tablet 3   docusate sodium (COLACE) 100 MG capsule Take 100 mg by mouth 2 (two) times daily as needed for mild constipation.     atorvastatin (LIPITOR) 10 MG tablet TAKE 1 TABLET BY MOUTH DAILY 30 tablet 0   donepezil (ARICEPT) 5 MG tablet Take 1 tablet (5 mg total) by mouth at bedtime. 90 tablet 3   memantine (NAMENDA) 10 MG tablet TAKE 1 TABLET BY MOUTH TWICE A DAY 180 tablet 3   metoprolol tartrate (LOPRESSOR) 25 MG tablet Take 1 tablet (25 mg total) by mouth 2 (two) times daily. 60 tablet 0   sertraline (ZOLOFT) 25 MG tablet TAKE 1 TABLET BY MOUTH DAILY 15 tablet 0   tamsulosin (FLOMAX) 0.4 MG CAPS capsule TAKE 1 CAPSULE BY MOUTH DAILY 30 capsule 0   No facility-administered medications prior to visit.     Allergies:   Patient has no known allergies.   Social History   Socioeconomic History   Marital status: Widowed    Spouse name: Not on file   Number of children: 4   Years of education: 52   Highest education level: 12th grade  Occupational History   Occupation: Retired  Tobacco Use   Smoking status: Former    Current packs/day: 0.00    Average packs/day: 3.0 packs/day for 30.0 years (90.0 ttl pk-yrs)    Types: Cigarettes    Start date: 12    Quit date: 1983    Years since quitting: 41.9   Smokeless tobacco: Never   Tobacco comments:    quit smoking 1980  Vaping Use   Vaping status: Never Used  Substance and Sexual Activity   Alcohol use: Yes    Alcohol/week: 39.0 standard drinks of alcohol    Types: 21 Glasses of wine, 6 Cans of beer, 12 Shots of liquor per week    Comment: heavy drinker   Drug use: No   Sexual activity: Not Currently  Other Topics Concern   Not on  file  Social History Narrative   Lives alone, recently moved back to [place] from [facility] to be closer to his children   Fist floor of  apartment complex   Right-handed   Caffeine: 3 cups each morning   Social Drivers of Health   Financial Resource Strain: Low Risk  (10/16/2023)   Overall Financial Resource Strain (CARDIA)    Difficulty of Paying Living Expenses: Not hard at all  Food Insecurity: No Food Insecurity (10/16/2023)   Hunger Vital Sign    Worried About Running Out of Food in the Last Year: Never true    Ran Out of Food in the Last Year: Never true  Transportation Needs: No Transportation Needs (10/16/2023)   PRAPARE - Administrator, Civil Service (Medical): No    Lack of Transportation (Non-Medical): No  Physical Activity: Unknown (10/16/2023)   Exercise Vital Sign    Days of Exercise per Week: 0 days    Minutes of Exercise per Session: Not on file  Stress: Stress Concern Present (10/16/2023)   [employer] of Occupational Health - Occupational Stress Questionnaire    Feeling of Stress : Very much  Social Connections: Socially Isolated (10/16/2023)   Social Connection and Isolation Panel [NHANES]    Frequency of Communication with Friends and Family: Once a week    Frequency of Social Gatherings with Friends and Family: Once a week    Attends Religious Services: Never    Database administrator or Organizations: No    Attends Engineer, structural: Not on file    Marital Status: Widowed    Social history is notable in that he is widowed.  He is moved back here to be close to his children.  He had smoked remotely from age 71 until age 27 but quit in 50.  He has 4 children.  One daughter had undergone aortic valve replacement.  He has a half-sister but her whereabouts is unknown.  He drinks daily with alcohol with wine or scotch.   Family History:  He was adopted. Family history is unknown by patient.  However, his biological parents are deceased and he was able to find through records that his mother died at age 51 and his father died at age 33.  ROS General: Negative; No  fevers, chills, or night sweats;  HEENT: Hard of hearing;  No changes in vision sinus congestion, difficulty swallowing Pulmonary: Negative; No cough, wheezing, shortness of breath, hemoptysis Cardiovascular: see HPI GI: History of GERD on omeprazole GU: He has a history of prostate CA and underwent radiation seeds in 2001. Musculoskeletal: Negative; no myalgias, joint pain , or weakness/  he had polio at age 27-6 and was in the hospital for 5 months. Hematologic/Oncology: Negative; no easy bruising, bleeding Endocrine: Negative; no heat/cold intolerance; no diabetes Neuro: Unsteady gait, dementia, Skin: Negative; No rashes or skin lesions Psychiatric: Negative; No behavioral problems, depression Sleep: Negative; No snoring, daytime sleepiness, hypersomnolence, bruxism, restless legs, hypnogognic hallucinations, no cataplexy Other comprehensive 14 point system review is negative.   PHYSICAL EXAM:   VS:  BP (!) 88/58   Pulse 96   Ht 5\' 8"  (1.727 m)   Wt 156 lb 3.2 oz (70.9 kg)   SpO2 94%   BMI 23.75 kg/m     Repeat blood pressure by me was 102/62; resting pulse was increased at 96 and irregular irregular consistent with atrial fibrillation  Wt Readings from Last 3 Encounters:  10/15/23 156 lb 3.2 oz (70.9  kg)  08/08/23 157 lb 6.4 oz (71.4 kg)  06/05/23 159 lb 3.2 oz (72.2 kg)   General: Alert, oriented, no distress.  Skin: normal turgor, no rashes, warm and dry HEENT: Normocephalic, atraumatic. Pupils equal round and reactive to light; sclera anicteric; extraocular muscles intact; Nose without nasal septal hypertrophy Mouth/Parynx benign; Mallinpatti scale 3 Neck: No JVD, no carotid bruits; normal carotid upstroke Lungs: clear to ausculatation and percussion; no wheezing or rales Chest wall: without tenderness to palpitation Heart: PMI not displaced, irregular regular ventricular rate in the 90s, s1 s2 normal, 2/6 systolic murmur, no diastolic murmur, no rubs, gallops, thrills,  or heaves Abdomen: soft, nontender; no hepatosplenomehaly, BS+; abdominal aorta nontender and not dilated by palpation. Back: no CVA tenderness Pulses 2+ Musculoskeletal: full range of motion, normal strength, no joint deformities Extremities: no clubbing cyanosis or edema, Homan's sign negative  Neurologic: grossly nonfocal; Cranial nerves grossly wnl Psychologic: Normal mood and affect     Studies/Labs Reviewed:   EKG Interpretation Date/Time:  Tuesday October 15 2023 11:26:22 EST Ventricular Rate:  94 PR Interval:    QRS Duration:  104 QT Interval:  372 QTC Calculation: 465 R Axis:   -74  Text Interpretation: Atrial fibrillation Left axis deviation Septal infarct (cited on or before 15-Oct-2023) When compared with ECG of 05-Jun-2023 11:21, Vent. rate has increased BY  36 BPM Questionable change in initial forces of Anterior leads Confirmed by Marcus Galloway ([id number]) on 10/17/2023 12:25:17 PM    April 24, 2022 ECG (independently read by me): Atrial fibrillation at 62 bpm, left axis deviation, QS complex V1 through V3 and inferior Q waves.  December 15, 2020 ECG (independently read by me): Atrial fibrillation at 75; LAHB, QS V1-30 December 2019 ECG (independently read by me): Atrial fibrillation with controlled ventricular rate at 66 bpm.  QS complex V1 through V3.  QTc interval 419 ms.     September 2020 ECG (independently read by me): Atrial fibrillation at 73 bpm.  Nonspecific ST changes.  QS complex V1 through V3.  07/23/2017 ECG (independently read by me): Atrial fibrillation at 73 bpm.  Left axis deviation.  September 2018 EKG:  EKG is ordered today. Atrial fibrillation with a slow ventricular response with ventricular rate at 47 bpm.  QRS complex V1 to V3.  Left axis deviation.  QTc interval 35 ms.  May 2018 ECG (independently read by me): Atrial fibrillation at 74 bpm.  Left anterior hemiblock.  QRS complex V1 and V2.  Nonspecific ST changes.  QTc interval 461 ms.  01/22/2017  ECG (independently read by me): Atrial fibrillation at a rate of 84 bpm.  Left anterior hemiblock.  Poor progression V1 through V3.  No significant ST segment changes.  Recent Labs:    Latest Ref Rng & Units 10/16/2023   11:02 AM 01/23/2023    3:26 PM 08/17/2022   11:22 AM  BMP  Glucose 70 - 99 mg/dL 89  89  92   BUN 6 - 23 mg/dL 17  20  16    Creatinine 0.40 - 1.50 mg/dL 4.78  2.95  6.21   Sodium 135 - 145 mEq/L 139  136  133   Potassium 3.5 - 5.1 mEq/L 4.2  4.3  3.9   Chloride 96 - 112 mEq/L 105  101  99   CO2 19 - 32 mEq/L 28  27  25    Calcium 8.4 - 10.5 mg/dL 9.1  9.4  9.0         Latest  Ref Rng & Units 10/16/2023   11:02 AM 08/17/2022   11:22 AM 03/22/2022   11:57 AM  Hepatic Function  Total Protein 6.0 - 8.3 g/dL 6.3  6.9  6.6   Albumin 3.5 - 5.2 g/dL 4.2  4.1  4.3   AST 0 - 37 U/L 18  19  20    ALT 0 - 53 U/L 17  14  15    Alk Phosphatase 39 - 117 U/L 69  64  60   Total Bilirubin 0.2 - 1.2 mg/dL 1.2  1.9  1.3        Latest Ref Rng & Units 10/16/2023   11:02 AM 01/23/2023    3:26 PM 08/17/2022   11:22 AM  CBC  WBC 4.0 - 10.5 K/uL 5.8  6.6  7.2   Hemoglobin 13.0 - 17.0 g/dL 96.2  95.2  84.1   Hematocrit 39.0 - 52.0 % 40.4  43.7  43.7   Platelets 150.0 - 400.0 K/uL 175.0  198.0  193    Lab Results  Component Value Date   MCV 96.2 10/16/2023   MCV 97.6 01/23/2023   MCV 98.2 08/17/2022   Lab Results  Component Value Date   TSH 2.37 10/16/2023   Lab Results  Component Value Date   HGBA1C 5.3 03/22/2022     BNP No results found for: "BNP"  ProBNP No results found for: "PROBNP"   Lipid Panel     Component Value Date/Time   CHOL 144 10/16/2023 1102   TRIG 80.0 10/16/2023 1102   HDL 59.10 10/16/2023 1102   CHOLHDL 2 10/16/2023 1102   VLDL 16.0 10/16/2023 1102   LDLCALC 69 10/16/2023 1102     RADIOLOGY: No results found.   Additional studies/ records that were reviewed today include:  I reviewed the office records from Dr. Venda Rodes at  [facility] in [place], [place].  The echo was reviewed    ASSESSMENT:    1. Permanent atrial fibrillation (HCC)   2. S/P aortic valve replacement   3. Primary hypertension   4. Hyperlipidemia LDL goal <70   5. Palpitations   6. At risk for falls   7. Moderate Alzheimer's dementia, unspecified timing of dementia onset, unspecified whether behavioral, psychotic, or mood disturbance or anxiety (HCC)     PLAN:  Mr. Erubey Suppes is an 82 year old gentleman who is unaware of his family history as he was abandoned by his biologic parents and sent to a foster home.  He underwent aortic valve replacement surgery in December 2013 for which I presume was for aortic stenosis and had a 25 mm Edwards pericardial valve inserted.  At the time of his surgery, he had normal coronary arteries.  He has permanent atrial fibrillation having undergone 4 attempts at cardioversion with recurrent atrial fibrillation and is on warfarin anticoagulation.  His 2-D echo Doppler study from 02/01/2017 demonstrated mild LVH with normal systolic function.  His bioprosthetic aortic valve was well-seated without stenosis and with only trivial AR. There severe biatrial enlargement undoubtedly contributing to his recurrent atrial fibrillation.  There is mild pulmonary  hypertension.  A 3-year follow-up echo Doppler study in August 04, 2020 showed normal LV function with EF at 55 to 60%, moderate LVH, needed 25 mm bioprosthetic aortic valve.  There is no aortic regurgitation.  He continues to have severe biatrial enlargement.  He had normal pulmonary artery systolic pressure.  When I saw him in February 2022 he had previously had a significant  fall in the bathroom where he hit his head.  He underwent neurologic evaluation.  At that time his blood pressure was low and his dose of diltiazem was reduced to 120 mg.  According to his daughter, he has had recurrent falls.  He had been evaluated by Dr. Philip Galloway and apparently she felt his fall risk was greater than potential stroke risk and as result warfarin was discontinued.  She also discontinued diltiazem due to low blood pressure.  Remotely, he was drinking very regularly but has since reduced this.  [facility] is now [facility] [place] [facility] he lives by himself.  He has had episodes of dizziness.  When seen by Marcus Galloway, in August 2024 he reportedly reduced his metoprolol dose from 25 mg twice a day down to 12.5 mg twice a day.  However, on his med list today and still states that he is taking 25 mg.  Resting pulse today is increased in the mid 90s.  He has been waking up in the middle the night with some bursts of increased heartbeat.  If he is taking metoprolol 25 mg twice a day I suggested that he take an extra one half at nighttime.  However if he is taking 12.5 mg twice a day he should then take the whole 25 mg at bedtime.  I am schedule him to undergo a follow-up echo Doppler study in April 2025 and following that evaluation we will schedule him to see how main, Marcus Galloway for repeat assessment.  Since he is 11 years status post AVR, after I retire, I have suggested transitioning his care to Dr. Tonny Galloway for evaluation in [place] next year or sooner as needed.  Medication Adjustments/Labs and Tests Ordered: Current medicines are reviewed at length with the patient today.  Concerns regarding medicines are outlined above.  Medication changes, Labs and Tests ordered today are listed in the Patient Instructions below.  Patient Instructions  Medication Instructions:  Take metoprolol tartrate (LOPRESSOR) 25 MG tablet. Take one whole tablet in th morning. Take one and half tablet at bedtime *If you need a refill on your cardiac medications before your next appointment, please call your pharmacy*   Lab Work: None If you have labs (blood work) drawn today and your tests are completely normal, you will receive your results only by: MyChart  Message (if you have MyChart) OR A paper copy in the mail If you have any lab test that is abnormal or we need to change your treatment, we will call you to review the results.   Testing/Procedures: Your physician has requested that you have an echocardiogram in April 2025. Echocardiography is a painless test that uses sound waves to create images of your heart. It provides your doctor with information about the size and shape of your heart and how well your heart's chambers and valves are working. This procedure takes approximately one hour. There are no restrictions for this procedure. Please do NOT wear cologne, perfume, aftershave, or lotions (deodorant is allowed). Please arrive 15 minutes prior to your appointment time.  Please note: We ask at that you not bring children with you during ultrasound (echo/ vascular) testing. Due to room size and safety concerns, children are not allowed in the ultrasound rooms during exams. Our front office staff cannot provide observation of children in our lobby area while testing is being conducted. An adult accompanying a patient to their appointment will only be allowed in the ultrasound room at the discretion of the  ultrasound technician under special circumstances. We apologize for any inconvenience.    Follow-Up: At [facility], you and your health needs are our priority.  As part of our continuing mission to provide you with exceptional heart care, we have created designated Provider Care Teams.  These Care Teams include your primary Cardiologist (physician) and Advanced Practice Providers (APPs -  Physician Assistants and Nurse Practitioners) who all work together to provide you with the care you need, when you need it.  We recommend signing up for the patient portal called "MyChart".  Sign up information is provided on this After Visit Summary.  MyChart is used to connect with patients for Virtual Visits (Telemedicine).  Patients are able to  view lab/test results, encounter notes, upcoming appointments, etc.  Non-urgent messages can be sent to your provider as well.   To learn more about what you can do with MyChart, go to [email or web address].    Your next appointment:   4 month(s)  Provider:   Azalee Course, Marcus Galloway    Then, Dr. Tonny Galloway will plan to see you again in 10 month(s). {If Card or EP not listed click to update   DO NOT delete brackets or number a  Signed, Marcus Guadalajara, Marcus Galloway, [facility]  10/17/2023 12:30 PM    [facility] [facility] [address], [place], [place]  [address] Phone: [phone]

## 2023-11-15 DIAGNOSIS — H2511 Age-related nuclear cataract, right eye: Secondary | ICD-10-CM | POA: Diagnosis not present

## 2023-11-15 DIAGNOSIS — H2512 Age-related nuclear cataract, left eye: Secondary | ICD-10-CM | POA: Diagnosis not present

## 2023-11-19 ENCOUNTER — Ambulatory Visit: Payer: Medicare Other

## 2023-11-21 ENCOUNTER — Other Ambulatory Visit: Payer: Self-pay | Admitting: Internal Medicine

## 2023-11-21 DIAGNOSIS — N401 Enlarged prostate with lower urinary tract symptoms: Secondary | ICD-10-CM

## 2023-11-25 ENCOUNTER — Ambulatory Visit: Payer: Medicare Other | Admitting: Audiologist

## 2023-11-27 ENCOUNTER — Ambulatory Visit: Payer: Medicare Other | Admitting: Psychology

## 2023-12-03 ENCOUNTER — Ambulatory Visit: Payer: Medicare Other | Admitting: Psychology

## 2023-12-03 DIAGNOSIS — F429 Obsessive-compulsive disorder, unspecified: Secondary | ICD-10-CM

## 2023-12-03 DIAGNOSIS — F4321 Adjustment disorder with depressed mood: Secondary | ICD-10-CM
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
## 2023-12-04 DIAGNOSIS — R2689 Other abnormalities of gait and mobility: Secondary | ICD-10-CM | POA: Diagnosis not present

## 2023-12-04 DIAGNOSIS — R296 Repeated falls: Secondary | ICD-10-CM | POA: Diagnosis not present

## 2023-12-04 DIAGNOSIS — R2681 Unsteadiness on feet: Secondary | ICD-10-CM | POA: Diagnosis not present

## 2023-12-11 DIAGNOSIS — M62512 Muscle wasting and atrophy, not elsewhere classified, left shoulder: Secondary | ICD-10-CM | POA: Diagnosis not present

## 2023-12-11 DIAGNOSIS — R296 Repeated falls: Secondary | ICD-10-CM | POA: Diagnosis not present

## 2023-12-11 DIAGNOSIS — M62511 Muscle wasting and atrophy, not elsewhere classified, right shoulder: Secondary | ICD-10-CM | POA: Diagnosis not present

## 2023-12-11 DIAGNOSIS — R278 Other lack of coordination: Secondary | ICD-10-CM | POA: Diagnosis not present

## 2023-12-17 DIAGNOSIS — R2689 Other abnormalities of gait and mobility: Secondary | ICD-10-CM | POA: Diagnosis not present

## 2023-12-17 DIAGNOSIS — R2681 Unsteadiness on feet: Secondary | ICD-10-CM | POA: Diagnosis not present

## 2023-12-17 DIAGNOSIS — R296 Repeated falls: Secondary | ICD-10-CM | POA: Diagnosis not present

## 2023-12-19 ENCOUNTER — Ambulatory Visit: Payer: Medicare Other | Admitting: Psychology

## 2023-12-19 DIAGNOSIS — M62512 Muscle wasting and atrophy, not elsewhere classified, left shoulder: Secondary | ICD-10-CM | POA: Diagnosis not present

## 2023-12-19 DIAGNOSIS — M62511 Muscle wasting and atrophy, not elsewhere classified, right shoulder: Secondary | ICD-10-CM | POA: Diagnosis not present

## 2023-12-19 DIAGNOSIS — R296 Repeated falls: Secondary | ICD-10-CM | POA: Diagnosis not present

## 2023-12-19 DIAGNOSIS — R278 Other lack of coordination: Secondary | ICD-10-CM | POA: Diagnosis not present

## 2023-12-23 ENCOUNTER — Ambulatory Visit (INDEPENDENT_AMBULATORY_CARE_PROVIDER_SITE_OTHER): Payer: Medicare Other | Admitting: Psychology

## 2023-12-23 DIAGNOSIS — F429 Obsessive-compulsive disorder, unspecified: Secondary | ICD-10-CM

## 2023-12-23 DIAGNOSIS — F4321 Adjustment disorder with depressed mood: Secondary | ICD-10-CM

## 2023-12-23 NOTE — Progress Notes (Signed)
 [facility] Counselor/Therapist Progress Note  Patient ID: Marcus Galloway, MRN: [id number],    Date: 12/23/2023  Time Spent: 10:00am-10:54am  Pt is seen for a virtual video visit via caregility.  Pt joins from his home w/ his daughter, Marcus Galloway his POA and with his permission.  Counselor joins from her home office.  Pt is aware of limitations of such visits and consents to virtual visit.    Treatment Type: Individual Therapy  Reported Symptoms: struggles and frustration w/ memory, wanting to be alone at retirement center.  Worry that will be a burden to family.  Mental Status Exam: Appearance:  Well Groomed     Behavior: Appropriate  Motor: Normal  Speech/Language:  Clear and Coherent and Normal Rate  Affect: Appropriate  Mood: anxious and sad- some worries, frustration w/ memory issues  Thought process: normal and repeats thoughts  Thought content:   WNL  Sensory/Perceptual disturbances:   WNL  Orientation: oriented to person, place, time/date, and situation  Attention: Good  Concentration: Good  Memory: Immediate;   Fair Recent;   Fair Remote;   Good  Fund of knowledge:  Good  Insight:   Good  Judgment:  Good  Impulse Control: Good   Risk Assessment: Danger to Self:  No Self-injurious Behavior: No Danger to Others: No Duty to Warn:no Physical Aggression / Violence:No  Access to Firearms a concern: No  Gang Involvement:No   Subjective: counselor assessed pt current functioning per pt report. Disucssed goals and tx plan. Processed w/ pt and daughter current functioning.  Explored interactions and barriers to interactions.  Encouraged engagement to assist w/ health.  Pt affect wnl.  Pt reports he finds himself frustrated w/ memory and feeling confused.  Pt reports he also feels struggle w/ wanting to talk w/ others at the assisted living.  Pt stresses that he wants to be left alone.  Pt recognizes this is a change from his past.  Daughter reports he still  engages w/ family and doesn't isolate.  Pt discussed not wanting to be a burden to family.     Interventions: Cognitive Behavioral Therapy and Psycho-education/Bibliotherapy and supportive  Diagnosis:Obsessive-compulsive disorder, unspecified type  Adjustment disorder with depressed mood  Plan: pt to f/u w/ counseling 1-2 times a month.  Pt to f/u as scheduled w/ PCP.    Individualized Treatment Plan Strengths: enjoys watching t.v., enjoys feeding the birds  Supports: his children.  Pt reports most support from daughter [name].   Goal/Needs for Treatment:  In order of importance to patient 1) cope w/ cognitive decline 2) decrease down moods and ocd behaviors    Client Statement of Needs: learn how to deal w/ changes.     Treatment Level:outpatient counseling  Symptoms:  feeling bad about self/down/frustrated w/ cognitive challenges.  Increased obsessing and difficulty not fixating on one thing  Client Treatment Preferences:outpatient counseling in person.   Healthcare consumer's goal for treatment:  Counselor, Forde Radon, [facility] will support the patient's ability to achieve the goals identified. Cognitive Behavioral Therapy, Assertive Communication/Conflict Resolution Training, Relaxation Training, ACT, Humanistic and other evidenced-based practices will be used to promote progress towards healthy functioning.   Healthcare consumer will: Actively participate in therapy, working towards healthy functioning.    *Justification for Continuation/Discontinuation of Goal: R=Revised, O=Ongoing, A=Achieved, D=Discontinued  Goal 1) Assist pt in expressing feelings to cope and work towards acceptance of changes in functioning w/ health and cognitive decline AEB pt report, daughter's report and therapist observation.   Baseline date  12/23/23: Progress towards goal 0; How Often - Daily Target Date Goal Was reviewed Status Code Progress towards goal/Likert rating  12/22/24                 Goal 2) Increase coping w/ stressors to reduce OCD behaviors and depression symptoms AEB pt report and therapist observation.   Baseline date 12/23/23: Progress towards goal 0; How Often - Daily Target Date Goal Was reviewed Status Code Progress towards goal  12/22/24                This plan has been reviewed and created by the following participants:  This plan will be reviewed at least every 12 months. Date Behavioral Health Clinician Date Guardian/Patient   12/23/23  [facility] Ophelia Charter [facility] 12/23/23 Verbal Consent Provided                    Forde Radon [facility]

## 2024-01-08 ENCOUNTER — Ambulatory Visit: Payer: Medicare Other | Attending: Internal Medicine | Admitting: Audiologist

## 2024-01-08 DIAGNOSIS — R4189 Other symptoms and signs involving cognitive functions and awareness: Secondary | ICD-10-CM | POA: Diagnosis present

## 2024-01-08 DIAGNOSIS — H903 Sensorineural hearing loss, bilateral: Secondary | ICD-10-CM | POA: Insufficient documentation

## 2024-01-08 NOTE — Procedures (Signed)
  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 [name] was accompanied to today's appointment by his daughter who supplement case history. [name] stated that he does not perceive one ear to be better than the other and denies any aural pain or fullness. [name] reported occasional tinnitus but it is not bothersome. 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 [name] also has a diagnosis of dementia.   Evaluation:  Otoscopy showed a clear view of the tympanic membranes, bilaterally Tympanometry results were consistent with normal movement (Type A) of the tympanic membranes bilaterally Audiometric testing was completed using Conventional Audiometry techniques with TDH headphones. Test results are consistent with mild sloping to profound sensorineural hearing loss bilaterally. Speech Recognition Thresholds were obtained at 30 dB HL in the right ear and at 35  dB HL in the left ear. Word Recognition Testing was completed at 70 dB HL in the right ear and [name] scored 96% and 80 dB HL in the left ear and scored 76%.   Results:  The test results were reviewed with [name] and his daughter. [name] has a mild sloping to profound sensorineural hearing loss in both ears. [name] could benefit from consistent use of hearing aids in both ears. [name] and his daughter were counseled on [employer] hearing aid benefit as well as  the option to complete a hearing aid trial.  A phone number to call [employer] for an in network hearing aid provider was given to [name] and his daughter if he wishes to pursue hearing aids.    Recommendations: 1.   Hearing aid trial per patient discretion 2.  Annual monitoring of hearing sensitivity, or sooner if any concerns arise     40 minutes spent testing and counseling on results.   If you have any questions please feel free to contact me at [phone].  Ammie Ferrier  Audiologist, Au.D., CCC-A 01/08/2024  10:48 AM  [name] Si B.S.  Audiology Student   During this evaluation, the Audiologist was present, participating in and directing the student.  I agree with the following procedure note after reviewing documentation. This session was performed under the supervision of a licensed clinician.  During this session, the Audiologist  was present, participating in and directing the treatment.   Cc: Philip Aspen, Limmie Patricia, MD

## 2024-01-14 ENCOUNTER — Other Ambulatory Visit: Payer: Self-pay | Admitting: Internal Medicine

## 2024-01-14 DIAGNOSIS — E559 Vitamin D deficiency, unspecified: Secondary | ICD-10-CM

## 2024-01-16 ENCOUNTER — Ambulatory Visit: Payer: Medicare Other | Admitting: Psychology

## 2024-01-16 DIAGNOSIS — F4321 Adjustment disorder with depressed mood: Secondary | ICD-10-CM | POA: Diagnosis not present

## 2024-01-16 DIAGNOSIS — F429 Obsessive-compulsive disorder, unspecified: Secondary | ICD-10-CM | POA: Diagnosis not present

## 2024-01-16 NOTE — Progress Notes (Signed)
 [facility] Counselor/Therapist Progress Note  Patient ID: Marcus Galloway, MRN: [id number],    Date: 01/16/2024  Time Spent: 11:02am-12:02pm  Pt is seen for in person visit.    Pt joined by his daughter, [name] his POA and with his permission for first 5 minutes.       Treatment Type: Individual Therapy  Reported Symptoms: struggles and frustration w/ memory, increased vivid dreams and nightmares past several days.    Mental Status Exam: Appearance:  Well Groomed     Behavior: Appropriate  Motor: Normal  Speech/Language:  Clear and Coherent and Normal Rate  Affect: Appropriate  Mood: anxious and sad- some worries, frustration w/ memory issues  Thought process: normal and repeats thoughts  Thought content:   WNL  Sensory/Perceptual disturbances:   WNL  Orientation: oriented to person, place, time/date, and situation  Attention: Good  Concentration: Good  Memory: Immediate;   Fair Recent;   Fair Remote;   Good  Fund of knowledge:  Good  Insight:   Good  Judgment:  Good  Impulse Control: Good   Risk Assessment: Danger to Self:  No Self-injurious Behavior: No Danger to Others: No Duty to Warn:no Physical Aggression / Violence:No  Access to Firearms a concern: No  Gang Involvement:No   Subjective: counselor assessed pt current functioning per pt and daughter's report. Processed w/ pt current functioning.  Validated frustrations w current memory struggles.  Discussed pt gratitude expressed and acknowledge life hardships. Pt affect wnl. Daughter reports had hearing exam and has hearing loss for high pitched noises and not good match for hearing device that would be needed to correct.  Pt reports frustration w/ memory loss and being able to picture something but not remember it. Pt discussed difficulty w/ sleep last night and disturbing nightmare.  Pt discussed how having more vivid dreams recent.  Pt reports on feeling very fortunate in life in what he  accomplished started from bad situation and that had wonderful family and relationship in adult life.  Pt recognizes this as important to him.  Pt reflects that beneficial to be able to share his thoughts and get things expressed in session.   Interventions: Cognitive Behavioral Therapy and Psycho-education/Bibliotherapy and supportive  Diagnosis:Obsessive-compulsive disorder, unspecified type  Adjustment disorder with depressed mood  Plan: pt to f/u w/ counseling 1-2 times a month.  Pt to f/u as scheduled w/ PCP.    Individualized Treatment Plan Strengths: enjoys watching t.v., enjoys feeding the birds  Supports: his children.  Pt reports most support from daughter [name].   Goal/Needs for Treatment:  In order of importance to patient 1) cope w/ cognitive decline 2) decrease down moods and ocd behaviors    Client Statement of Needs: learn how to deal w/ changes.     Treatment Level:outpatient counseling  Symptoms:  feeling bad about self/down/frustrated w/ cognitive challenges.  Increased obsessing and difficulty not fixating on one thing  Client Treatment Preferences:outpatient counseling in person.   Healthcare consumer's goal for treatment:  Counselor, Forde Radon, [facility] will support the patient's ability to achieve the goals identified. Cognitive Behavioral Therapy, Assertive Communication/Conflict Resolution Training, Relaxation Training, ACT, Humanistic and other evidenced-based practices will be used to promote progress towards healthy functioning.   Healthcare consumer will: Actively participate in therapy, working towards healthy functioning.    *Justification for Continuation/Discontinuation of Goal: R=Revised, O=Ongoing, A=Achieved, D=Discontinued  Goal 1) Assist pt in expressing feelings to cope and work towards acceptance of changes in functioning w/ health and  cognitive decline AEB pt report, daughter's report and therapist observation.   Baseline date 12/23/23:  Progress towards goal 0; How Often - Daily Target Date Goal Was reviewed Status Code Progress towards goal/Likert rating  12/22/24                Goal 2) Increase coping w/ stressors to reduce OCD behaviors and depression symptoms AEB pt report and therapist observation.   Baseline date 12/23/23: Progress towards goal 0; How Often - Daily Target Date Goal Was reviewed Status Code Progress towards goal  12/22/24                This plan has been reviewed and created by the following participants:  This plan will be reviewed at least every 12 months. Date Behavioral Health Clinician Date Guardian/Patient   12/23/23  [facility] Ophelia Charter [facility] 12/23/23 Verbal Consent Provided                      Forde Radon [facility]

## 2024-02-11 ENCOUNTER — Ambulatory Visit (HOSPITAL_COMMUNITY): Payer: Medicare Other | Attending: Cardiology

## 2024-02-11 DIAGNOSIS — I4821 Permanent atrial fibrillation: Secondary | ICD-10-CM | POA: Diagnosis not present

## 2024-02-11 DIAGNOSIS — E785 Hyperlipidemia, unspecified: Secondary | ICD-10-CM | POA: Insufficient documentation

## 2024-02-11 DIAGNOSIS — Z952 Presence of prosthetic heart valve: Secondary | ICD-10-CM | POA: Insufficient documentation

## 2024-02-11 DIAGNOSIS — I10 Essential (primary) hypertension: Secondary | ICD-10-CM | POA: Diagnosis not present

## 2024-02-11 LAB — ECHOCARDIOGRAM COMPLETE
AR max vel: 0.66 cm2
AV Area VTI: 0.68 cm2
AV Area mean vel: 0.66 cm2
AV Mean grad: 29 mmHg
AV Peak grad: 52.1 mmHg
Ao pk vel: 3.61 m/s
Area-P 1/2: 5.58 cm2
MV M vel: 5.56 m/s
MV Peak grad: 123.7 mmHg
P 1/2 time: 569 ms
S' Lateral: 1.9 cm

## 2024-02-13 ENCOUNTER — Ambulatory Visit: Payer: Medicare Other | Attending: Physician Assistant | Admitting: Physician Assistant

## 2024-02-13 VITALS — BP 92/50 | HR 53 | Ht 70.0 in | Wt 161.0 lb

## 2024-02-13 DIAGNOSIS — I4821 Permanent atrial fibrillation: Secondary | ICD-10-CM | POA: Diagnosis not present

## 2024-02-13 DIAGNOSIS — I959 Hypotension, unspecified: Secondary | ICD-10-CM

## 2024-02-13 DIAGNOSIS — F02B0 Dementia in other diseases classified elsewhere, moderate, without behavioral disturbance, psychotic disturbance, mood disturbance, and anxiety: Secondary | ICD-10-CM

## 2024-02-13 DIAGNOSIS — Z952 Presence of prosthetic heart valve: Secondary | ICD-10-CM | POA: Diagnosis not present

## 2024-02-13 DIAGNOSIS — I350 Nonrheumatic aortic (valve) stenosis: Secondary | ICD-10-CM

## 2024-02-13 DIAGNOSIS — G309 Alzheimer's disease, unspecified: Secondary | ICD-10-CM

## 2024-02-13 NOTE — Progress Notes (Signed)
 Cardiology Office Note:  .   Date:  02/13/2024  ID:  Marcus Galloway, DOB 12-Jul-1941, MRN [id number] PCP: Zilphia Hilt, Charyl Coppersmith, MD  [facility] Providers Cardiologist:  Magnus Schuller, MD     History of Present Illness: .   Marcus Galloway is a 83 y.o. 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  Patient was initially diagnosed with atrial fibrillation during routine office visit in November 2017.  She was placed on anticoagulation therapy and rate control therapy and underwent cardioversion.  He developed a recurrent A-fib and I had to be loaded on sotalol and subsequent repeat cardioversion.  Echocardiogram obtained at the time showed EF 50 to 55%, biatrial enlargement, mild to moderate MR, moderate TR, normal prosthetic aortic valve.  He moved here from [facility].  He was previously followed by Dr. Lauren Ponds at [facility].  When he was seen back by his cardiologist again in February 2018, he was back in atrial fibrillation.  At the time, he was off of both sotalol and Eliquis.  He was started on rate control medication metoprolol  tartrate and Coumadin .  After he moved to [place], he established with Dr. Loetta Ringer.  He did mention to Dr. Loetta Ringer that prior to his valve replacement surgery, he underwent cardiac catheterization and he had a clean coronary arteries.  Echocardiogram in April 2018 showed EF 60 to 65%, no regional wall motion abnormality, well-seated bioprosthetic aortic valve with trivial regurgitation, severe biatrial enlargement.  Rate control strategy was pursued for his atrial fibrillation.  Echocardiogram obtained on 08/04/2020 showed EF 55 to 60% with LVH, well-seated 25 mm bioprosthetic aortic valve, no significant aortic regurgitation, severe biatrial enlargement. After extensive discussion with his PCP, he ultimately  came off of Coumadin . Dr. Loetta Ringer started him on 81 mg aspirin . Cardizem  was also discontinued due to low blood pressure.  He was having progressive dementia.  I previously reduced his metoprolol  in August 2024 due to dizziness.  He currently resides at [facility].  Last echocardiogram obtained on 06/28/2023 showed EF 60 to 65%, mild LVH, mildly enlarged RV, mildly elevated PASP, severe biatrial enlargement, mild MR, trivial AI, moderate aortic stenosis was 25 mm bioprosthetic aortic valve present, dilated ascending aorta measuring 43 mm.  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  Dr. Loetta Ringer has recommended the patient to follow-up with Dr. Arlester Ladd after his retirement.  Patient presented today accompanied by daughter.  He continued to have dizziness.  Blood pressure is low.  I have reviewed with the patient his recent echocardiogram which suggest moderate to severe bioprosthetic aortic valve with paradoxical low-flow low gradient.  Per Dr. Arlana Bellini recommendation, patient will establish with Dr. Arlester Ladd as his primary cardiologist in the future.  His heart rate is slowly about 48 bpm while he is in permanent A-fib, we will discontinue his metoprolol  completely.  Patient is unlikely to be a candidate for traditional AVR again, however may be a candidate for TAVR +/- Watchman.  ROS:   He denies chest pain, palpitations, dyspnea, pnd, orthopnea, n, v, dizziness, syncope, edema, weight gain, or early satiety. All other systems reviewed and are otherwise negative except as noted above.  Studies Reviewed: .        Cardiac Studies & Procedures    ______________________________________________________________________________________________     ECHOCARDIOGRAM  ECHOCARDIOGRAM COMPLETE 02/11/2024  Narrative ECHOCARDIOGRAM REPORT    Patient Name:   Marcus Galloway Date of Exam: 02/11/2024 Medical Rec #:  [id number]     Height:       68.0 in Accession #:    [id number]    Weight:       156.2 lb Date of Birth:  10-12-1941     BSA:          1.840 m Patient Age:    82 years      BP:           102/62 mmHg Patient Gender: M             HR:           61 bpm. Exam Location:  [address]  Procedure: 2D Echo, Cardiac Doppler and Color Doppler (Both Spectral and Color Flow Doppler were utilized during procedure).  Indications:    I48.21 Atrial Fibrillation  History:        Patient has prior history of Echocardiogram examinations. Pulmonary HTN, Aortic Valve Disease, Signs/Symptoms:Dizziness/Lightheadedness, Fatigue, Shortness of Breath, Dyspnea and Altered Mental Status; Risk Factors:Hypertension, Dyslipidemia, Family History of Coronary Artery Disease and Former Smoker. Aortic Valve Replacement (2013, 25mm Bioprosthetic), Post-Polio Syndrome. Aortic Valve: 25 mm bioprosthetic valve is present in the aortic position.  Sonographer:    Ewing Holiday RDCS Referring Phys: Millicent Ally  IMPRESSIONS   1. Left ventricular ejection fraction, by estimation, is 60 to 65%. The left ventricle has normal function. The left ventricle has no regional wall motion abnormalities. Left ventricular diastolic function could not be evaluated. 2. Right ventricular systolic function is normal. The right ventricular size is mildly enlarged. There is normal pulmonary artery systolic pressure. The estimated right ventricular systolic pressure is 29.0 mmHg. 3. Left atrial size was massively dilated. 4. Right atrial size was massively dilated. 5. The mitral valve is degenerative. Mild mitral valve regurgitation. No evidence of mitral stenosis. 6.  Tricuspid valve regurgitation is mild to moderate. 7. The aortic valve has been repaired/replaced. There is moderate calcification of the aortic valve. There is moderate thickening of the aortic valve. Aortic valve regurgitation is trivial. Moderate aortic valve stenosis. There is a 25 mm bioprosthetic valve present in the aortic position. Aortic regurgitation PHT measures 569 msec. Aortic valve mean gradient measures 29.0 mmHg. Aortic valve Vmax measures 3.61 m/s. The DI is low at 0.21 and SVI low at 33. 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 8. Aortic dilatation noted. There is mild dilatation of the ascending aorta, measuring 41 mm. 9. The inferior vena cava is normal in size with greater than 50% respiratory variability, suggesting right atrial pressure of 3 mmHg. 10. Compared to study dated 06/28/23, the mean bioprosthetic AVG has increased from 28 to , DI has decreased from 0.29 to 0.21 and AVA has decreased from 1.25cm2 to 0.66cm2 (VTI). Consider TEE for further assessment.  FINDINGS Left Ventricle: Left ventricular ejection fraction, by estimation, is 60 to 65%. The left ventricle has normal function. The left ventricle has no regional wall motion abnormalities. The left ventricular internal cavity size was normal in size. There is no left ventricular hypertrophy. Left ventricular diastolic function could not be evaluated due to atrial fibrillation. Left ventricular diastolic function could not be  evaluated.  Right Ventricle: The right ventricular size is mildly enlarged. No increase in right ventricular wall thickness. Right ventricular systolic function is normal. There is normal pulmonary artery systolic pressure. The tricuspid regurgitant velocity is 2.55 m/s, and with an assumed right atrial pressure of 3 mmHg, the estimated right ventricular systolic pressure is 29.0 mmHg.  Left  Atrium: Left atrial size was massively dilated.  Right Atrium: Right atrial size was massively dilated.  Pericardium: There is no evidence of pericardial effusion.  Mitral Valve: The mitral valve is degenerative in appearance. There is moderate thickening of the mitral valve leaflet(s). Mild to moderate mitral annular calcification. Mild mitral valve regurgitation. No evidence of mitral valve stenosis.  Tricuspid Valve: The tricuspid valve is normal in structure. Tricuspid valve regurgitation is mild to moderate. No evidence of tricuspid stenosis.  Aortic Valve: The aortic valve has been repaired/replaced. There is moderate calcification of the aortic valve. There is moderate thickening of the aortic valve. Aortic valve regurgitation is trivial. Aortic regurgitation PHT measures 569 msec. Moderate aortic stenosis is present. Aortic valve mean gradient measures 29.0 mmHg. Aortic valve peak gradient measures 52.1 mmHg. Aortic valve area, by VTI measures 0.68 cm. There is a 25 mm bioprosthetic valve present in the aortic position.  Pulmonic Valve: The pulmonic valve was normal in structure. Pulmonic valve regurgitation is not visualized. No evidence of pulmonic stenosis.  Aorta: Aortic dilatation noted. There is mild dilatation of the ascending aorta, measuring 41 mm.  Venous: The inferior vena cava is normal in size with greater than 50% respiratory variability, suggesting right atrial pressure of 3 mmHg.  IAS/Shunts: No atrial level shunt detected by color flow Doppler.   LEFT VENTRICLE PLAX 2D LVIDd:         3.60 cm   Diastology LVIDs:         1.90 cm   LV e' medial:    7.94 cm/s LV PW:         1.00 cm   LV E/e' medial:  10.1 LV IVS:        1.10 cm   LV e' lateral:   12.65 cm/s LVOT diam:     2.05 cm   LV E/e' lateral: 6.4 LV SV:         60 LV SV Index:   33 LVOT Area:     3.30 cm   RIGHT VENTRICLE RV Basal diam:  4.30 cm RV Mid diam:    2.60 cm RV S prime:     9.48 cm/s TAPSE  (M-mode): 2.1 cm RVSP:           29.0 mmHg  LEFT ATRIUM              Index        RIGHT ATRIUM           Index LA diam:        6.20 cm  3.37 cm/m   RA Pressure: 3.00 mmHg LA Vol (A2C):   179.0 ml 97.29 ml/m  RA Area:     33.40 cm LA Vol (A4C):   156.0 ml 84.79 ml/m  RA Volume:   128.00 ml 69.57 ml/m LA Biplane Vol: 173.0 ml 94.02 ml/m AORTIC VALVE AV Area (Vmax):    0.66 cm AV Area (Vmean):   0.66 cm AV Area (VTI):     0.68 cm AV Vmax:           361.00 cm/s AV Vmean:  249.000 cm/s AV VTI:            0.891 m AV Peak Grad:      52.1 mmHg AV Mean Grad:      29.0 mmHg LVOT Vmax:         71.80 cm/s LVOT Vmean:        49.633 cm/s LVOT VTI:          0.183 m LVOT/AV VTI ratio: 0.21 AI PHT:            569 msec  AORTA Ao Root diam: 3.30 cm Ao Asc diam:  4.05 cm  MITRAL VALVE               TRICUSPID VALVE MV Area (PHT): cm         TR Peak grad:   26.0 mmHg MV Decel Time: 136 msec    TR Vmax:        255.00 cm/s MR Peak grad: 123.7 mmHg   Estimated RAP:  3.00 mmHg MR Mean grad: 72.0 mmHg    RVSP:           29.0 mmHg MR Vmax:      556.00 cm/s MR Vmean:     394.0 cm/s   SHUNTS MV E velocity: 80.47 cm/s  Systemic VTI:  0.18 m MV A velocity: 20.53 cm/s  Systemic Diam: 2.05 cm MV E/A ratio:  3.92  Gaylyn Keas MD Electronically signed by Gaylyn Keas MD Signature Date/Time: 02/11/2024/1:16:42 PM    Final          ______________________________________________________________________________________________      Risk Assessment/Calculations:    CHA2DS2-VASc Score = 3   This indicates a 3.2% annual risk of stroke. The patient's score is based upon: CHF History: 0 HTN History: 1 Diabetes History: 0 Stroke History: 0 Vascular Disease History: 0 Age Score: 2 Gender Score: 0            Physical Exam:   VS:  BP (!) 92/50 (BP Location: Left Arm, Patient Position: Sitting, Cuff Size: Normal)   Pulse (!) 53   Ht 5\' 10"  (1.778 m)   Wt 161 lb (73 kg)    SpO2 98%   BMI 23.10 kg/m    Wt Readings from Last 3 Encounters:  02/13/24 161 lb (73 kg)  10/15/23 156 lb 3.2 oz (70.9 kg)  08/08/23 157 lb 6.4 oz (71.4 kg)    GEN: Well nourished, well developed in no acute distress NECK: No JVD; No carotid bruits CARDIAC: Irregularly irregular, no murmurs, rubs, gallops RESPIRATORY:  Clear to auscultation without rales, wheezing or rhonchi  ABDOMEN: Soft, non-tender, non-distended EXTREMITIES:  No edema; No deformity   ASSESSMENT AND PLAN: .    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 Recent echocardiogram indicates progression. Not a candidate for traditional replacement due to age and surgical history. TAVR considered due to minimally invasive nature. - Refer to Dr. Arnoldo Lapping at the structural heart clinic for TAVR evaluation. - Schedule follow-up with Dr. Michael Cooper as early as available.  History of AVR: See #1, there is evidence of moderate to severe bioprosthetic aortic valve stenosis.  Permanent atrial fibrillation Permanent atrial fibrillation with bradycardia at 48 bpm. Over-control likely due to metoprolol . - Discontinue metoprolol  completely.  Hypotension Persistent dizziness likely related to hypotension and metoprolol  use. Blood pressure in the 90s and heart rate at 48 bpm. - Discontinue metoprolol  completely. - Repeat EKG to assess current heart rate.  Alzheimer's disease: At least moderate Alzheimer's disease, not a candidate for surgery, however may be a candidate for TAVR procedure.       Dispo: Established with Dr. Arlester Ladd  Signed, Aubree Doody, [place]

## 2024-02-13 NOTE — Patient Instructions (Signed)
 Medication Instructions:  DISCONTINUED METOPROLOL  *If you need a refill on your cardiac medications before your next appointment, please call your pharmacy*  Lab Work: NO LABS If you have labs (blood work) drawn today and your tests are completely normal, you will receive your results only by: MyChart Message (if you have MyChart) OR A paper copy in the mail If you have any lab test that is abnormal or we need to change your treatment, we will call you to review the results.  Testing/Procedures: NO TESTING  Follow-Up: At [facility], you and your health needs are our priority.  As part of our continuing mission to provide you with exceptional heart care, our providers are all part of one team.  This team includes your primary Cardiologist (physician) and Advanced Practice Providers or APPs (Physician Assistants and Nurse Practitioners) who all work together to provide you with the care you need, when you need it.  Your next appointment:   FIRST AVAILABLE   Provider:   Arnoldo Lapping, MD  Other Instructions You have been referred to Structural Heart/ Valve Clinic with Dr. Arnoldo Lapping       1st Floor: - Lobby - Registration  - Pharmacy  - Lab - Cafe  2nd Floor: - PV Lab - Diagnostic Testing (echo, CT, nuclear med)  3rd Floor: - Vacant  4th Floor: - TCTS (cardiothoracic surgery) - AFib Clinic - Structural Heart Clinic - Vascular Surgery  - Vascular Ultrasound  5th Floor: - HeartCare Cardiology (general and EP) - Clinical Pharmacy for coumadin, hypertension, lipid, weight-loss medications, and med management appointments    Valet parking services will be available as well.

## 2024-02-20 ENCOUNTER — Ambulatory Visit: Admitting: Psychology

## 2024-02-20 DIAGNOSIS — F429 Obsessive-compulsive disorder, unspecified: Secondary | ICD-10-CM

## 2024-02-20 DIAGNOSIS — F4321 Adjustment disorder with depressed mood: Secondary | ICD-10-CM

## 2024-02-20 NOTE — Progress Notes (Signed)
 [facility] Counselor/Therapist Progress Note  Patient ID: Marcus Galloway, MRN: [id number],    Date: 02/20/2024  Time Spent: 11:00am-11:50am  Pt is seen for in person visit.    Pt brought by his daughter, Daryll Epp his POA.      Treatment Type: Individual Therapy  Reported Symptoms: pt reports tired of pain and memory issues.  Mental Status Exam: Appearance:  Well Groomed     Behavior: Appropriate  Motor: Normal  Speech/Language:  Clear and Coherent and Normal Rate  Affect: Appropriate  Mood: sad and frustrated -  Thought process: normal and repeats thoughts  Thought content:   WNL  Sensory/Perceptual disturbances:   WNL  Orientation: oriented to person, place, time/date, and situation  Attention: Good  Concentration: Good  Memory: Immediate;   Fair Recent;   Fair Remote;   Good  Fund of knowledge:  Good  Insight:   Good  Judgment:  Good  Impulse Control: Good   Risk Assessment: Danger to Self:  No  Pt reports ready for death.  Pt denies wanting to end his life or any plans. Self-injurious Behavior: No Danger to Others: No Duty to Warn:no Physical Aggression / Violence:No  Access to Firearms a concern: No  Gang Involvement:No   Subjective: counselor assessed pt current functioning per pt report. Processed w/ pt current struggles.  Validated emotions w/ aging struggles. Assessed pt for SI.  Explored w/pt connections that are important to him and keeping in contact.  Pt affect congruent w/ reports of frustrations and fatigue of pain and memory struggles. Pt reports "I'm sick and tired of being sick and tired.  Pt discussed how he is ready to die and not fearing death.  Pt reports he doesn't want to end life as values family.  Pt denies any intent or plan.  Pt discussed how he is in touch w/ friend and that positive to talk with.  Pt shared that helpful to talk in counseling as feels that not judged and not worried that burdening.    Interventions: Cognitive  Behavioral Therapy and supportive  Diagnosis:Obsessive-compulsive disorder, unspecified type  Adjustment disorder with depressed mood  Plan: pt to f/u w/ counseling 1-2 times a month.  Pt to f/u as scheduled w/ PCP.    Individualized Treatment Plan Strengths: enjoys watching t.v., enjoys feeding the birds  Supports: his children.  Pt reports most support from daughter Odilia Bennett.   Goal/Needs for Treatment:  In order of importance to patient 1) cope w/ cognitive decline 2) decrease down moods and ocd behaviors    Client Statement of Needs: learn how to deal w/ changes.     Treatment Level:outpatient counseling  Symptoms:  feeling bad about self/down/frustrated w/ cognitive challenges.  Increased obsessing and difficulty not fixating on one thing  Client Treatment Preferences:outpatient counseling in person.   Healthcare consumer's goal for treatment:  Counselor, Clydie Darter, [facility] will support the patient's ability to achieve the goals identified. Cognitive Behavioral Therapy, Assertive Communication/Conflict Resolution Training, Relaxation Training, ACT, Humanistic and other evidenced-based practices will be used to promote progress towards healthy functioning.   Healthcare consumer will: Actively participate in therapy, working towards healthy functioning.    *Justification for Continuation/Discontinuation of Goal: R=Revised, O=Ongoing, A=Achieved, D=Discontinued  Goal 1) Assist pt in expressing feelings to cope and work towards acceptance of changes in functioning w/ health and cognitive decline AEB pt report, daughter's report and therapist observation.   Baseline date 12/23/23: Progress towards goal 0; How Often - Daily Target Date  Goal Was reviewed Status Code Progress towards goal/Likert rating  12/22/24                Goal 2) Increase coping w/ stressors to reduce OCD behaviors and depression symptoms AEB pt report and therapist observation.   Baseline date 12/23/23:  Progress towards goal 0; How Often - Daily Target Date Goal Was reviewed Status Code Progress towards goal  12/22/24                This plan has been reviewed and created by the following participants:  This plan will be reviewed at least every 12 months. Date Behavioral Health Clinician Date Guardian/Patient   12/23/23  [facility] Murrel Arnt [facility] 12/23/23 Verbal Consent Provided                      Clydie Darter LCMHC

## 2024-03-24 ENCOUNTER — Telehealth: Payer: Self-pay

## 2024-03-24 NOTE — Telephone Encounter (Signed)
 The pt's daughter, Odilia Bennett, called into the office and spoke with scheduling staff to cancel 6/9 TAVR consult with Dr Arlester Ladd.  She states that the patient doesn't want to have anything done and she wasn't going to push him to do it. Appointment has been cancelled.

## 2024-03-26 ENCOUNTER — Ambulatory Visit: Admitting: Psychology

## 2024-04-06 ENCOUNTER — Institutional Professional Consult (permissible substitution): Admitting: Cardiovascular Disease

## 2024-04-23 ENCOUNTER — Other Ambulatory Visit: Payer: Self-pay | Admitting: Internal Medicine

## 2024-04-23 DIAGNOSIS — F321 Major depressive disorder, single episode, moderate: Secondary | ICD-10-CM

## 2024-04-23 DIAGNOSIS — E781 Pure hyperglyceridemia: Secondary | ICD-10-CM

## 2024-05-02 ENCOUNTER — Other Ambulatory Visit: Payer: Self-pay | Admitting: Internal Medicine

## 2024-05-02 DIAGNOSIS — I10 Essential (primary) hypertension: Secondary | ICD-10-CM
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
## 2024-05-07 ENCOUNTER — Ambulatory Visit: Payer: Medicare Other | Admitting: Neurology

## 2024-05-07 ENCOUNTER — Encounter: Payer: Self-pay | Admitting: Neurology

## 2024-05-07 VITALS — BP 98/62 | HR 90 | Ht 70.0 in | Wt 162.6 lb

## 2024-05-07 DIAGNOSIS — F0390 Unspecified dementia without behavioral disturbance: Secondary | ICD-10-CM

## 2024-05-07 DIAGNOSIS — I680 Cerebral amyloid angiopathy: Secondary | ICD-10-CM | POA: Diagnosis not present

## 2024-05-07 DIAGNOSIS — I959 Hypotension, unspecified: Secondary | ICD-10-CM

## 2024-05-07 NOTE — Patient Instructions (Signed)
 Donepezil  and memantine  Uses shower seat

## 2024-05-11 DIAGNOSIS — S63501A Unspecified sprain of right wrist, initial encounter: Secondary | ICD-10-CM | POA: Diagnosis not present

## 2024-05-23 ENCOUNTER — Other Ambulatory Visit: Payer: Self-pay | Admitting: Internal Medicine

## 2024-05-23 DIAGNOSIS — N401 Enlarged prostate with lower urinary tract symptoms: Secondary | ICD-10-CM

## 2024-05-25 DIAGNOSIS — S63501A Unspecified sprain of right wrist, initial encounter: Secondary | ICD-10-CM | POA: Diagnosis not present

## 2024-10-01 ENCOUNTER — Ambulatory Visit: Admitting: Internal Medicine

## 2024-10-13 ENCOUNTER — Encounter: Payer: Self-pay | Admitting: Family Medicine

## 2024-10-13 ENCOUNTER — Ambulatory Visit: Admitting: Family Medicine

## 2024-10-13 DIAGNOSIS — Z0000 Encounter for general adult medical examination without abnormal findings: Secondary | ICD-10-CM

## 2024-10-13 NOTE — Progress Notes (Signed)
 Chief Complaint  Patient presents with   Medicare Wellness     Subjective:   Marcus Galloway is a 83 y.o. male who presents for a Medicare Annual Wellness Visit.Reports is stable. Denies anything new. Does deal with chronic arthritis pains and discomfort related to his health issues. Has discussed with his doctors and is ok with how things are as has opted not to undergo and further interventions or take more medications. He quit drinking 1 year ago. He walks daily about 1 mile and this has really helped.    Visit info / Clinical Intake: Medicare Wellness Visit Type:: Subsequent Annual Wellness Visit Persons participating in visit and providing information:: patient Medicare Wellness Visit Mode:: Video Since this visit was completed virtually, some vitals may be partially provided or unavailable. Missing vitals are due to the limitations of the virtual format.: Unable to obtain vitals - no equipment If Telephone or Video please confirm:: I connected with patient using audio/video enable telemedicine. I verified patient identity with two identifiers, discussed telehealth limitations, and patient agreed to proceed. Interpreter Needed?: No Pre-visit prep was completed: no AWV questionnaire completed by patient prior to visit?: no Living arrangements:: (!) lives alone Patient's Overall Health Status Rating: (!) poor Typical amount of pain: (!) a lot Does pain affect daily life?: (!) yes Are you currently prescribed opioids?: no  Dietary Habits and Nutritional Risks How many meals a day?: 2 Eats fruit and vegetables daily?: yes Most meals are obtained by: having others provide food In the last 2 weeks, have you had any of the following?: none Diabetic:: no  Functional Status Activities of Daily Living (to include ambulation/medication): (!) Needs Assist Feeding: Independent Dressing/Grooming: Independent Bathing: Independent Toileting: Independent Transfer: Independent Ambulation:  Independent with device- listed below Home Assistive Devices/Equipment: Cane Medication Administration: Independent Home Management (perform basic housework or laundry): Needs assistance (comment) Manage your own finances?: yes Primary transportation is: family / friends Concerns about vision?: (!) yes (glasses) Concerns about hearing?: (!) yes Uses hearing aids?: no  Fall Screening Falls in the past year?: 1 Number of falls in past year: 0 Was there an injury with Fall?: 1 Fall Risk Category Calculator: 2 Patient Fall Risk Level: Moderate Fall Risk  Fall Risk Patient at Risk for Falls Due to: History of fall(s) Fall risk Follow up: Falls evaluation completed  Home and Transportation Safety: All rugs have non-skid backing?: yes All stairs or steps have railings?: N/A, no stairs Grab bars in the bathtub or shower?: yes Have non-skid surface in bathtub or shower?: yes Good home lighting?: yes Regular seat belt use?: yes Hospital stays in the last year:: no  Cognitive Assessment Difficulty concentrating, remembering, or making decisions? : yes Will 6CIT or Mini Cog be Completed: yes What year is it?: 0 points What month is it?: 0 points Give patient an address phrase to remember (5 components): the cow jumped over the moon About what time is it?: 0 points Count backwards from 20 to 1: 0 points Say the months of the year in reverse: 4 points Repeat the address phrase from earlier: 10 points 6 CIT Score: 14 points  Advance Directives (For Healthcare) Does Patient Have a Medical Advance Directive?: Yes Does patient want to make changes to medical advance directive?: Yes (ED - send information to MyChart) Type of Advance Directive: Healthcare Power of Attorney Copy of Healthcare Power of Attorney in Chart?: No - copy requested Copy of Living Will in Chart?: No - copy requested  Reviewed/Updated  Reviewed/Updated: Reviewed All (Medical, Surgical, Family, Medications,  Allergies, Care Teams, Patient Goals)    Allergies (verified) Patient has no known allergies.   Current Medications (verified) Outpatient Encounter Medications as of 10/13/2024  Medication Sig   acetaminophen  (TYLENOL ) 325 MG tablet Take 650 mg by mouth daily.   aspirin  EC 81 MG tablet Take 1 tablet (81 mg total) by mouth daily. Swallow whole.   atorvastatin  (LIPITOR) 10 MG tablet TAKE 1 TABLET BY MOUTH DAILY   docusate sodium (COLACE) 100 MG capsule Take 100 mg by mouth 2 (two) times daily as needed for mild constipation.   donepezil  (ARICEPT ) 5 MG tablet Take 1 tablet (5 mg total) by mouth at bedtime.   memantine  (NAMENDA ) 10 MG tablet TAKE 1 TABLET BY MOUTH TWICE A DAY   sertraline  (ZOLOFT ) 25 MG tablet TAKE 1 TABLET BY MOUTH DAILY   tamsulosin  (FLOMAX ) 0.4 MG CAPS capsule TAKE 1 CAPSULE BY MOUTH DAILY   No facility-administered encounter medications on file as of 10/13/2024.    History: Past Medical History:  Diagnosis Date   Anxiety    Aortic valve disease    Atrial fibrillation (HCC)    BPH (benign prostatic hyperplasia)    Cataract    Chronic neck pain    Complication of anesthesia    passed during surgery - valve replacement 2013 at [facility]   Dementia [facility])    Depression    GERD (gastroesophageal reflux disease)    Heart murmur    History of colonic polyps    History of heavy alcohol consumption    Hyperlipidemia    Hypertension    Osteoarthritis    Persistent atrial fibrillation (HCC)    Polio    Prostate cancer [facility])    Past Surgical History:  Procedure Laterality Date   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   CARDIAC VALVE REPLACEMENT     CARDIOVERSION  09/2016   EYE SURGERY     HERNIA REPAIR  1990   INGUINAL HERNIA REPAIR Right 08/27/2022   Procedure: OPEN RIGHT INGUINAL HERNIA REPAIR WITH MESH;  Surgeon: Curvin Deward MOULD, MD;  [facility];  Service: General;  Laterality: Right;   INSERTION OF MESH Right 08/27/2022   Procedure:  INSERTION OF MESH;  Surgeon: Curvin Deward MOULD, MD;  Location: [facility] OR;  Service: General;  Laterality: Right;   INSERTION PROSTATE RADIATION SEED  2002   Family History  Adopted: Yes  Problem Relation Age of Onset   Alcohol abuse Father    Arthritis Maternal Grandmother    Social History   Occupational History   Occupation: Retired  Tobacco Use   Smoking status: Former    Current packs/day: 0.00    Average packs/day: 3.0 packs/day for 30.0 years (90.0 ttl pk-yrs)    Types: Cigarettes    Start date: 67    Quit date: 1983    Years since quitting: 42.9   Smokeless tobacco: Never   Tobacco comments:    quit smoking 1980  Vaping Use   Vaping status: Never Used  Substance and Sexual Activity   Alcohol use: Not on file    Comment: Stopped drinking several months ago after drinking heavily for majority of lifetime   Drug use: Never   Sexual activity: Not Currently   Tobacco Counseling Counseling given: Not Answered Tobacco comments: quit smoking 1980  SDOH Screenings   Food Insecurity: No Food Insecurity (10/12/2024)  Housing: Unknown (10/12/2024)  Transportation Needs: No Transportation Needs (10/12/2024)  Utilities: Not At Risk (10/16/2023)  Alcohol Screen: Medium Risk (10/16/2023)  Depression (PHQ2-9): High Risk (10/13/2024)  Financial Resource Strain: Low Risk (10/12/2024)  Physical Activity: Insufficiently Active (10/12/2024)  Social Connections: Socially Isolated (10/12/2024)  Stress: Stress Concern Present (10/12/2024)  Tobacco Use: Medium Risk (10/13/2024)  Health Literacy: Inadequate Health Literacy (10/16/2023)   See flowsheets for full screening details  Depression Screen PHQ 2 & 9 Depression Scale- Over the past 2 weeks, how often have you been bothered by any of the following problems? Little interest or pleasure in doing things: 0 Feeling down, depressed, or hopeless (PHQ Adolescent also includes...irritable): 3 PHQ-2 Total Score: 3 Trouble falling or  staying asleep, or sleeping too much: 3 Feeling tired or having little energy: 0 Poor appetite or overeating (PHQ Adolescent also includes...weight loss): 0 Feeling bad about yourself - or that you are a failure or have let yourself or your family down: 3 Trouble concentrating on things, such as reading the newspaper or watching television (PHQ Adolescent also includes...like school work): 3 Moving or speaking so slowly that other people could have noticed. Or the opposite - being so fidgety or restless that you have been moving around a lot more than usual: 3 Thoughts that you would be better off dead, or of hurting yourself in some way: 1 PHQ-9 Total Score: 16 Patient tells me he is not depressed. He reports he has health issues that he has accepted a normal for someone his age and that he is not worried about it or the discomfort or the fact that we all eventually pass on. Reports he has peace with this and is not interested in any intervention. No SI.   Depression Treatment Depression Interventions/Treatment : Currently on Treatment     Goals Addressed   -continue to exercise - her feels this has been the most helpful thing for him. Is walking 1 mile daily. We also talked about chair exercises for hands and arms given he also has arthritic discomfort there.  -in terms of the pain, discussed all options and he is against any invasive interventions or medications. Discussed topical options and over the counter topical options along with exercise (see above.) -discussed a healthy diet and adequate protein and veggies/fruit      Objective:    There were no vitals filed for this visit. There is no height or weight on file to calculate BMI.  Hearing/Vision screen No results found. Immunizations and Health Maintenance -discussed vaccine recs/risks, they are going to check to see if he had the shingles vaccine and know they can get at the pharmacy if wishes to do Health Maintenance  Topic  Date Due   Zoster Vaccines- Shingrix (1 of 2) 01/11/2025 (Originally 05/24/1960)   COVID-19 Vaccine (9 - Moderna risk 2025-26 season) 01/18/2025   Medicare Annual Wellness (AWV)  10/13/2025   DTaP/Tdap/Td (2 - Td or Tdap) 11/04/2033   Pneumococcal Vaccine: 50+ Years  Completed   Influenza Vaccine  Completed   Meningococcal B Vaccine  Aged Out    Discussed shingles vaccine, daughter thinks he may have had it but is not sure. They agree to check.     Assessment/Plan:  This is a routine wellness examination for Marcus Galloway.  Patient Care Team: Theophilus Andrews, Tully GRADE, MD as PCP - General (Internal Medicine) Skeet Juliene SAUNDERS, DO as Consulting Physician (Neurology)  I have personally reviewed and noted the following in the patients chart:   Medical and social history Use of alcohol, tobacco or  illicit drugs  Current medications and supplements including opioid prescriptions. Functional ability and status Nutritional status Physical activity Advanced directives List of other physicians Hospitalizations, surgeries, and ER visits in previous 12 months Vitals Screenings to include cognitive, depression, and falls Referrals and appointments  No orders of the defined types were placed in this encounter.  In addition, I have reviewed and discussed with patient certain preventive protocols, quality metrics, and best practice recommendations. A written personalized care plan for preventive services as well as general preventive health recommendations were provided to patient. See patient instructions.    Chiquita JONELLE Cramp, DO   10/13/2024   No follow-ups on file.  Patient Instructions  I really enjoyed getting to talk with you today! I am available on Tuesdays and Thursdays for virtual visits if you have any questions or concerns, or if I can be of any further assistance.   CHECKLIST FROM ANNUAL WELLNESS VISIT:  -Follow up (please call to schedule if not scheduled after visit):   -yearly for  annual wellness visit with primary care office  Here is a list of your preventive care/health maintenance measures and the plan for each if any are due:  PLAN For any measures below that may be due:   1. Can get the vaccine at the pharmacy if you decide to get it.   Health Maintenance  Topic Date Due   Zoster Vaccines- Shingrix (1 of 2) 01/11/2025 (Originally 05/24/1960)   COVID-19 Vaccine (9 - Moderna risk 2025-26 season) 01/18/2025   Medicare Annual Wellness (AWV)  10/13/2025   DTaP/Tdap/Td (2 - Td or Tdap) 11/04/2033   Pneumococcal Vaccine: 50+ Years  Completed   Influenza Vaccine  Completed   Meningococcal B Vaccine  Aged Out    -See a dentist at least yearly  -Get your eyes checked and then per your eye specialist's recommendations  -Other issues addressed today:   -I have included below further information regarding a healthy whole foods based diet, physical activity guidelines for adults, stress management and opportunities for social connections. I hope you find this information useful.   -----------------------------------------------------------------------------------------------------------------------------------------------------------------------------------------------------------------------------------------------------------    NUTRITION: -eat real food: lots of colorful vegetables (half the plate) and fruits -5-7 servings of vegetables and fruits per day (fresh or steamed is best), exp. 2 servings of vegetables with lunch and dinner and 2 servings of fruit per day. Berries and greens such as kale and collards are great choices.  -consume on a regular basis:  fresh fruits, fresh veggies, fish, nuts, seeds, healthy oils (such as olive oil, avocado oil), whole grains (make sure for bread/pasta/crackers/etc., that the first ingredient on label contains the word whole), legumes. -can eat small amounts of dairy and lean meat (no larger than the palm of your hand),  but avoid processed meats such as ham, bacon, lunch meat, etc. -drink water  -try to avoid fast food and pre-packaged foods, processed meat, ultra processed foods/beverages (donuts, candy, etc.) -most experts advise limiting sodium to < 2300mg  per day, should limit further is any chronic conditions such as high blood pressure, heart disease, diabetes, etc. The American Heart Association advised that < 1500mg  is is ideal -try to avoid foods/beverages that contain any ingredients with names you do not recognize  -try to avoid foods/beverages  with added sugar or sweeteners/sweets  -try to avoid sweet drinks (including diet drinks): soda, juice, Gatorade, sweet tea, power drinks, diet drinks -try to avoid white rice, white bread, pasta (unless whole grain)  EXERCISE GUIDELINES FOR ADULTS: -if  you wish to increase your physical activity, do so gradually and with the approval of your doctor -STOP and seek medical care immediately if you have any chest pain, chest discomfort or trouble breathing when starting or increasing exercise  -move and stretch your body, legs, feet and arms when sitting for long periods -Physical activity guidelines for optimal health in adults: -get at least 150 minutes per week of moderate exercise (can talk, but not sing); this is about 20-30 minutes of sustained activity 5-7 days per week or two 10-15 minute episodes of sustained activity 5-7 days per week -do some muscle building/resistance training/strength training at least 2 days per week  -balance exercises 3+ days per week:   Stand somewhere where you have something sturdy to hold onto if you lose balance    1) lift up on toes, then back down, start with 5x per day and work up to 20x   2) stand and lift one leg straight out to the side so that foot is a few inches of the floor, start with 5x each side and work up to 20x each side   3) stand on one foot, start with 5 seconds each side and work up to 20 seconds on each  side  If you need ideas or help with getting more active:  -Silver sneakers https://tools.silversneakers.com  -Walk with a Doc: Http://www.duncan-williams.com/  -try to include resistance (weight lifting/strength building) and balance exercises twice per week: or the following link for ideas: http://castillo-powell.com/  buyducts.dk  STRESS MANAGEMENT: -can try meditating, or just sitting quietly with deep breathing while intentionally relaxing all parts of your body for 5 minutes daily -if you need further help with stress, anxiety or depression please follow up with your primary doctor or contact the wonderful folks at [facility]: [phone]  SOCIAL CONNECTIONS: -options in [place] if you wish to engage in more social and exercise related activities:  -Silver sneakers https://tools.silversneakers.com  -Walk with a Doc: Http://www.duncan-williams.com/  -Check out the [facility] Active Adults 50+ section on the [place] [employer] companies (hiking clubs, book clubs, cards and games, chess, exercise classes, aquatic classes and much more) - see the website for details: https://www.[place].gov/departments/parks-recreation/active-adults50  -YouTube has lots of exercise videos for different ages and abilities as well  -[facility] (a variety of indoor and outdoor inperson activities for adults). [phone]. [address]  -Virtual Online Classes (a variety of topics): see seniorplanet.org or call [phone]  -consider volunteering at a school, hospice center, church, senior center or elsewhere

## 2024-10-13 NOTE — Patient Instructions (Signed)
 I really enjoyed getting to talk with you today! I am available on Tuesdays and Thursdays for virtual visits if you have any questions or concerns, or if I can be of any further assistance.   CHECKLIST FROM ANNUAL WELLNESS VISIT:  -Follow up (please call to schedule if not scheduled after visit):   -yearly for annual wellness visit with primary care office  Here is a list of your preventive care/health maintenance measures and the plan for each if any are due:  PLAN For any measures below that may be due:   1. Can get the vaccine at the pharmacy if you decide to get it.   Health Maintenance  Topic Date Due   Zoster Vaccines- Shingrix (1 of 2) 01/11/2025 (Originally 05/24/1960)   COVID-19 Vaccine (9 - Moderna risk 2025-26 season) 01/18/2025   Medicare Annual Wellness (AWV)  10/13/2025   DTaP/Tdap/Td (2 - Td or Tdap) 11/04/2033   Pneumococcal Vaccine: 50+ Years  Completed   Influenza Vaccine  Completed   Meningococcal B Vaccine  Aged Out    -See a dentist at least yearly  -Get your eyes checked and then per your eye specialist's recommendations  -Other issues addressed today:   -I have included below further information regarding a healthy whole foods based diet, physical activity guidelines for adults, stress management and opportunities for social connections. I hope you find this information useful.   -----------------------------------------------------------------------------------------------------------------------------------------------------------------------------------------------------------------------------------------------------------    NUTRITION: -eat real food: lots of colorful vegetables (half the plate) and fruits -5-7 servings of vegetables and fruits per day (fresh or steamed is best), exp. 2 servings of vegetables with lunch and dinner and 2 servings of fruit per day. Berries and greens such as kale and collards are great choices.  -consume on a regular  basis:  fresh fruits, fresh veggies, fish, nuts, seeds, healthy oils (such as olive oil, avocado oil), whole grains (make sure for bread/pasta/crackers/etc., that the first ingredient on label contains the word whole), legumes. -can eat small amounts of dairy and lean meat (no larger than the palm of your hand), but avoid processed meats such as ham, bacon, lunch meat, etc. -drink water  -try to avoid fast food and pre-packaged foods, processed meat, ultra processed foods/beverages (donuts, candy, etc.) -most experts advise limiting sodium to < 2300mg  per day, should limit further is any chronic conditions such as high blood pressure, heart disease, diabetes, etc. The American Heart Association advised that < 1500mg  is is ideal -try to avoid foods/beverages that contain any ingredients with names you do not recognize  -try to avoid foods/beverages  with added sugar or sweeteners/sweets  -try to avoid sweet drinks (including diet drinks): soda, juice, Gatorade, sweet tea, power drinks, diet drinks -try to avoid white rice, white bread, pasta (unless whole grain)  EXERCISE GUIDELINES FOR ADULTS: -if you wish to increase your physical activity, do so gradually and with the approval of your doctor -STOP and seek medical care immediately if you have any chest pain, chest discomfort or trouble breathing when starting or increasing exercise  -move and stretch your body, legs, feet and arms when sitting for long periods -Physical activity guidelines for optimal health in adults: -get at least 150 minutes per week of moderate exercise (can talk, but not sing); this is about 20-30 minutes of sustained activity 5-7 days per week or two 10-15 minute episodes of sustained activity 5-7 days per week -do some muscle building/resistance training/strength training at least 2 days per week  -balance exercises 3+ days  per week:   Stand somewhere where you have something sturdy to hold onto if you lose balance    1)  lift up on toes, then back down, start with 5x per day and work up to 20x   2) stand and lift one leg straight out to the side so that foot is a few inches of the floor, start with 5x each side and work up to 20x each side   3) stand on one foot, start with 5 seconds each side and work up to 20 seconds on each side  If you need ideas or help with getting more active:  -Silver sneakers https://tools.silversneakers.com  -Walk with a Doc: Http://www.duncan-williams.com/  -try to include resistance (weight lifting/strength building) and balance exercises twice per week: or the following link for ideas: http://castillo-powell.com/  buyducts.dk  STRESS MANAGEMENT: -can try meditating, or just sitting quietly with deep breathing while intentionally relaxing all parts of your body for 5 minutes daily -if you need further help with stress, anxiety or depression please follow up with your primary doctor or contact the wonderful folks at [facility]: [phone]  SOCIAL CONNECTIONS: -options in [place] if you wish to engage in more social and exercise related activities:  -Silver sneakers https://tools.silversneakers.com  -Walk with a Doc: Http://www.duncan-williams.com/  -Check out the [facility] Active Adults 50+ section on the [place] [employer] (hiking clubs, book clubs, cards and games, chess, exercise classes, aquatic classes and much more) - see the website for details: https://www.[place].gov/departments/parks-recreation/active-adults50  -YouTube has lots of exercise videos for different ages and abilities as well  -[facility] (a variety of indoor and outdoor inperson activities for adults). [phone]. [address]  -Virtual Online Classes (a variety of topics): see seniorplanet.org or call [phone]  -consider volunteering at a school, hospice  center, church, senior center or elsewhere

## 2024-10-28 ENCOUNTER — Other Ambulatory Visit: Payer: Self-pay | Admitting: Internal Medicine

## 2024-10-28 DIAGNOSIS — E781 Pure hyperglyceridemia: Secondary | ICD-10-CM

## 2024-10-28 DIAGNOSIS — F321 Major depressive disorder, single episode, moderate: Secondary | ICD-10-CM

## 2024-11-12 ENCOUNTER — Other Ambulatory Visit: Payer: Self-pay | Admitting: Internal Medicine

## 2024-11-12 DIAGNOSIS — E781 Pure hyperglyceridemia: Secondary | ICD-10-CM

## 2024-11-12 DIAGNOSIS — F321 Major depressive disorder, single episode, moderate: Secondary | ICD-10-CM

## 2024-11-19 ENCOUNTER — Ambulatory Visit: Admitting: Internal Medicine

## 2024-11-19 ENCOUNTER — Encounter: Payer: Self-pay | Admitting: Internal Medicine

## 2024-11-19 ENCOUNTER — Other Ambulatory Visit: Payer: Self-pay | Admitting: Internal Medicine

## 2024-11-19 VITALS — BP 110/68 | HR 58 | Temp 98.3°F | Ht 66.0 in | Wt 157.3 lb

## 2024-11-19 DIAGNOSIS — R419 Unspecified symptoms and signs involving cognitive functions and awareness: Secondary | ICD-10-CM

## 2024-11-19 DIAGNOSIS — Z0000 Encounter for general adult medical examination without abnormal findings: Secondary | ICD-10-CM

## 2024-11-19 DIAGNOSIS — Z8546 Personal history of malignant neoplasm of prostate: Secondary | ICD-10-CM | POA: Diagnosis not present

## 2024-11-19 DIAGNOSIS — E781 Pure hyperglyceridemia: Secondary | ICD-10-CM | POA: Diagnosis not present

## 2024-11-19 DIAGNOSIS — E785 Hyperlipidemia, unspecified: Secondary | ICD-10-CM | POA: Diagnosis not present

## 2024-11-19 DIAGNOSIS — E538 Deficiency of other specified B group vitamins: Secondary | ICD-10-CM

## 2024-11-19 DIAGNOSIS — N401 Enlarged prostate with lower urinary tract symptoms: Secondary | ICD-10-CM

## 2024-11-19 DIAGNOSIS — I509 Heart failure, unspecified: Secondary | ICD-10-CM

## 2024-11-19 DIAGNOSIS — I4821 Permanent atrial fibrillation: Secondary | ICD-10-CM

## 2024-11-19 DIAGNOSIS — I10 Essential (primary) hypertension: Secondary | ICD-10-CM

## 2024-11-19 DIAGNOSIS — E559 Vitamin D deficiency, unspecified: Secondary | ICD-10-CM

## 2024-11-19 LAB — COMPREHENSIVE METABOLIC PANEL WITH GFR
ALT: 11 U/L (ref 3–53)
AST: 16 U/L (ref 5–37)
Albumin: 4 g/dL (ref 3.5–5.2)
Alkaline Phosphatase: 55 U/L (ref 39–117)
BUN: 14 mg/dL (ref 6–23)
CO2: 27 meq/L (ref 19–32)
Calcium: 9.1 mg/dL (ref 8.4–10.5)
Chloride: 106 meq/L (ref 96–112)
Creatinine, Ser: 0.67 mg/dL (ref 0.40–1.50)
GFR: 85.75 mL/min
Glucose, Bld: 91 mg/dL (ref 70–99)
Potassium: 3.9 meq/L (ref 3.5–5.1)
Sodium: 140 meq/L (ref 135–145)
Total Bilirubin: 0.9 mg/dL (ref 0.2–1.2)
Total Protein: 6.2 g/dL (ref 6.0–8.3)

## 2024-11-19 LAB — CBC WITH DIFFERENTIAL/PLATELET
Basophils Absolute: 0.1 K/uL (ref 0.0–0.1)
Basophils Relative: 1.2 % (ref 0.0–3.0)
Eosinophils Absolute: 0.1 K/uL (ref 0.0–0.7)
Eosinophils Relative: 1.8 % (ref 0.0–5.0)
HCT: 36.5 % — ABNORMAL LOW (ref 39.0–52.0)
Hemoglobin: 12.5 g/dL — ABNORMAL LOW (ref 13.0–17.0)
Lymphocytes Relative: 22.9 % (ref 12.0–46.0)
Lymphs Abs: 1.5 K/uL (ref 0.7–4.0)
MCHC: 34.3 g/dL (ref 30.0–36.0)
MCV: 91.8 fl (ref 78.0–100.0)
Monocytes Absolute: 0.4 K/uL (ref 0.1–1.0)
Monocytes Relative: 6.7 % (ref 3.0–12.0)
Neutro Abs: 4.4 K/uL (ref 1.4–7.7)
Neutrophils Relative %: 67.4 % (ref 43.0–77.0)
Platelets: 153 K/uL (ref 150.0–400.0)
RBC: 3.97 Mil/uL — ABNORMAL LOW (ref 4.22–5.81)
RDW: 14.2 % (ref 11.5–15.5)
WBC: 6.5 K/uL (ref 4.0–10.5)

## 2024-11-19 LAB — VITAMIN B12: Vitamin B-12: 228 pg/mL (ref 211–911)

## 2024-11-19 LAB — LIPID PANEL
Cholesterol: 129 mg/dL (ref 28–200)
HDL: 54.4 mg/dL
LDL Cholesterol: 61 mg/dL (ref 10–99)
NonHDL: 74.23
Total CHOL/HDL Ratio: 2
Triglycerides: 65 mg/dL (ref 10.0–149.0)
VLDL: 13 mg/dL (ref 0.0–40.0)

## 2024-11-19 LAB — VITAMIN D 25 HYDROXY (VIT D DEFICIENCY, FRACTURES): VITD: 17.59 ng/mL — ABNORMAL LOW (ref 30.00–100.00)

## 2024-11-19 NOTE — Progress Notes (Signed)
 "    Established Patient Office Visit     CC/Reason for Visit: Annual preventive exam  HPI: Marcus Galloway is a 84 y.o. male who is coming in today for the above mentioned reasons. 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  Here with daughter.  No major concerns.  Overdue for Tdap and shingles vaccines.  No longer wishes to do colonoscopy due to age and comorbidities.   Past Medical/Surgical History: Past Medical History:  Diagnosis Date   Anxiety    Aortic valve disease    Atrial fibrillation (HCC)    BPH (benign prostatic hyperplasia)    Cataract    Chronic neck pain    Complication of anesthesia    passed during surgery - valve replacement 2013 at [facility]   Dementia [facility])    Depression    GERD (gastroesophageal reflux disease)    Heart murmur    History of colonic polyps    History of heavy alcohol consumption    Hyperlipidemia    Hypertension    Osteoarthritis    Persistent atrial fibrillation (HCC)    Polio    Prostate cancer [facility])     Past Surgical History:  Procedure Laterality Date   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   CARDIAC VALVE REPLACEMENT     CARDIOVERSION  09/2016   EYE SURGERY     HERNIA REPAIR  1990   INGUINAL HERNIA REPAIR Right 08/27/2022   Procedure: OPEN RIGHT INGUINAL HERNIA REPAIR WITH MESH;  Surgeon: Curvin Deward MOULD, MD;  Location: [facility] OR;  Service: General;  Laterality: Right;   INSERTION OF MESH Right 08/27/2022   Procedure: INSERTION OF MESH;  Surgeon: Curvin Deward MOULD, MD;  Location: [facility] OR;  Service: General;  Laterality: Right;   INSERTION PROSTATE RADIATION SEED  2002    Social History:  reports that he quit smoking about 43 years ago. His smoking use included cigarettes. He started smoking about 73 years ago. He has a 90 pack-year smoking history.  He has never used smokeless tobacco. He reports that he does not use drugs. No history on file for alcohol use.  Allergies: Allergies[1]  Family History:  Family History  Adopted: Yes  Problem Relation Age of Onset   Alcohol abuse Father    Arthritis Maternal Grandmother     Current Medications[2]  Review of Systems:  Negative unless indicated in HPI.   Physical Exam: Vitals:   11/19/24 1308  BP: 110/68  Pulse: (!) 58  Temp: 98.3 F (36.8 C)  TempSrc: Oral  SpO2: 98%  Weight: 157 lb 4.8 oz (71.4 kg)  Height: 5' 6 (1.676 m)    Body mass index is 25.39 kg/m.   Physical Exam Vitals reviewed.  Constitutional:      General: He is not in acute distress.    Appearance: Normal appearance. He is not ill-appearing, toxic-appearing or diaphoretic.  HENT:     Head: Normocephalic.     Right Ear: Tympanic membrane, ear canal and external ear normal. There is no impacted cerumen.     Left Ear: Tympanic membrane, ear canal and external ear normal. There is no impacted cerumen.     Nose: Nose normal.     Mouth/Throat:     Mouth: Mucous membranes are moist.     Pharynx: Oropharynx is clear. No oropharyngeal exudate or posterior oropharyngeal erythema.  Eyes:     General: No scleral icterus.       Right eye: No discharge.        Left eye: No discharge.     Conjunctiva/sclera: Conjunctivae normal.     Pupils: Pupils are equal, round, and reactive to light.  Neck:     Vascular: No carotid bruit.  Cardiovascular:     Rate and Rhythm: Normal rate and regular rhythm.     Pulses: Normal pulses.     Heart sounds: Normal heart sounds.  Pulmonary:     Effort: Pulmonary effort is normal. No respiratory distress.     Breath sounds: Normal breath sounds.  Abdominal:     General: Abdomen is flat. Bowel sounds are normal.     Palpations: Abdomen is soft.  Musculoskeletal:        General: Normal range of motion.     Cervical back: Normal range of motion.  Skin:    General: Skin  is warm and dry.  Neurological:     General: No focal deficit present.     Mental Status: He is alert and oriented to person, place, and time. Mental status is at baseline.  Psychiatric:        Mood and Affect: Mood normal.        Behavior: Behavior normal.        Thought Content: Thought content normal.        Judgment: Judgment normal.      Impression and Plan:  Pure hyperglyceridemia -     Lipid panel; Future  Encounter for preventive health examination -     Do not attempt resuscitation (DNR)  Primary hypertension -     CBC with Differential/Platelet; Future -     Comprehensive metabolic panel with GFR; Future  Vitamin D  deficiency -     VITAMIN D  25 Hydroxy (Vit-D Deficiency, Fractures); Future  Vitamin B12 deficiency -     Vitamin B12; Future  Hyperlipidemia, unspecified hyperlipidemia type  Congestive heart failure, NYHA class 2, unspecified congestive heart failure type (HCC)  Permanent atrial fibrillation (HCC)  Personal history of prostate cancer   -Recommend routine eye and dental care. -Healthy lifestyle discussed in detail. -Labs to be updated today. -Prostate cancer screening: PSA today Health Maintenance  Topic Date Due   Zoster (Shingles) Vaccine (1 of 2) 01/11/2025*   COVID-19 Vaccine (9 - Moderna risk 2025-26 season) 01/18/2025   Medicare Annual Wellness Visit  10/13/2025   DTaP/Tdap/Td vaccine (2 - Td or Tdap) 11/04/2033   Pneumococcal Vaccine for age over 84  Completed   Flu Shot  Completed   Meningitis B Vaccine  Aged Out  *Topic was postponed. The date shown is not the original due date.     [facility] DNR form placed in chart after discussion with patient and daughter about wishes, we will make him DNR in chart as well.     Tully Theophilus Andrews, MD [facility]     [1] No Known Allergies [2]  Current Outpatient Medications:    acetaminophen  (TYLENOL ) 325 MG tablet, Take 650 mg by mouth daily., Disp: ,  Rfl:    aspirin  EC 81 MG tablet, Take 1 tablet (81 mg total) by mouth daily. Swallow whole., Disp: 90 tablet, Rfl: 3   atorvastatin  (LIPITOR) 10 MG tablet, TAKE 1 TABLET BY MOUTH DAILY, Disp: 90 tablet, Rfl: 0   docusate sodium (COLACE) 100 MG capsule, Take 100 mg by mouth 2 (two) times daily  as needed for mild constipation., Disp: , Rfl:    donepezil  (ARICEPT ) 5 MG tablet, Take 1 tablet (5 mg total) by mouth at bedtime., Disp: 90 tablet, Rfl: 1   sertraline  (ZOLOFT ) 25 MG tablet, TAKE 1 TABLET BY MOUTH DAILY, Disp: 90 tablet, Rfl: 0   memantine  (NAMENDA ) 10 MG tablet, TAKE 1 TABLET BY MOUTH 2 TIMES A DAY, Disp: 180 tablet, Rfl: 1   tamsulosin  (FLOMAX ) 0.4 MG CAPS capsule, TAKE 1 CAPSULE BY MOUTH DAILY, Disp: 90 capsule, Rfl: 1  "

## 2024-11-24 ENCOUNTER — Ambulatory Visit: Payer: Self-pay | Admitting: Internal Medicine

## 2024-11-24 DIAGNOSIS — E559 Vitamin D deficiency, unspecified: Secondary | ICD-10-CM

## 2024-11-24 MED ORDER — VITAMIN D (ERGOCALCIFEROL) 1.25 MG (50000 UNIT) PO CAPS: 50000.0000 [IU] | ORAL_CAPSULE | ORAL | 0 refills | Status: AC

## 2024-11-30 ENCOUNTER — Ambulatory Visit (HOSPITAL_BASED_OUTPATIENT_CLINIC_OR_DEPARTMENT_OTHER): Admitting: Cardiology

## 2025-02-08 ENCOUNTER — Ambulatory Visit: Admitting: Neurology
